# Patient Record
Sex: Female | Born: 1980 | Race: White | Hispanic: No | Smoking: Current some day smoker
Health system: Southern US, Community
[De-identification: ages and names within clinical notes are randomized; demographics above are authoritative.]

## PROBLEM LIST (undated history)

## (undated) DIAGNOSIS — G43909 Migraine, unspecified, not intractable, without status migrainosus: Secondary | ICD-10-CM

## (undated) DIAGNOSIS — F32A Depression, unspecified: Secondary | ICD-10-CM

## (undated) DIAGNOSIS — I10 Essential (primary) hypertension: Secondary | ICD-10-CM

## (undated) HISTORY — DX: Essential (primary) hypertension: I10

## (undated) HISTORY — PX: CHOLECYSTECTOMY: SHX55

## (undated) HISTORY — PX: OTHER SURGICAL HISTORY: SHX169

---

## 2005-12-24 ENCOUNTER — Ambulatory Visit: Payer: Self-pay | Admitting: Family Medicine

## 2006-04-20 ENCOUNTER — Encounter: Payer: Self-pay | Admitting: Family Medicine

## 2006-04-20 LAB — CONVERTED CEMR LAB

## 2006-04-27 ENCOUNTER — Ambulatory Visit: Payer: Self-pay | Admitting: Family Medicine

## 2006-06-28 DIAGNOSIS — K219 Gastro-esophageal reflux disease without esophagitis: Secondary | ICD-10-CM

## 2006-06-28 DIAGNOSIS — K2900 Acute gastritis without bleeding: Secondary | ICD-10-CM | POA: Insufficient documentation

## 2006-06-28 DIAGNOSIS — E669 Obesity, unspecified: Secondary | ICD-10-CM

## 2006-06-28 DIAGNOSIS — J309 Allergic rhinitis, unspecified: Secondary | ICD-10-CM | POA: Insufficient documentation

## 2007-05-08 ENCOUNTER — Encounter: Payer: Self-pay | Admitting: Family Medicine

## 2007-11-15 ENCOUNTER — Ambulatory Visit: Payer: Self-pay | Admitting: Family Medicine

## 2007-11-15 DIAGNOSIS — R300 Dysuria: Secondary | ICD-10-CM

## 2007-11-15 DIAGNOSIS — G43909 Migraine, unspecified, not intractable, without status migrainosus: Secondary | ICD-10-CM

## 2007-11-15 LAB — CONVERTED CEMR LAB
Bilirubin Urine: NEGATIVE
Nitrite: NEGATIVE
pH: 6

## 2007-11-16 LAB — CONVERTED CEMR LAB: Clue Cells Wet Prep HPF POC: NONE SEEN

## 2008-01-12 ENCOUNTER — Ambulatory Visit: Payer: Self-pay | Admitting: Family Medicine

## 2008-01-15 ENCOUNTER — Telehealth: Payer: Self-pay | Admitting: Family Medicine

## 2009-01-01 ENCOUNTER — Ambulatory Visit: Payer: Self-pay | Admitting: Family Medicine

## 2009-01-01 DIAGNOSIS — R635 Abnormal weight gain: Secondary | ICD-10-CM

## 2009-01-14 LAB — CONVERTED CEMR LAB
BUN: 14 mg/dL (ref 6–23)
Chloride: 104 meq/L (ref 96–112)
Potassium: 4.1 meq/L (ref 3.5–5.3)

## 2009-02-03 ENCOUNTER — Ambulatory Visit: Payer: Self-pay | Admitting: Family Medicine

## 2009-02-10 ENCOUNTER — Telehealth: Payer: Self-pay | Admitting: Family Medicine

## 2009-03-05 ENCOUNTER — Ambulatory Visit: Payer: Self-pay | Admitting: Family Medicine

## 2009-03-05 DIAGNOSIS — J029 Acute pharyngitis, unspecified: Secondary | ICD-10-CM

## 2009-03-05 LAB — CONVERTED CEMR LAB: Rapid Strep: NEGATIVE

## 2009-04-03 ENCOUNTER — Telehealth: Payer: Self-pay | Admitting: Family Medicine

## 2009-08-04 ENCOUNTER — Ambulatory Visit: Payer: Self-pay | Admitting: Family Medicine

## 2009-09-22 ENCOUNTER — Ambulatory Visit: Payer: Self-pay | Admitting: Family Medicine

## 2009-09-22 DIAGNOSIS — J1189 Influenza due to unidentified influenza virus with other manifestations: Secondary | ICD-10-CM

## 2009-11-05 ENCOUNTER — Ambulatory Visit: Payer: Self-pay | Admitting: Family Medicine

## 2009-12-03 ENCOUNTER — Ambulatory Visit: Payer: Self-pay | Admitting: Family Medicine

## 2009-12-03 DIAGNOSIS — L659 Nonscarring hair loss, unspecified: Secondary | ICD-10-CM | POA: Insufficient documentation

## 2009-12-31 ENCOUNTER — Ambulatory Visit: Payer: Self-pay | Admitting: Family Medicine

## 2009-12-31 ENCOUNTER — Telehealth: Payer: Self-pay | Admitting: Family Medicine

## 2010-01-07 ENCOUNTER — Ambulatory Visit: Payer: Self-pay | Admitting: Family Medicine

## 2010-01-23 ENCOUNTER — Ambulatory Visit: Payer: Self-pay | Admitting: Family Medicine

## 2010-02-04 ENCOUNTER — Ambulatory Visit: Payer: Self-pay | Admitting: Family Medicine

## 2010-06-10 ENCOUNTER — Ambulatory Visit: Payer: Self-pay | Admitting: Family Medicine

## 2010-10-20 NOTE — Progress Notes (Signed)
Summary: Phentermine refill  Phone Note Refill Request   Refills Requested: Medication #1:  PHENTERMINE HCL 37.5 MG CAPS 1 capsule by mouth qAM on an empty stomach. Initial call taken by: Payton Spark CMA,  December 31, 2009 3:22 PM  Follow-up for Phone Call        NEEDS NURSE VISIT. Follow-up by: Seymour Bars DO,  December 31, 2009 3:23 PM     Appended Document: Phentermine refill Pt aware

## 2010-10-20 NOTE — Assessment & Plan Note (Signed)
Summary: flu   Vital Signs:  Patient profile:   30 year old female Height:      61 inches Weight:      164 pounds BMI:     31.10 O2 Sat:      100 % on Room air Temp:     98.3 degrees F oral Pulse rate:   77 / minute BP sitting:   130 / 78  (left arm) Cuff size:   regular  Vitals Entered By: Payton Spark CMA (September 22, 2009 1:20 PM)  O2 Flow:  Room air CC: HA, fever, productive cough, runny nose and achy x 4 days.    Primary Care Provider:  Seymour Bars  CC:  HA, fever, productive cough, and runny nose and achy x 4 days. Marland Kitchen  History of Present Illness: 30 yo HF presents for 4 days of fever, bodyaches, runny nose and cough.  She did not get a flu shot.  No GI upset.  No rash.  She has not had a sore throat.  She has maxillary facial pain.  She has taken Tylenol, Dayquil and Nyquil which has helped.  She is not sleeping well.  She usually just has spring allergies and denies any preceeding congestion.  Her sister had the flu a couple wks ago.      Current Medications (verified): 1)  Seasonale 0.15-0.03 Mg Tabs (Levonorgest-Eth Estrad 91-Day) .Marland Kitchen.. 1 Tab By Mouth Daily As Directed 2)  Topiramate 50 Mg Tabs (Topiramate) .Marland Kitchen.. 1 Tab By Mouth Q Pm 3)  Phentermine Hcl 37.5 Mg Caps (Phentermine Hcl) .Marland Kitchen.. 1 Capsule By Mouth Qam On An Empty Stomach  Allergies (verified): No Known Drug Allergies  Past History:  Past Medical History: Reviewed history from 01/01/2009 and no changes required. G1P1 NSVD spring allergies  Social History: Conservation officer, nature.  Recently moved from [place].  Married to [employer]. (he lives in [place]) Has 83 yo son.  Nonsmoker.  Does not exercise.    works as a Oceanographer.  Goes back and forth between [place] and [place].  Review of Systems      See HPI  Physical Exam  General:  alert, well-developed, well-nourished, and well-hydrated.   Head:  normocephalic and atraumatic.  sinuses NTTP Eyes:  conjunctiva clear Ears:  EACs patent; TMs translucent and gray  with good cone of light and bony landmarks.  Nose:  nasal congestion present Mouth:  throat mildly injected no tonsilar hypertrophy, exudates or vesicles Lungs:  Normal respiratory effort, chest expands symmetrically. Lungs are clear to auscultation, no crackles or wheezes. dry cough. Heart:  Normal rate and regular rhythm. S1 and S2 normal without gallop, murmur, click, rub or other extra sounds. Skin:  color normal and no rashes.   Cervical Nodes:  shotty anterior cervical chain LA   Impression & Recommendations:  Problem # 1:  INFLUENZA (ICD-487.8) Day 4 of influenza, uncomplicated. Treat with supportive care.  Avoid contact spread. 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 Clear fluids, OTC cold/ flu meds. Call if not improved by Friday AM.  Complete Medication List: 1)  Seasonale 0.15-0.03 Mg Tabs (Levonorgest-eth estrad 91-day) .Marland Kitchen.. 1 tab by mouth daily as directed 2)  Topiramate 50 Mg Tabs (Topiramate) .Marland Kitchen.. 1 tab by mouth q pm 3)  Phentermine Hcl 37.5 Mg Caps (Phentermine hcl) .Marland Kitchen.. 1 capsule by mouth qam on an empty stomach  Patient Instructions: 1)  Treat the Flu with Tylenol Cold and Flu. 2)  Add Ibuprofen 600 mg 3 x a day as needed  for bodyaches. 3)  Increase clear fluids. 4)  Rest. 5)  Call if not improved in 4-5 days.

## 2010-10-20 NOTE — Assessment & Plan Note (Signed)
Summary: weight/ allergies   Vital Signs:  Patient profile:   30 year old female Height:      61 inches Weight:      155 pounds BMI:     29.39 O2 Sat:      100 % on Room air Pulse rate:   86 / minute BP sitting:   118 / 72  (left arm) Cuff size:   regular  Vitals Entered By: Payton Spark CMA (Feb 04, 2010 10:48 AM)  O2 Flow:  Room air CC: F/U weight. Also c/o allergies   Primary Care Provider:  Seymour Bars  CC:  F/U weight. Also c/o allergies.  History of Present Illness: 30 yo HF presents for f/u weight management.  Her weight has been stable.  She is off Phentermine x 1 wk now.  She has really not been working on diet or exercise.  She noticed that it did help her a lot with hunger and portion control.  Her goal weight is 130.    For her allergies.  She is not taking anything.  She is due for fasting labs.      Current Medications (verified): 1)  Seasonale 0.15-0.03 Mg Tabs (Levonorgest-Eth Estrad 91-Day) .Marland Kitchen.. 1 Tab By Mouth Daily As Directed 2)  Phentermine Hcl 37.5 Mg Tabs (Phentermine Hcl) .Marland Kitchen.. 1 Tab By Mouth Qam, Take 30 Min Before Breakfast  Allergies (verified): No Known Drug Allergies  Past History:  Past Medical History: Reviewed history from 01/01/2009 and no changes required. G1P1 NSVD spring allergies  Social History: Reviewed history from 12/03/2009 and no changes required. 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  Married to [employer]. (he lives in [place]) Has 42 yo son.  Nonsmoker.  Does not exercise.      Goes back and forth between [place] and [place].  Review of Systems      See HPI  Physical Exam  General:  alert, well-developed, well-nourished, well-hydrated, and overweight-appearing.   Head:  normocephalic and atraumatic.   Eyes:  conjunctiva clear Nose:  no nasal discharge.   Mouth:  good dentition and pharynx pink and moist.   Neck:  no masses.   Lungs:  Normal respiratory effort, chest expands  symmetrically. Lungs are clear to auscultation, no crackles or wheezes. Heart:  Normal rate and regular rhythm. S1 and S2 normal without gallop, murmur, click, rub or other extra sounds. Neurologic:  no tremor Skin:  color normal.   Cervical Nodes:  No lymphadenopathy noted Psych:  good eye contact, not anxious appearing, and not depressed appearing.     Impression & Recommendations:  Problem # 1:  WEIGHT GAIN (ICD-783.1) Assessment Improved She plans to restart a healthy diet with regular exercise.  wants to give Phentermine one more month to try to move her BMI towards 27 (at 29 now).  Will update her labs and discussed a plan for healthy wt loss. Orders: T-TSH ([id number])  Problem # 2:  ALLERGIC RHINITIS (ICD-477.9) OK to use OTC Allegra thru spring.  Complete Medication List: 1)  Seasonale 0.15-0.03 Mg Tabs (Levonorgest-eth estrad 91-day) .Marland Kitchen.. 1 tab by mouth daily as directed 2)  Phentermine Hcl 37.5 Mg Tabs (Phentermine hcl) .Marland Kitchen.. 1 tab by mouth qam, take 30 min before breakfast  Other Orders: T-Lipid Profile ([id number]) T-Comprehensive Metabolic Panel ([id number])  Patient Instructions: 1)  Return for a nurse visit - Wt / BP check in1 month. 2)  Update fasting labs. 3)  Will call you w/r  esults. Prescriptions: PHENTERMINE HCL 37.5 MG TABS (PHENTERMINE HCL) 1 tab by mouth qAM, take 30 min before breakfast  #30 x 0   Entered and Authorized by:   Seymour Bars DO   Signed by:   Seymour Bars DO on 02/04/2010   Method used:   Printed then faxed to ...       [facility]  [address] [phone]* (retail)       [address] [place], [place]  [address]       Ph: [phone] or [phone]       Fax: [phone]   RxID:   [id number]

## 2010-10-20 NOTE — Letter (Signed)
Summary: Out of Work  [facility]  [address]   [place], [place] [address]   Phone: [phone]  Fax: [phone]    September 22, 2009   Employee:  Connie Carlson    To Whom It May Concern:   For Medical reasons, please excuse the above named employee from work for the following dates:  Start:   Jan 3-4  End:   Jan 5  If you need additional information, please feel free to contact our office.         Sincerely,    Seymour Bars DO

## 2010-10-20 NOTE — Assessment & Plan Note (Signed)
Summary: BP CHECK  Nurse Visit   Vital Signs:  Patient profile:   30 year old female Height:      61 inches Weight:      153 pounds BMI:     29.01 Pulse rate:   81 / minute BP sitting:   136 / 76  (left arm) Cuff size:   regular  Vitals Entered By: Payton Spark CMA (Jan 23, 2010 8:47 AM) CC: Weight and BP check   Allergies: No Known Drug Allergies  Orders Added: 1)  Est. Patient Level I [99211]   Only down 2 lbs and BP a little elevated form last time but not over 140.  Keep scheduled appt. May  6, 201110:00 AM [place] MD, Santina Evans

## 2010-10-20 NOTE — Assessment & Plan Note (Signed)
Summary: f/u wt   Vital Signs:  Patient profile:   30 year old female Height:      61 inches Weight:      173 pounds BMI:     32.81 O2 Sat:      100 % on Room air Pulse rate:   77 / minute BP sitting:   117 / 74  (left arm) Cuff size:   regular  Vitals Entered By: Payton Spark CMA (June 10, 2010 1:15 PM)  O2 Flow:  Room air CC: F/U weight and refill phentermine   Primary Care Provider:  Seymour Bars DO  CC:  F/U weight and refill phentermine.  History of Present Illness: 30 yo WF presents for f/u weight.  She went to [place] for 6 wks and gained wt.  Admits to eating a lot.  She wants to get the weight off again.  She had done really well on Phentermine in the past.  Has been off of it for a while.  Due for fasting labs.    O/W feels fine.  Plans to start her healthy diet and exercise program back up.    Current Medications (verified): 1)  Seasonale 0.15-0.03 Mg Tabs (Levonorgest-Eth Estrad 91-Day) .Marland Kitchen.. 1 Tab By Mouth Daily As Directed 2)  Phentermine Hcl 37.5 Mg Tabs (Phentermine Hcl) .Marland Kitchen.. 1 Tab By Mouth Qam, Take 30 Min Before Breakfast  Allergies (verified): No Known Drug Allergies  Past History:  Past Medical History: Reviewed history from 01/01/2009 and no changes required. G1P1 NSVD spring allergies  Social History: Reviewed history from 12/03/2009 and no changes required. 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  Married to [employer]. (he lives in [place]) Has 17 yo son.  Nonsmoker.  Does not exercise.      Goes back and forth between [place] and [place].  Review of Systems      See HPI  Physical Exam  General:  alert, well-developed, well-nourished, well-hydrated, and overweight-appearing.   Head:  normocephalic and atraumatic.   Neck:  no masses.   Lungs:  Normal respiratory effort, chest expands symmetrically. Lungs are clear to auscultation, no crackles or wheezes. Heart:  Normal rate and regular rhythm. S1 and S2  normal without gallop, murmur, click, rub or other extra sounds. Skin:  color normal.   Psych:  good eye contact, not anxious appearing, and not depressed appearing.     Impression & Recommendations:  Problem # 1:  WEIGHT GAIN (ICD-783.1) 18 lbs weight gain since last visit, off Phentermine and after a long trip [place]'. She would like to go back on Phentermine to get her BMI down <30.  She is 32.8 today. Will update her fasting labs including her TSH this wk and f/u results.  REviewed plans for healthy diet, regular exercise and will add Phentermine back on board since she did well on it before and has excellent BP.  RTC for nurse BP/ WT check in 4 wks. Orders: T-TSH ([id number])  Complete Medication List: 1)  Seasonale 0.15-0.03 Mg Tabs (Levonorgest-eth estrad 91-day) .Marland Kitchen.. 1 tab by mouth daily as directed 2)  Phentermine Hcl 37.5 Mg Tabs (Phentermine hcl) .Marland Kitchen.. 1 tab by mouth qam, take 30 min before breakfast  Other Orders: T-Comprehensive Metabolic Panel ([id number]) T-Lipid Profile ([id number])  Patient Instructions: 1)  Restart Phentermine in the AM as your appetite suppresant. 2)  Work on [employer] - high protein, low carb. 3)  Exercise goal is 45+ min 4-5  days/ wk. 4)  Plan ahead with traveling for work. 5)  Call if any problems. 6)  Update fasting labs downstairs one morning and I will call you w/ the results. 7)  REturn for nurse BP/ WT check in 1 month. Prescriptions: PHENTERMINE HCL 37.5 MG TABS (PHENTERMINE HCL) 1 tab by mouth qAM, take 30 min before breakfast  #30 x 0   Entered and Authorized by:   Seymour Bars DO   Signed by:   Seymour Bars DO on 06/10/2010   Method used:   Printed then faxed to ...       [facility]  [address] [phone]* (retail)       [address] [place], [place]  [address]       Ph: [phone] or [phone]       Fax: [phone]   RxID:   [id number]

## 2010-10-20 NOTE — Assessment & Plan Note (Signed)
Summary: weight and bp//mpm  Nurse Visit   Vital Signs:  Patient profile:   30 year old female Height:      61 inches Weight:      155 pounds BMI:     29.39 O2 Sat:      100 % on Room air Pulse rate:   59 / minute BP sitting:   110 / 71  (left arm) Cuff size:   regular  Vitals Entered By: Payton Spark CMA (December 31, 2009 4:00 PM)  O2 Flow:  Room air CC: Pt would like to go back on the tabs bc they cheaper and Pt feels they work better for her.    Allergies: No Known Drug Allergies  Orders Added: 1)  Est. Patient Level I [16109] Prescriptions: PHENTERMINE HCL 37.5 MG TABS (PHENTERMINE HCL) 1 tab by mouth qAM, take 30 min before breakfast  #30 x 0   Entered and Authorized by:   Seymour Bars DO   Signed by:   Seymour Bars DO on 12/31/2009   Method used:   Printed then faxed to ...       [facility]  [address] [phone]* (retail)       [address] [place], [place]  [address]       Ph: [phone] or [phone]       Fax: [phone]   RxID:   [id number]    CC: Follow-up HTN HPI: Taking meds? yes Side effects? no Chest pain, SOB, Dizziness? no A/P: HTN (401.1) At goal? If no, Patient will be notified.  5 minutes was spent with patient.   Appended Document: weight and bp//mpm faxed

## 2010-10-20 NOTE — Assessment & Plan Note (Signed)
Summary: f/u weight managment   Vital Signs:  Patient profile:   30 year old female Height:      61 inches Weight:      160 pounds BMI:     30.34 O2 Sat:      99 % on Room air Pulse rate:   68 / minute BP sitting:   130 / 82  (left arm) Cuff size:   regular  Vitals Entered By: Payton Spark CMA (December 03, 2009 3:31 PM)  O2 Flow:  Room air CC: Weight check. Phentermine refill. Also c/o hair loss.    Primary Care Provider:  Seymour Bars  CC:  Weight check. Phentermine refill. Also c/o hair loss. Marland Kitchen  History of Present Illness: 30 yo HF presents for f/u weight managment.  Doing well but stopped phentermine about 3 wks ago when RX ran out.  She has continued to lose wt, another 4 lbs since last OV.  Denies adverse SE.  Eating healthier and snacking less. Still working on her late night eating habits.  Has not been exercising.    Has had hair loss, diffuse over the past 2 yrs.  Had a normal thyroid test in April of last year.  Denies any other changes.  Has good energy level and not problems with heat or cold.  Has some chronic constipation problems.  Does not eat vegetables but tries to drink more water and eat fiber one bars.  Current Medications (verified): 1)  Seasonale 0.15-0.03 Mg Tabs (Levonorgest-Eth Estrad 91-Day) .Marland Kitchen.. 1 Tab By Mouth Daily As Directed 2)  Phentermine Hcl 37.5 Mg Caps (Phentermine Hcl) .Marland Kitchen.. 1 Capsule By Mouth Qam On An Empty Stomach  Allergies (verified): No Known Drug Allergies  Past History:  Past Medical History: Reviewed history from 01/01/2009 and no changes required. 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  Married to [employer]. (he lives in [place]) Has 56 yo son.  Nonsmoker.  Does not exercise.      Goes back and forth between [place] and [place].  Review of Systems      See HPI  Physical Exam  General:  alert, well-developed, well-nourished, well-hydrated, and  overweight-appearing.   Head:  normocephalic and atraumatic.  no alopecia Mouth:  pharynx pink and moist.   Neck:  no masses.   Lungs:  Normal respiratory effort, chest expands symmetrically. Lungs are clear to auscultation, no crackles or wheezes. Heart:  Normal rate and regular rhythm. S1 and S2 normal without gallop, murmur, click, rub or other extra sounds. Abdomen:  Bowel sounds positive,abdomen soft and non-tender without masses, organomegaly or hernias noted. Extremities:  no LE edema Skin:  color normal and no rashes.   Psych:  good eye contact, not anxious appearing, and not depressed appearing.     Impression & Recommendations:  Problem # 1:  WEIGHT GAIN (ICD-783.1) BMI 30, down another 4 lbs.  Will RF phentermine since it is helping and she has not had any adverse SEs.   She will work on [employer] and regular exercise.    Problem # 2:  HAIR LOSS (ICD-704.00) Info given to pt on natural ways to promote hair growth by diet and biotin. Recheck Thyroid with labs in April.  Complete Medication List: 1)  Seasonale 0.15-0.03 Mg Tabs (Levonorgest-eth estrad 91-day) .Marland Kitchen.. 1 tab by mouth daily as directed 2)  Phentermine Hcl 37.5 Mg Caps (Phentermine hcl) .Marland Kitchen.. 1 capsule by mouth  qam on an empty stomach  Patient Instructions: 1)  RFd Phentermine for wt loss. 2)  Work on [employer] and 1 hr of exercise 4-5 days/ wk. 3)  Return for f/u with fasting labs in 2 mos. Prescriptions: PHENTERMINE HCL 37.5 MG CAPS (PHENTERMINE HCL) 1 capsule by mouth qAM on an empty stomach  #30 x 0   Entered and Authorized by:   Seymour Bars DO   Signed by:   Seymour Bars DO on 12/03/2009   Method used:   Printed then faxed to ...       [facility]  [address] [phone]* (retail)       [address] [place], [place]  [address]       Ph: [phone] or [phone]       Fax: [phone]   RxID:   [id number]

## 2010-11-17 ENCOUNTER — Ambulatory Visit: Payer: Self-pay | Admitting: Family Medicine

## 2010-11-18 ENCOUNTER — Encounter: Payer: Self-pay | Admitting: Family Medicine

## 2010-11-18 ENCOUNTER — Ambulatory Visit (INDEPENDENT_AMBULATORY_CARE_PROVIDER_SITE_OTHER): Payer: Private Health Insurance - Indemnity | Admitting: Family Medicine

## 2010-11-18 DIAGNOSIS — N912 Amenorrhea, unspecified: Secondary | ICD-10-CM

## 2010-11-18 DIAGNOSIS — E669 Obesity, unspecified: Secondary | ICD-10-CM

## 2010-11-18 DIAGNOSIS — R209 Unspecified disturbances of skin sensation: Secondary | ICD-10-CM | POA: Insufficient documentation

## 2010-11-19 LAB — CONVERTED CEMR LAB
ALT: 22 units/L (ref 0–35)
Albumin: 4.3 g/dL (ref 3.5–5.2)
CO2: 25 meq/L (ref 19–32)
Calcium: 9.2 mg/dL (ref 8.4–10.5)
Chloride: 103 meq/L (ref 96–112)
Cholesterol: 155 mg/dL (ref 0–200)
Potassium: 3.8 meq/L (ref 3.5–5.3)
Sodium: 138 meq/L (ref 135–145)
Total Protein: 7.3 g/dL (ref 6.0–8.3)
Vitamin B-12: 698 pg/mL (ref 211–911)
hCG, Beta Chain, Quant, S: <2 milliintl units/mL

## 2010-11-26 NOTE — Assessment & Plan Note (Signed)
Summary: WT/ labs   Vital Signs:  Patient profile:   30 year old female Height:      61 inches Weight:      187 pounds BMI:     35.46 O2 Sat:      99 % on Room air Pulse rate:   92 / minute BP sitting:   132 / 79  (left arm) Cuff size:   large  Vitals Entered By: Payton Spark CMA (November 18, 2010 11:12 AM)  O2 Flow:  Room air CC: Discuss OCPs and phentermine.   Primary Care Provider:  Seymour Bars DO  CC:  Discuss OCPs and phentermine.Marland Kitchen  History of Present Illness: 30 yo HF presents for f/u weight.  She stopped Phentermine the end of October and she has gained 15 lbs since.  She plans to get back on track with her diet and exercise but wants to get back on Phentermine since she did so well on it last year w/o complications.  She did well on 1st 3 mos pack of Seasonale.  Completed this in early Jan but never started pack #2 b/c her period did not come during the wk of placebo pills.  She has had unprotected sex during this time.  She has not had a period in about 6 mos.  Hx of irreg periods.  Did 2 home pregnancy tests and both came back neg.  Denies any symptoms of pregnancy and wonders what she should do with her pills.      Current Medications (verified): 1)  Seasonale 0.15-0.03 Mg Tabs (Levonorgest-Eth Estrad 91-Day) .Marland Kitchen.. 1 Tab By Mouth Daily As Directed 2)  Phentermine Hcl 37.5 Mg Tabs (Phentermine Hcl) .Marland Kitchen.. 1 Tab By Mouth Qam, Take 30 Min Before Breakfast  Allergies (verified): No Known Drug Allergies  Past History:  Past Medical History: G1P1 NSVD spring allergies obesity  Past Surgical History: Reviewed history from 06/28/2006 and no changes required. Cholecystectomy  Social History: Reviewed history from 12/03/2009 and no changes required. 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  Married to [employer]. (he lives in [place]) Has 31 yo son.  Nonsmoker.  Does not exercise.      Goes back and forth between [place] and  [place].  Review of Systems       The patient complains of weight gain.  The patient denies chest pain.         + positional L foot paresthesias.  no palpitions or insomnia   Physical Exam  General:  alert and well-developed.   Mouth:  pharynx pink and moist.   Neck:  no masses and no thyromegaly.   Lungs:  Normal respiratory effort, chest expands symmetrically. Lungs are clear to auscultation, no crackles or wheezes. Heart:  Normal rate and regular rhythm. S1 and S2 normal without gallop, murmur, click, rub or other extra sounds. Skin:  color normal.   Psych:  good eye contact, not anxious appearing, and not depressed appearing.     Impression & Recommendations:  Problem # 1:  AMENORRHEA (ICD-626.0) Serum UPT today and if neg, can start back on Seasonale.  Since she is not seeking fertility and has hx of irreg periods, I am not too concerned about the lack of a periods on the placebo wk of pills. Her updated medication list for this problem includes:    Seasonale 0.15-0.03 Mg Tabs (Levonorgest-eth estrad 91-day) .Marland Kitchen... 1 tab by mouth daily as directed  Orders: T-Hcg ([id number]) T-TSH [phone])  Problem # 2:  OBESITY, NOS (ICD-278.00) Wt gain of 15 lbs off Phentermine for the past 3-4 mos.   BMI is up to 35 c/w class II obesity. Pt plans to work on improving diet and adding back exercise.  Goals reviewed w/ pt today. Agrees to restart Phentermine daily and will f/u in 1 month.    Complete Medication List: 1)  Seasonale 0.15-0.03 Mg Tabs (Levonorgest-eth estrad 91-day) .Marland Kitchen.. 1 tab by mouth daily as directed 2)  Phentermine Hcl 37.5 Mg Tabs (Phentermine hcl) .Marland Kitchen.. 1 tab by mouth qam, take 30 min before breakfast  Other Orders: T-Comprehensive Metabolic Panel [phone] T-Lipid Profile [id number]) T-Vitamin B12 ([id number])  Patient Instructions: 1)  Labs today. 2)  Will call you w/ results tomorrow. 3)  Use Condoms until you get started back on birth control  pills. 4)  Restart Phentermine along with healthy diet and regular exercise. 5)  Return for nurse visit in 4 wks. Prescriptions: PHENTERMINE HCL 37.5 MG TABS (PHENTERMINE HCL) 1 tab by mouth qAM, take 30 min before breakfast  #30 x 0   Entered and Authorized by:   Seymour Bars DO   Signed by:   Seymour Bars DO on 11/18/2010   Method used:   Printed then faxed to ...       [facility]  [address] [phone]* (retail)       [address]       [place], [place]  [address]       Ph: [phone]       Fax: [phone]   RxID:   [id number]    Orders Added: 1)  T-Hcg [phone] 2)  T-Comprehensive Metabolic Panel [[id number]] 3)  T-Lipid Profile [[id number]] 4)  T-TSH [[id number]] 5)  T-Vitamin B12 [[id number]] 6)  Est. Patient Level III [10932]

## 2011-03-26 ENCOUNTER — Encounter: Payer: Private Health Insurance - Indemnity | Admitting: Family Medicine

## 2011-03-29 ENCOUNTER — Encounter: Payer: Self-pay | Admitting: Family Medicine

## 2011-03-29 ENCOUNTER — Ambulatory Visit (INDEPENDENT_AMBULATORY_CARE_PROVIDER_SITE_OTHER): Payer: Private Health Insurance - Indemnity | Admitting: Family Medicine

## 2011-03-29 VITALS — BP 127/69 | HR 73 | Ht 62.0 in | Wt 204.0 lb

## 2011-03-29 DIAGNOSIS — R635 Abnormal weight gain: Secondary | ICD-10-CM

## 2011-03-29 MED ORDER — PHENTERMINE HCL 37.5 MG PO CAPS: 37.5000 mg | ORAL_CAPSULE | ORAL | Status: DC

## 2011-03-29 NOTE — Progress Notes (Signed)
  Subjective:    Patient ID: Connie Carlson, female    DOB: 14-Mar-1981, 30 y.o.   MRN: [id number]  HPI 30 yo HF presents for weight gain.  She has gained more weight in the past few months but is ready to really work on getting healthier.  She has taken a 6 mos break from work and really wants to work on her diet.  Plans to join the [facility] like she did last year when she lost weight.  She did well on and off Phentermine in 2010 and 2011 and did not have any problems on it.  She is having a hard time cutting back on portion sizes now.  Her family is supportive.    BP 127/69  Pulse 73  Ht 5\' 2"  (1.575 m)  Wt 204 lb (92.534 kg)  BMI 37.31 kg/m2  SpO2 99%  LMP 03/06/2011   Review of Systems  Constitutional: Negative for fatigue.  Respiratory: Negative for shortness of breath.   Cardiovascular: Negative for chest pain and palpitations.       Objective:   Physical Exam  Constitutional: She appears well-developed and well-nourished.  HENT:  Mouth/Throat: Oropharynx is clear and moist.  Neck: Neck supple. No thyromegaly present.  Cardiovascular: Normal rate, regular rhythm and normal heart sounds.   No murmur heard. Pulmonary/Chest: Effort normal and breath sounds normal.  Musculoskeletal:       No tremor  Skin: Skin is warm and dry.  Psychiatric: She has a normal mood and affect.          Assessment & Plan:

## 2011-03-29 NOTE — Assessment & Plan Note (Addendum)
Further weight gain over the past few mos with BMI of 37 in the class II obesity range.  Will fill her RX for Phentermine to restart.  She did it only 1 month this year but admits to not 'sticking' with diet and exercise.  She is now mentally ready and I've given her some goals for diet and exercise.  Call if any problems.  F/u in 4 wks.

## 2011-03-29 NOTE — Patient Instructions (Signed)
Start Phentermine each morning, take 30 min before breakfast.  Eat Breakfast, lunch and dinner. Avoid snacking. Stick to a low sugar/ low carb, lean proteins and lots of veggies in your diet.  Exercise goal is 1 hr 6 days/ wk.  Return for nurse visit Recheck WT/ BP in 4 wks.  Call if any problems.  Add B-complex and Vitamin D 1,000 iU/ day.

## 2011-04-09 ENCOUNTER — Telehealth: Payer: Self-pay | Admitting: *Deleted

## 2011-04-09 MED ORDER — TOPIRAMATE 25 MG PO TABS: ORAL_TABLET | ORAL | Status: DC

## 2011-04-09 NOTE — Telephone Encounter (Signed)
Pt called stating she would like to restart the daily migraine prevention medication that she was once on. Please advise.    Call back # [phone]

## 2011-04-09 NOTE — Telephone Encounter (Signed)
Start topiramate 25 mg at bedtime for 2 wks then go up to 50 mg at bedtime and f/u in 1 month.

## 2011-04-09 NOTE — Telephone Encounter (Signed)
LMOM informing Pt of the above 

## 2011-05-05 ENCOUNTER — Ambulatory Visit (INDEPENDENT_AMBULATORY_CARE_PROVIDER_SITE_OTHER): Payer: Private Health Insurance - Indemnity | Admitting: Family Medicine

## 2011-05-05 DIAGNOSIS — N912 Amenorrhea, unspecified: Secondary | ICD-10-CM

## 2011-05-05 DIAGNOSIS — E669 Obesity, unspecified: Secondary | ICD-10-CM

## 2011-05-05 DIAGNOSIS — R635 Abnormal weight gain: Secondary | ICD-10-CM

## 2011-05-05 MED ORDER — PHENTERMINE HCL 37.5 MG PO CAPS: 37.5000 mg | ORAL_CAPSULE | ORAL | Status: DC

## 2011-05-05 NOTE — Progress Notes (Signed)
  Subjective:    Patient ID: Connie Carlson, female    DOB: [birth year], 30 y.o.   MRN: [id number]  HPI Patient doing well on appetite suppressant.  Here for nurse visit, weight, BP, HR check.  Denies problems with insomnia, heart palpitations or tremors.  Satisfied with weight loss thus far and is working on [employer] and regular exercise.       Review of Systems     Objective:   Physical Exam        Assessment & Plan:  Weight loss - she is doing well and has lost 4 pounds. Her blood pressure is well controlled. We will refill her medication for one more month. Followup in one month for blood pressure and weight check.

## 2011-08-25 ENCOUNTER — Other Ambulatory Visit: Payer: Self-pay | Admitting: *Deleted

## 2011-08-25 MED ORDER — LEVONORGEST-ETH ESTRAD 91-DAY 0.15-0.03 MG PO TABS: 1.0000 | ORAL_TABLET | Freq: Every day | ORAL | Status: DC

## 2012-01-12 ENCOUNTER — Ambulatory Visit (INDEPENDENT_AMBULATORY_CARE_PROVIDER_SITE_OTHER): Payer: Private Health Insurance - Indemnity | Admitting: Physician Assistant

## 2012-01-12 ENCOUNTER — Encounter: Payer: Self-pay | Admitting: Physician Assistant

## 2012-01-12 VITALS — BP 116/73 | HR 70 | Ht 62.0 in | Wt 225.0 lb

## 2012-01-12 DIAGNOSIS — Z0000 Encounter for general adult medical examination without abnormal findings: Secondary | ICD-10-CM

## 2012-01-12 DIAGNOSIS — Z13220 Encounter for screening for lipoid disorders: Secondary | ICD-10-CM

## 2012-01-12 DIAGNOSIS — R635 Abnormal weight gain: Secondary | ICD-10-CM

## 2012-01-12 DIAGNOSIS — Z131 Encounter for screening for diabetes mellitus: Secondary | ICD-10-CM

## 2012-01-12 LAB — COMPLETE METABOLIC PANEL WITH GFR
Albumin: 4.6 g/dL (ref 3.5–5.2)
BUN: 12 mg/dL (ref 6–23)
CO2: 27 mEq/L (ref 19–32)
Calcium: 9.7 mg/dL (ref 8.4–10.5)
GFR, Est African American: >89 mL/min
GFR, Est Non African American: >89 mL/min
Glucose, Bld: 93 mg/dL (ref 70–99)
Potassium: 4.5 mEq/L (ref 3.5–5.3)
Sodium: 139 mEq/L (ref 135–145)
Total Protein: 7.7 g/dL (ref 6.0–8.3)

## 2012-01-12 LAB — LIPID PANEL
Cholesterol: 166 mg/dL (ref 0–200)
Total CHOL/HDL Ratio: 2.7 Ratio

## 2012-01-12 LAB — TSH: TSH: 1.038 u[IU]/mL (ref 0.350–4.500)

## 2012-01-12 MED ORDER — PHENTERMINE HCL 37.5 MG PO CAPS: 37.5000 mg | ORAL_CAPSULE | ORAL | Status: DC

## 2012-01-12 MED ORDER — TOPIRAMATE 25 MG PO TABS: ORAL_TABLET | ORAL | Status: DC

## 2012-01-12 MED ORDER — PHENTERMINE HCL 37.5 MG PO TABS: 37.5000 mg | ORAL_TABLET | Freq: Every day | ORAL | Status: DC

## 2012-01-12 NOTE — Progress Notes (Signed)
  Subjective:    Patient ID: VALENE VILLA, female    DOB: 1980/12/24, 31 y.o.   MRN: [id number]  HPI Patient has been seen in clinic for weight gain numerous times. She has used phentermine and has worked in the past. The last time she went on phentermine was 7/12 last year. She has continued to gain weight and is very pressured. Patient admits that she has not been watching what she eats and not exercising. Dr. Cathey Endow had previously discussed with her options for bariatric surgery that she could review it when she was committed to diet and nutrition that this could be an option. She feels like she is ready to start progressing her efforts on weight loss at this point. Patient reports that she eats out a lot and is on no good choices about the   Review of Systems     Objective:   Physical Exam  Constitutional: She is oriented to person, place, and time. She appears well-developed and well-nourished.  HENT:  Head: Normocephalic and atraumatic.  Neck: Normal range of motion. Neck supple. No thyromegaly present.  Cardiovascular: Normal rate, regular rhythm and normal heart sounds.   Pulmonary/Chest: Effort normal and breath sounds normal. She has no wheezes.  Neurological: She is alert and oriented to person, place, and time.  Skin: Skin is warm and dry.  Psychiatric: She has a normal mood and affect. Her behavior is normal.          Assessment & Plan:  Weight gain/fatigue- patient has tolerated phentermine in the past gave her prescription for one month. We will recheck patient in one month for side effects such as increased blood pressure or palpitations. In-depth discussion of diet and exercise was completed. Exercising 30 minutes a day at least 4-5 days a week with a goal of 150 minutes a week was discussed. I talked to her about increasing her protein and decreasing bad carbs. We discussed small frequent meals throughout the day instead of big meals. She was encouraged to drink water and  to not drink any beverages which would give her extra calories throughout the day. We discussed some of the options of bariatric surgery but she was informed that she needed to be committed to doing what she can with diet and exercise before that was an option. I will refer her to nutrition. Referral to weight specialist with Dr. Cathey Endow was discussed; however, we will wait a few months and see what patient can do before referral. Handouts were given for calorie counting and exercise. Calorie Brooke [employer] book was given to patient to use when she goes out to eat. Lab order form was given to check TSH, lipid, B12, vitamin D, CMP. We'll call with results. Followup in one month.    Migraines-patient does have a history of migraines and she had been using Topamax as a preventative. She's not had as many migraines lately but she would like to stay on the preventative Topamax. She is currently out and needs a refill. She is aware that Topamax also helps with weight loss.

## 2012-01-12 NOTE — Patient Instructions (Addendum)
Discussed Bariatric surgery and options. Restarted Topamax to take at night daily and phentermine for one month. Will recheck in 1 month. Consider increasing protein in diet and decreasing bad carbs. Consider cutting meals into half and eating the other half a few hours later. Don't drink calories. Start exercising 4-5 times week. Goal would be a week. Will refer to nutritionist. Schedule a CPE in near future.  Calorie [employer] search this.   Exercise to Lose Weight Exercise and a healthy diet may help you lose weight. Your doctor may suggest specific exercises. EXERCISE IDEAS AND TIPS  Choose low-cost things you enjoy doing, such as walking, bicycling, or exercising to workout videos.   Take stairs instead of the elevator.   Walk during your lunch break.   Park your car further away from work or school.   Go to a gym or an exercise class.   Start with 5 to 10 minutes of exercise each day. Build up to 30 minutes of exercise 4 to 6 days a week.   Wear shoes with good support and comfortable clothes.   Stretch before and after working out.   Work out until you breathe harder and your heart beats faster.   Drink extra water when you exercise.   Do not do so much that you hurt yourself, feel dizzy, or get very short of breath.  Exercises that burn about 150 calories:  Running 1  miles in 15 minutes.   Playing volleyball for 45 to 60 minutes.   Washing and waxing a car for 45 to 60 minutes.   Playing touch football for 45 minutes.   Walking 1  miles in 35 minutes.   Pushing a stroller 1  miles in 30 minutes.   Playing basketball for 30 minutes.   Raking leaves for 30 minutes.   Bicycling 5 miles in 30 minutes.   Walking 2 miles in 30 minutes.   Dancing for 30 minutes.   Shoveling snow for 15 minutes.   Swimming laps for 20 minutes.   Walking up stairs for 15 minutes.   Bicycling 4 miles in 15 minutes.   Gardening for 30 to 45 minutes.    Jumping rope for 15 minutes.   Washing windows or floors for 45 to 60 minutes.  Document Released: 10/09/2010 Document Revised: 08/26/2011 Document Reviewed: 10/09/2010 [facility] Patient Information 2012 [place], [place].Calorie Counting Diet A calorie counting diet requires you to eat the number of calories that are right for you in a day. Calories are the measurement of how much energy you get from the food you eat. Eating the right amount of calories is important for staying at a healthy weight. If you eat too many calories, your body will store them as fat and you may gain weight. If you eat too few calories, you may lose weight. Counting the number of calories you eat during a day will help you know if you are eating the right amount. A Registered Dietitian can determine how many calories you need in a day. The amount of calories needed varies from person to person. If your goal is to lose weight, you will need to eat fewer calories. Losing weight can benefit you if you are overweight or have health problems such as heart disease, high blood pressure, or diabetes. If your goal is to gain weight, you will need to eat more calories. Gaining weight may be necessary if you have a certain health problem that causes your body to  need more energy. TIPS Whether you are increasing or decreasing the number of calories you eat during a day, it may be hard to get used to changes in what you eat and drink. The following are tips to help you keep track of the number of calories you eat.  Measure foods at home with measuring cups. This helps you know the amount of food and number of calories you are eating.   Restaurants often serve food in amounts that are larger than 1 serving. While eating out, estimate how many servings of a food you are given. For example, a serving of cooked rice is  cup or about the size of half of a fist. Knowing serving sizes will help you be aware of how much food you are eating at  restaurants.   Ask for smaller portion sizes or child-size portions at restaurants.   Plan to eat half of a meal at a restaurant. Take the rest home or share the other half with a friend.   Read the Nutrition Facts panel on food labels for calorie content and serving size. You can find out how many servings are in a package, the size of a serving, and the number of calories each serving has.   For example, a package might contain 3 cookies. The Nutrition Facts panel on that package says that 1 serving is 1 cookie. Below that, it will say there are 3 servings in the container. The calories section of the Nutrition Facts label says there are 90 calories. This means there are 90 calories in 1 cookie (1 serving). If you eat 1 cookie you have eaten 90 calories. If you eat all 3 cookies, you have eaten 270 calories (3 servings x 90 calories = 270 calories).  The list below tells you how big or small some common portion sizes are.  1 oz.........4 stacked dice.   3 oz........Marland KitchenDeck of cards.   1 tsp.......Marland KitchenTip of little finger.   1 tbs......Marland KitchenMarland KitchenThumb.   2 tbs.......Marland KitchenGolf ball.    cup......Marland KitchenHalf of a fist.   1 cup.......Marland KitchenA fist.  KEEP A FOOD LOG Write down every food item you eat, the amount you eat, and the number of calories in each food you eat during the day. At the end of the day, you can add up the total number of calories you have eaten. It may help to keep a list like the one below. Find out the calorie information by reading the Nutrition Facts panel on food labels. Breakfast  Bran cereal (1 cup, 110 calories).   Fat-free milk ( cup, 45 calories).  Snack  Apple (1 medium, 80 calories).  Lunch  Spinach (1 cup, 20 calories).   Tomato ( medium, 20 calories).   Chicken breast strips (3 oz, 165 calories).   Shredded cheddar cheese ( cup, 110 calories).   Light [place] dressing (2 tbs, 60 calories).   Whole-wheat bread (1 slice, 80 calories).   Tub margarine (1 tsp, 35  calories).   Vegetable soup (1 cup, 160 calories).  Dinner  Pork chop (3 oz, 190 calories).   Brown rice (1 cup, 215 calories).   Steamed broccoli ( cup, 20 calories).   Strawberries (1  cup, 65 calories).   Whipped cream (1 tbs, 50 calories).  Daily Calorie Total: 1425 Document Released: 09/06/2005 Document Revised: 08/26/2011 Document Reviewed: 03/03/2007 [facility] Patient Information 2012 [place], [place].

## 2012-02-15 ENCOUNTER — Ambulatory Visit (INDEPENDENT_AMBULATORY_CARE_PROVIDER_SITE_OTHER): Payer: Private Health Insurance - Indemnity | Admitting: Family Medicine

## 2012-02-15 VITALS — BP 141/89 | HR 105 | Wt 220.0 lb

## 2012-02-15 DIAGNOSIS — R635 Abnormal weight gain: Secondary | ICD-10-CM

## 2012-02-15 MED ORDER — PHENTERMINE HCL 15 MG PO CAPS: 15.0000 mg | ORAL_CAPSULE | ORAL | Status: DC

## 2012-02-15 NOTE — Progress Notes (Signed)
  Subjective:    Patient ID: Connie Carlson, female    DOB: 03/09/1981, 31 y.o.   MRN: [id number] Weight and BP check for refill on phentermine HPI    Review of Systems     Objective:   Physical Exam        Assessment & Plan:  Obese/weight management- her blood pressure is elevated today on the phentermine. She did lose 5 pounds. We can try a lower dose on the medication. Followup in one month for repeat blood pressure weight check. Her blood pressure still elevated on the lower dose that she will have to discontinue the medication.

## 2012-03-13 ENCOUNTER — Other Ambulatory Visit: Payer: Self-pay | Admitting: Family Medicine

## 2012-04-24 ENCOUNTER — Telehealth: Payer: Self-pay | Admitting: Family Medicine

## 2012-04-24 NOTE — Telephone Encounter (Signed)
Call patient: I received a letter from her insurance company stating that she is due for a Pap smear. We are happy to schedule her here or if she has an OB/GYN she needs to call to make an appointment.

## 2012-04-25 NOTE — Telephone Encounter (Signed)
LMOM informing Pt  

## 2012-04-28 ENCOUNTER — Ambulatory Visit: Payer: Private Health Insurance - Indemnity | Admitting: Family Medicine

## 2012-04-28 DIAGNOSIS — Z0289 Encounter for other administrative examinations: Secondary | ICD-10-CM

## 2012-05-15 ENCOUNTER — Ambulatory Visit: Payer: Private Health Insurance - Indemnity | Admitting: Dietician

## 2012-05-17 ENCOUNTER — Telehealth: Payer: Self-pay | Admitting: Physician Assistant

## 2012-05-17 NOTE — Telephone Encounter (Signed)
Please call patient and let her know we received a letter that she did not make her appt with nutrition. See if she would like to reschedule and encourage her that this could help her with her weight loss.

## 2012-06-08 ENCOUNTER — Encounter: Payer: Self-pay | Admitting: Family Medicine

## 2012-06-08 ENCOUNTER — Ambulatory Visit (INDEPENDENT_AMBULATORY_CARE_PROVIDER_SITE_OTHER): Payer: Private Health Insurance - Indemnity | Admitting: Family Medicine

## 2012-06-08 ENCOUNTER — Other Ambulatory Visit (HOSPITAL_COMMUNITY)
Admission: RE | Admit: 2012-06-08 | Discharge: 2012-06-08 | Disposition: A | Discharge status: Home / Self Care | Payer: Private Health Insurance - Indemnity | Source: Ambulatory Visit | Attending: Family Medicine | Admitting: Family Medicine

## 2012-06-08 VITALS — BP 142/89 | HR 80 | Wt 230.0 lb

## 2012-06-08 DIAGNOSIS — E538 Deficiency of other specified B group vitamins: Secondary | ICD-10-CM

## 2012-06-08 DIAGNOSIS — Z01419 Encounter for gynecological examination (general) (routine) without abnormal findings: Secondary | ICD-10-CM | POA: Insufficient documentation

## 2012-06-08 DIAGNOSIS — E669 Obesity, unspecified: Secondary | ICD-10-CM

## 2012-06-08 DIAGNOSIS — G43909 Migraine, unspecified, not intractable, without status migrainosus: Secondary | ICD-10-CM

## 2012-06-08 DIAGNOSIS — E559 Vitamin D deficiency, unspecified: Secondary | ICD-10-CM

## 2012-06-08 LAB — POCT URINALYSIS DIPSTICK
Bilirubin, UA: NEGATIVE
Glucose, UA: NEGATIVE
Leukocytes, UA: NEGATIVE
Nitrite, UA: NEGATIVE
Urobilinogen, UA: 0.2

## 2012-06-08 MED ORDER — TOPIRAMATE 25 MG PO TABS: ORAL_TABLET | ORAL | Status: DC

## 2012-06-08 MED ORDER — LEVONORGEST-ETH ESTRAD 91-DAY 0.15-0.03 MG PO TABS: 1.0000 | ORAL_TABLET | Freq: Every day | ORAL | Status: DC

## 2012-06-08 MED ORDER — PHENTERMINE HCL 37.5 MG PO CAPS: 37.5000 mg | ORAL_CAPSULE | ORAL | Status: DC

## 2012-06-08 NOTE — Patient Instructions (Addendum)
Work on diet and regular exercise.  Can schedule mole removal any time. Start a regular exercise program and make sure you are eating a healthy diet Try to eat 4 servings of dairy a day  Your vaccines are up to date.

## 2012-06-08 NOTE — Progress Notes (Signed)
Subjective:     Connie Carlson is a 31 y.o. female and is here for a comprehensive physical exam. The patient reports problems - she would like to restart her Topamax. We had thought about a year ago she's been off of it for quite some time but would like to restart the medication. She also has a couple of new moles on her face she would like me to look at. She would like them removed. She also has concerns about her weight gain. She's not exercising regularly. She recently quit work and so now has more time to exercise and says she plans on starting an exercise routine. She has been on phentermine in the past and did well with it. She denies any chest pain or shortness of breath with the medication. She's interested in restarting it..  She also discussed her birth control today. She says often times she will skip several. Wants to just notices that K. or she should switch birth control pills. She is otherwise happy with it.  History   Social History  . Marital Status: Married    Spouse Name: N/A    Number of Children: 1  . Years of Education: N/A   Occupational History  . Not on file.   Social History Main Topics  . Smoking status: Never Smoker   . Smokeless tobacco: Not on file  . Alcohol Use: No  . Drug Use: No  . Sexually Active: Yes -- Female partner(s)   Other Topics Concern  . Not on file   Social History Narrative  . No narrative on file   Health Maintenance  Topic Date Due  . Pap Smear  06/09/2015  . Tetanus/tdap  06/08/2022  . Influenza Vaccine  05/21/2012    The following portions of the patient's history were reviewed and updated as appropriate: allergies, current medications, past family history, past medical history, past social history, past surgical history and problem list.  Review of Systems A comprehensive review of systems was negative.   Objective:    BP 142/89  Pulse 80  Wt 230 lb (104.327 kg) General appearance: alert, cooperative, appears stated age and  moderately obese Head: Normocephalic, without obvious abnormality, atraumatic Eyes: conj clear, EOMi, PEERLA Ears: normal TM's and external ear canals both ears Nose: Nares normal. Septum midline. Mucosa normal. No drainage or sinus tenderness. Throat: lips, mucosa, and tongue normal; teeth and gums normal Neck: no adenopathy, no carotid bruit, no JVD, supple, symmetrical, trachea midline and thyroid not enlarged, symmetric, no tenderness/mass/nodules Back: symmetric, no curvature. ROM normal. No CVA tenderness. Lungs: clear to auscultation bilaterally Breasts: normal appearance, no masses or tenderness Heart: regular rate and rhythm, S1, S2 normal, no murmur, click, rub or gallop Abdomen: soft, non-tender; bowel sounds normal; no masses,  no organomegaly Pelvic: cervix normal in appearance, external genitalia normal, no adnexal masses or tenderness, no cervical motion tenderness, rectovaginal septum normal, uterus normal size, shape, and consistency and vagina normal without discharge Extremities: extremities normal, atraumatic, no cyanosis or edema Pulses: 2+ and symmetric Skin: Skin color, texture, turgor normal. No rashes or lesions Lymph nodes: Cervical, supraclavicular, and axillary nodes normal. Neurologic: Alert and oriented X 3, normal strength and tone. Normal symmetric reflexes. Normal coordination and gait   She does have several moles on her face. Her behavior to remove these for her. They do appear to be benign but cosmetically she would like them removed. She has one under her right eye, one on her left temple,  and one on her nose. She says they have been getting larger. They're nontender and do not bleed.   Assessment:    Healthy female exam.      Plan:     See After Visit Summary for Counseling Recommendations  Start a regular exercise program and make sure you are eating a healthy diet Try to eat 4 servings of dairy a day  Your vaccines are up to date.  Actually most  of her lab work is up-to-date. We had checked her blood work in April and everything looked good except her vitamin D and B12 are low. She's now taking an over-the-counter supplement. I gave her a lab slip to go and recheck her levels in.  Migraine headaches-restart her Topamax but would like her to followup in one month we can further discuss in detail tells and frequency of her headaches.  Obesity-we did discuss restarting the phentermine. I warned of potential side effects. Also claimed her that need to be part of an exercise and diet regimen. She says she does plan on starting an exercise program. I recommend followup in one month to repeat blood pressure and to check weight. Also explained her that if she loses weight on the medication and wanted to be discontinued. If she gets any chest pain or palpitations on it she needs to stop it immediately.  Multiple nevi on the face. He had to remove these a separate appointment. I did reassure her that they look benign.  She is due for tdap currently but unfortunately we are out of her current supply is we will try to catch her up her next visit if we get some more N.  Contraception-we discussed that it's okay to miss a period on birth control. This means that she's not producing as much lining which one she does not need to shed it. If she's otherwise happy with this, then I recommend she continue it. Patient is happy with this.

## 2012-06-12 ENCOUNTER — Ambulatory Visit: Payer: Private Health Insurance - Indemnity | Admitting: Family Medicine

## 2012-06-12 DIAGNOSIS — Z0289 Encounter for other administrative examinations: Secondary | ICD-10-CM

## 2012-07-10 ENCOUNTER — Ambulatory Visit: Payer: Private Health Insurance - Indemnity | Admitting: Family Medicine

## 2012-07-10 DIAGNOSIS — Z0289 Encounter for other administrative examinations: Secondary | ICD-10-CM

## 2012-07-14 ENCOUNTER — Ambulatory Visit (INDEPENDENT_AMBULATORY_CARE_PROVIDER_SITE_OTHER): Payer: Private Health Insurance - Indemnity | Admitting: Family Medicine

## 2012-07-14 VITALS — BP 129/84 | Wt 220.0 lb

## 2012-07-14 DIAGNOSIS — R635 Abnormal weight gain: Secondary | ICD-10-CM

## 2012-07-14 MED ORDER — PHENTERMINE HCL 37.5 MG PO CAPS: 37.5000 mg | ORAL_CAPSULE | ORAL | Status: DC

## 2012-07-14 NOTE — Progress Notes (Signed)
  Subjective:    Patient ID: Connie Carlson, female    DOB: [birth year], 31 y.o.   MRN: [id number]  HPI  Patient doing well on appetite suppressant.  Here for nurse visit, weight, BP, HR check.  Denies problems with insomnia, heart palpitations or tremors.  Satisfied with weight loss thus far and is working on [employer] and regular exercise.    Review of Systems     Objective:   Physical Exam        Assessment & Plan:  Abnormal weight gain-she's doing well. She's lost 10 pounds. Blood pressure actually looks much better this time. Okay refill medications for another month. Followup in one month for blood pressure and weight check with nurse. \ Nani Gasser, MD

## 2012-09-05 ENCOUNTER — Other Ambulatory Visit: Payer: Self-pay | Admitting: *Deleted

## 2012-09-05 MED ORDER — LEVONORGEST-ETH ESTRAD 91-DAY 0.15-0.03 MG PO TABS: 1.0000 | ORAL_TABLET | Freq: Every day | ORAL | Status: DC

## 2013-01-09 ENCOUNTER — Ambulatory Visit (INDEPENDENT_AMBULATORY_CARE_PROVIDER_SITE_OTHER): Payer: Private Health Insurance - Indemnity | Admitting: Family Medicine

## 2013-01-09 ENCOUNTER — Encounter: Payer: Self-pay | Admitting: Family Medicine

## 2013-01-09 ENCOUNTER — Telehealth: Payer: Self-pay | Admitting: *Deleted

## 2013-01-09 DIAGNOSIS — D239 Other benign neoplasm of skin, unspecified: Secondary | ICD-10-CM

## 2013-01-09 DIAGNOSIS — E669 Obesity, unspecified: Secondary | ICD-10-CM

## 2013-01-09 DIAGNOSIS — L821 Other seborrheic keratosis: Secondary | ICD-10-CM

## 2013-01-09 DIAGNOSIS — D229 Melanocytic nevi, unspecified: Secondary | ICD-10-CM

## 2013-01-09 DIAGNOSIS — R635 Abnormal weight gain: Secondary | ICD-10-CM

## 2013-01-09 MED ORDER — PHENTERMINE HCL 37.5 MG PO CAPS: 37.5000 mg | ORAL_CAPSULE | ORAL | Status: DC

## 2013-01-09 NOTE — Addendum Note (Signed)
Addended by: Deno Etienne on: 01/09/2013 05:17 PM   Modules accepted: Orders

## 2013-01-09 NOTE — Telephone Encounter (Signed)
Pt calls today & is wanting to know if you would like her to come in for a visit to see you or if she can do a nurse visit for a refill on phentermine.  According to her chart she hasn't been here since October 2013.  Please advise

## 2013-01-09 NOTE — Patient Instructions (Addendum)
Followup in one month for repeat blood pressure and weight check with the nurse if you would like a refill on the medication.  Make sure participating in a regular exercise program. Strongly encouraged her in with 30 minutes 5 days per week. He can break this up into smaller increments. Ultimately need to build up to 30 minutes 5 days per week.  Encourage a low calorie diet with lots of vegetables and fruits.  If you have any signs of infection on the biopsy that we did please let us know. We should have the pathology report back in about a week.  You can wash the biopsied area with regular soap and water, pat dry, then apply Vaseline or antibiotic ointment. He will need to apply this daily for about 7-10 days until it appears to be well-healed. Do not let it dry out.

## 2013-01-09 NOTE — Telephone Encounter (Signed)
Will need office visit with me if she's going to restart the medication.

## 2013-01-09 NOTE — Telephone Encounter (Signed)
Pt notified already has an appt 

## 2013-01-09 NOTE — Progress Notes (Signed)
Subjective:    Patient ID: Connie Carlson, female    DOB: 02-17-1981, 32 y.o.   MRN: [id number]  HPI Obesity-she was producing on phentermine about 6 months ago in October. She lost about 10 pounds on it. She says she was exercising some but not consistently. She's interested in restarting the medication. She has started some vegetable shakes with breakfast and with dinner. She's not currently exercising but [facility] so [facility]. She denies any chest pain or shortness of breath or shakiness on the phentermine. She says she tolerated it well without any side effects.  She does have a lesion on her chin that she would like to have removed. She also has a couple other lesions on her face and she would like to have removed as well. She has 2 lesions on her nose that she feels are getting a little bit larger and is concerned about this as well.   Review of Systems     Objective:   Physical Exam  Constitutional: She is oriented to person, place, and time. She appears well-developed and well-nourished.  HENT:  Head: Normocephalic and atraumatic.  Cardiovascular: Normal rate, regular rhythm and normal heart sounds.   Pulmonary/Chest: Effort normal and breath sounds normal.  Neurological: She is alert and oriented to person, place, and time.  Skin: Skin is warm and dry.  Psychiatric: She has a normal mood and affect. Her behavior is normal.          Assessment & Plan:  Obesity-will restart phentermine but strongly encouraged her to make sure that she is taking for an active exercise program and working on dietary changes. She says she is Re: tried to increase the vegetables in her diet and has been drinking some vegetable shakes in the morning and with dinner. I reminded her that exercise is extremely important as well. She has no known contraindications including heart disease or high blood pressure for the phentermine. Followup in one month for repeat blood pressure  and weight check.  Lesion on the chin-biopsy performed with shave technique. Will send for pathology for further evaluation. Will call with report once available.  Seborrheic keratoses-cryotherapy performed on the lesion just below her right eye and on her left temple.  She has to nevi on her nasal bridge. These do appear to be benign but she would like to have them removed cosmetically than I recommend referral to dermatology since they're over the nasal bridge.  Cryotherapy Procedure Note  Pre-operative Diagnosis:  Seborrheic keratosis x2  Post-operative Diagnosis: same  Locations: under right eye, on left temple  Indications: irritated  Anesthesia: not required    Procedure Details  Patient informed of risks (permanent scarring, infection, light or dark discoloration, bleeding, infection, weakness, numbness and recurrence of the lesion) and benefits of the procedure and verbal informed consent obtained.  The areas are treated with liquid nitrogen therapy, frozen until ice ball extended 1 mm beyond lesion, allowed to thaw, and treated again. The patient tolerated procedure well.  The patient was instructed on post-op care, warned that there may be blister formation, redness and pain. Recommend OTC analgesia as needed for pain.  Condition: Stable  Complications: none.  Plan: 1. Instructed to keep the area dry and covered for 24-48h and clean thereafter. 2. Warning signs of infection were reviewed.   3. Recommended that the patient use OTC acetaminophen as needed for pain.  4. Return in prn   Shave Biopsy Procedure Note  Pre-operative Diagnosis: Dysplastic nevi  Post-operative Diagnosis: same  Locations:chin    Indications: changing  Anesthesia: lidocaine 1% with epi  Procedure Details  History of allergy to iodine: no  Patient informed of the risks (including bleeding and infection) and benefits of the  procedure and Verbal informed consent obtained.  The  lesion and surrounding area were given a sterile prep using betadyne and draped in the usual sterile fashion. A scalpel was used to shave an area of skin approximately 2mm by 2mm.  Hemostasis achieved with alumuninum chloride. Antibiotic ointment and a sterile dressing applied.  The specimen was sent for pathologic examination. The patient tolerated the procedure well.  EBL: 0 ml  Findings: Will await pathology  Condition: Stable  Complications: none.  Plan: 1. Instructed to keep the wound dry and covered for 24-48h and clean thereafter. 2. Warning signs of infection were reviewed.   3. Recommended that the patient use OTC acetaminophen as needed for pain.  4. Return in prn  .

## 2013-02-09 ENCOUNTER — Ambulatory Visit: Payer: Private Health Insurance - Indemnity

## 2013-02-16 ENCOUNTER — Ambulatory Visit (INDEPENDENT_AMBULATORY_CARE_PROVIDER_SITE_OTHER): Payer: Private Health Insurance - Indemnity | Admitting: Family Medicine

## 2013-02-16 ENCOUNTER — Encounter: Payer: Self-pay | Admitting: Family Medicine

## 2013-02-16 VITALS — BP 128/86 | HR 89 | Wt 217.0 lb

## 2013-02-16 DIAGNOSIS — G43009 Migraine without aura, not intractable, without status migrainosus: Secondary | ICD-10-CM

## 2013-02-16 DIAGNOSIS — R635 Abnormal weight gain: Secondary | ICD-10-CM

## 2013-02-16 MED ORDER — PHENTERMINE HCL 37.5 MG PO CAPS: 37.5000 mg | ORAL_CAPSULE | ORAL | Status: DC

## 2013-02-16 MED ORDER — ATENOLOL 50 MG PO TABS: 25.0000 mg | ORAL_TABLET | Freq: Two times a day (BID) | ORAL | Status: DC

## 2013-02-16 MED ORDER — LEVONORGEST-ETH ESTRAD 91-DAY 0.15-0.03 MG PO TABS: 1.0000 | ORAL_TABLET | Freq: Every day | ORAL | Status: DC

## 2013-02-16 NOTE — Progress Notes (Signed)
  Subjective:    Patient ID: Connie Carlson, female    DOB: Jun 17, 1981, 32 y.o.   MRN: [id number]  HPI Abnormal weight gain  - She is doing well with medicaiton. No CP or SOB.  No palpiations.  Has lost 4 lbs. the medication is not affecting her sleep. She denies any side effects. She says she is exercising twice a week. She feels like she's doing well with her diet.  Migraine - Started taking topamax yeasrs ago.  Started it BID and didn't like how it made her feel so changed it to bedtime but then would get numbness dan tingling at night.  Finally stopped it.  Usually gets one HA a week.  Says when sever usually one one side of head. Gets light sensitive. Sometimes takeing 600mg  Advil and works sometimes but not consistantly. Says excedrin migraine hurts he stomach.    Review of Systems     Objective:   Physical Exam  Constitutional: She is oriented to person, place, and time. She appears well-developed and well-nourished.  HENT:  Head: Normocephalic and atraumatic.  Cardiovascular: Normal rate, regular rhythm and normal heart sounds.   Pulmonary/Chest: Effort normal and breath sounds normal.  Neurological: She is alert and oriented to person, place, and time.  Skin: Skin is warm and dry.  Psychiatric: She has a normal mood and affect. Her behavior is normal.          Assessment & Plan:  Abnormal Weight gain - she's doing well. She's lost 4 pounds and is tolerating the medication without any side effects. Refill the phentermine. Followup in one month for blood pressure weight check. She may need to come in a few days early because she's leaving town for vacation and wants to make sure that she can get her next refill if possible. I encouraged her to really work on increasing her exercise to reach her goals.  Migraine - discussed different options. I think she would be a good candidate for a low dose beta blocker to help control her migraines and she did not tolerate Topamax. We'll start  with atenolol 50 mg half a tab twice a day. I would like to see her back in one to 2 months. We'll see if her headache frequency is improving. She has actually never tried a tryptans for acute relief. We'll give her samples today.

## 2013-09-04 ENCOUNTER — Other Ambulatory Visit: Payer: Self-pay | Admitting: Family Medicine

## 2013-10-08 ENCOUNTER — Encounter: Payer: Self-pay | Admitting: Family Medicine

## 2013-10-08 ENCOUNTER — Ambulatory Visit (INDEPENDENT_AMBULATORY_CARE_PROVIDER_SITE_OTHER): Payer: Private Health Insurance - Indemnity | Admitting: Family Medicine

## 2013-10-08 VITALS — BP 143/65 | HR 80 | Temp 98.1°F | Ht 62.0 in | Wt 256.0 lb

## 2013-10-08 DIAGNOSIS — Z13220 Encounter for screening for lipoid disorders: Secondary | ICD-10-CM

## 2013-10-08 DIAGNOSIS — Z833 Family history of diabetes mellitus: Secondary | ICD-10-CM

## 2013-10-08 DIAGNOSIS — R635 Abnormal weight gain: Secondary | ICD-10-CM

## 2013-10-08 DIAGNOSIS — R7301 Impaired fasting glucose: Secondary | ICD-10-CM | POA: Insufficient documentation

## 2013-10-08 LAB — CBC
HCT: 41 % (ref 36.0–46.0)
HEMOGLOBIN: 13.5 g/dL (ref 12.0–15.0)
MCH: 29 pg (ref 26.0–34.0)
MCHC: 32.9 g/dL (ref 30.0–36.0)
MCV: 88 fL (ref 78.0–100.0)
Platelets: 344 10*3/uL (ref 150–400)
RBC: 4.66 MIL/uL (ref 3.87–5.11)
RDW: 14.3 % (ref 11.5–15.5)
WBC: 8 10*3/uL (ref 4.0–10.5)

## 2013-10-08 LAB — LIPID PANEL
CHOL/HDL RATIO: 3.4 ratio
Cholesterol: 156 mg/dL (ref 0–200)
HDL: 46 mg/dL (ref >39)
LDL CALC: 91 mg/dL (ref 0–99)
Triglycerides: 96 mg/dL (ref <150)
VLDL: 19 mg/dL (ref 0–40)

## 2013-10-08 LAB — HEMOGLOBIN A1C
Hgb A1c MFr Bld: 5.8 % — ABNORMAL HIGH (ref <5.7)
MEAN PLASMA GLUCOSE: 120 mg/dL — AB (ref <117)

## 2013-10-08 LAB — COMPLETE METABOLIC PANEL WITH GFR
ALBUMIN: 3.9 g/dL (ref 3.5–5.2)
ALK PHOS: 66 U/L (ref 39–117)
ALT: 20 U/L (ref 0–35)
AST: 24 U/L (ref 0–37)
BILIRUBIN TOTAL: 0.4 mg/dL (ref 0.3–1.2)
BUN: 9 mg/dL (ref 6–23)
CO2: 24 meq/L (ref 19–32)
Calcium: 9.4 mg/dL (ref 8.4–10.5)
Chloride: 105 mEq/L (ref 96–112)
Creat: 0.65 mg/dL (ref 0.50–1.10)
GFR, Est Non African American: >89 mL/min
GLUCOSE: 82 mg/dL (ref 70–99)
POTASSIUM: 4.4 meq/L (ref 3.5–5.3)
SODIUM: 139 meq/L (ref 135–145)
TOTAL PROTEIN: 7 g/dL (ref 6.0–8.3)

## 2013-10-08 LAB — TSH: TSH: 1.794 u[IU]/mL (ref 0.350–4.500)

## 2013-10-08 MED ORDER — PHENTERMINE-TOPIRAMATE ER 3.75-23 MG PO CP24: 1.0000 | ORAL_CAPSULE | Freq: Every morning | ORAL | Status: DC

## 2013-10-08 NOTE — Progress Notes (Signed)
   Subjective:    Patient ID: Connie Carlson, female    DOB: 1980-11-23, 33 y.o.   MRN: [id number]  HPI She is here to discuss abnormal weight gain. Her weight is 256 pounds. Previous weight in May of 2014 was 217 pounds. She's previously been on phentermine. She has no underlying heart conditions. Phentermine made her HAs worse so she eventually quit taking it..  She read about some new medication.   She is a member at [employer] but hasn't been going.  She's not currently exercising. She is also not currently counting her calories are watching her diet. She's very worried about developing diabetes as it runs in her family.  Review of Systems     Objective:   Physical Exam  Constitutional: She is oriented to person, place, and time. She appears well-developed and well-nourished.  HENT:  Head: Normocephalic and atraumatic.  Cardiovascular: Normal rate, regular rhythm and normal heart sounds.   Pulmonary/Chest: Effort normal and breath sounds normal.  Neurological: She is alert and oriented to person, place, and time.  Skin: Skin is warm and dry.  Psychiatric: She has a normal mood and affect. Her behavior is normal.          Assessment & Plan:  Abnormal weight gain-Discussed options.  She is most interested in trying one of the neural weight loss medications. We did discuss different options for weight loss medications. We also discussed the pros and cons and potential side effects and benefits of the medication. Will check her thyroid today to make sure that her levels are normal. We'll check baseline lipids as well.  Diet-will start using my fitness PAL. She does have a Smart phone to put this application on. Next  Ex[facility] We discussed starting with going 3 days a week for 30 minutes and building on that.  Medication-will start Qsymia. Monitor for increased headaches, though explained to her it also had a second agent called Topamax which can  actually help with migraines.. Card given so she can at least try it for 14 days for free. Followup in one month to discuss how she's doing, side effects of medication and make sure that she's losing weight. We discussed all these medications require start him on weight loss to be continued. They have been studied more long-term which is good.  Time spent 20 minutes discussing strategies for affective weight loss.Marland Kitchen

## 2014-02-18 ENCOUNTER — Other Ambulatory Visit: Payer: Self-pay | Admitting: Family Medicine

## 2014-08-08 ENCOUNTER — Other Ambulatory Visit: Payer: Self-pay | Admitting: *Deleted

## 2014-08-08 ENCOUNTER — Telehealth: Payer: Self-pay | Admitting: *Deleted

## 2014-08-08 ENCOUNTER — Other Ambulatory Visit: Payer: Self-pay | Admitting: Family Medicine

## 2014-08-08 MED ORDER — LEVONORGESTREL 0.75 MG PO TABS: 0.7500 mg | ORAL_TABLET | Freq: Two times a day (BID) | ORAL | Status: DC

## 2014-08-08 NOTE — Telephone Encounter (Signed)
Connie Carlson called and asked if a morning after pill could be sent to her pharmacy on file. She understands that it is OTC but her pharmacist told her that if you sent an order over it was $10 but if she were to purchase it was $50. Please advise. Margette Fast, CMA

## 2014-08-08 NOTE — Telephone Encounter (Signed)
rx sent

## 2014-08-08 NOTE — Telephone Encounter (Signed)
Voicemail left for patient. Margette Fast, CMA

## 2014-08-23 ENCOUNTER — Ambulatory Visit: Payer: Private Health Insurance - Indemnity | Admitting: Family Medicine

## 2014-08-23 DIAGNOSIS — Z0289 Encounter for other administrative examinations: Secondary | ICD-10-CM

## 2014-10-03 ENCOUNTER — Ambulatory Visit: Payer: Private Health Insurance - Indemnity | Admitting: Sports Medicine

## 2014-10-22 ENCOUNTER — Encounter: Payer: Private Health Insurance - Indemnity | Admitting: Family Medicine

## 2014-10-22 DIAGNOSIS — Z0289 Encounter for other administrative examinations: Secondary | ICD-10-CM

## 2014-12-19 ENCOUNTER — Telehealth: Payer: Self-pay | Admitting: *Deleted

## 2014-12-19 MED ORDER — KETOTIFEN FUMARATE 0.025 % OP SOLN: 1.0000 [drp] | Freq: Two times a day (BID) | OPHTHALMIC | Status: DC

## 2014-12-19 MED ORDER — FLUTICASONE PROPIONATE 50 MCG/ACT NA SUSP: 2.0000 | Freq: Every day | NASAL | Status: DC

## 2014-12-19 NOTE — Telephone Encounter (Signed)
Pt called and would like rx eye drops and nasal spray sent to [place]. She is congested and has itching watery eye due to allergies. I did recommend to her the otc nasal spray and eye drops but she states the rx's work better for her and are cheaper through insurance. We can send this in but if no improvement then she will need an appt before anything else can be rx'ed

## 2015-03-29 ENCOUNTER — Other Ambulatory Visit: Payer: Self-pay | Admitting: Family Medicine

## 2015-03-31 NOTE — Telephone Encounter (Signed)
Was unsure of this one since only #30 no rf was sent back in Nov.

## 2015-05-06 ENCOUNTER — Other Ambulatory Visit (HOSPITAL_COMMUNITY)
Admission: RE | Admit: 2015-05-06 | Discharge: 2015-05-06 | Disposition: A | Discharge status: Home / Self Care | Payer: Private Health Insurance - Indemnity | Source: Ambulatory Visit | Attending: Osteopathic Medicine | Admitting: Osteopathic Medicine

## 2015-05-06 ENCOUNTER — Ambulatory Visit (INDEPENDENT_AMBULATORY_CARE_PROVIDER_SITE_OTHER): Payer: Private Health Insurance - Indemnity | Admitting: Osteopathic Medicine

## 2015-05-06 ENCOUNTER — Encounter: Payer: Self-pay | Admitting: Osteopathic Medicine

## 2015-05-06 VITALS — BP 125/65 | HR 68 | Ht 62.0 in | Wt 244.0 lb

## 2015-05-06 DIAGNOSIS — Z114 Encounter for screening for human immunodeficiency virus [HIV]: Secondary | ICD-10-CM

## 2015-05-06 DIAGNOSIS — Z113 Encounter for screening for infections with a predominantly sexual mode of transmission: Secondary | ICD-10-CM | POA: Insufficient documentation

## 2015-05-06 DIAGNOSIS — R8290 Unspecified abnormal findings in urine: Secondary | ICD-10-CM

## 2015-05-06 DIAGNOSIS — Z1151 Encounter for screening for human papillomavirus (HPV): Secondary | ICD-10-CM | POA: Insufficient documentation

## 2015-05-06 DIAGNOSIS — Z13220 Encounter for screening for lipoid disorders: Secondary | ICD-10-CM

## 2015-05-06 DIAGNOSIS — B9689 Other specified bacterial agents as the cause of diseases classified elsewhere: Secondary | ICD-10-CM

## 2015-05-06 DIAGNOSIS — Z01419 Encounter for gynecological examination (general) (routine) without abnormal findings: Secondary | ICD-10-CM

## 2015-05-06 DIAGNOSIS — D509 Iron deficiency anemia, unspecified: Secondary | ICD-10-CM

## 2015-05-06 DIAGNOSIS — N3001 Acute cystitis with hematuria: Secondary | ICD-10-CM

## 2015-05-06 DIAGNOSIS — R748 Abnormal levels of other serum enzymes: Secondary | ICD-10-CM

## 2015-05-06 DIAGNOSIS — Z0000 Encounter for general adult medical examination without abnormal findings: Secondary | ICD-10-CM

## 2015-05-06 DIAGNOSIS — R7309 Other abnormal glucose: Secondary | ICD-10-CM

## 2015-05-06 DIAGNOSIS — N760 Acute vaginitis: Secondary | ICD-10-CM

## 2015-05-06 DIAGNOSIS — R7303 Prediabetes: Secondary | ICD-10-CM | POA: Insufficient documentation

## 2015-05-06 DIAGNOSIS — A499 Bacterial infection, unspecified: Secondary | ICD-10-CM

## 2015-05-06 DIAGNOSIS — Z7251 High risk heterosexual behavior: Secondary | ICD-10-CM

## 2015-05-06 LAB — POCT URINALYSIS DIPSTICK
BILIRUBIN UA: NEGATIVE
GLUCOSE UA: NEGATIVE
NITRITE UA: NEGATIVE
Protein, UA: NEGATIVE
Spec Grav, UA: 1.02
Urobilinogen, UA: 1
pH, UA: 7

## 2015-05-06 LAB — LIPID PANEL
CHOL/HDL RATIO: 3.7 ratio (ref <=5.0)
CHOLESTEROL: 136 mg/dL (ref 125–200)
HDL: 37 mg/dL — ABNORMAL LOW (ref >=46)
LDL Cholesterol: 80 mg/dL (ref <130)
TRIGLYCERIDES: 95 mg/dL (ref <150)
VLDL: 19 mg/dL (ref <30)

## 2015-05-06 LAB — COMPREHENSIVE METABOLIC PANEL
ALT: 85 U/L — ABNORMAL HIGH (ref 6–29)
AST: 47 U/L — ABNORMAL HIGH (ref 10–30)
Albumin: 4.1 g/dL (ref 3.6–5.1)
Alkaline Phosphatase: 69 U/L (ref 33–115)
BUN: 11 mg/dL (ref 7–25)
CALCIUM: 9.9 mg/dL (ref 8.6–10.2)
CHLORIDE: 104 mmol/L (ref 98–110)
CO2: 25 mmol/L (ref 20–31)
Creat: 0.8 mg/dL (ref 0.50–1.10)
GLUCOSE: 88 mg/dL (ref 65–99)
POTASSIUM: 4.2 mmol/L (ref 3.5–5.3)
Sodium: 138 mmol/L (ref 135–146)
Total Bilirubin: 0.4 mg/dL (ref 0.2–1.2)
Total Protein: 7.4 g/dL (ref 6.1–8.1)

## 2015-05-06 LAB — HEMOGLOBIN A1C
Hgb A1c MFr Bld: 5.6 % (ref <5.7)
Mean Plasma Glucose: 114 mg/dL (ref <117)

## 2015-05-06 LAB — POCT URINE PREGNANCY: Preg Test, Ur: NEGATIVE

## 2015-05-06 NOTE — Progress Notes (Signed)
Chief complaint: PHYSICAL EXAM - ANNUAL  HPI: Connie Carlson is a 34 y.o. female who presents to [facility]  today for annual physical exam. This is a patient of Dr. Suzi Roots but she requested exam done ASAP prior to leaving for vacation.  Patient reports occasional vaginal discharge and some concern for possible blood in the urine, no abdominal pain, no fever or chill. She is on birth control as noted below and menstrual cycles are regular although she does note some spotting between periods.   Also complains of bothersome moles on her neck and one on her left leg which are bothersome to her, catching on her clothes.not sure these have changed very much over the past few years.   No past medical history on file. Past Surgical History  Procedure Laterality Date  . Cholecystectomy     Social History  Substance Use Topics  . Smoking status: Never Smoker   . Smokeless tobacco: Not on file  . Alcohol Use: No   Medications: Current Outpatient Prescriptions  Medication Sig Dispense Refill  . levonorgestrel-ethinyl estradiol (SEASONALE,INTROVALE,JOLESSA) 0.15-0.03 MG tablet take 1 tablet by mouth once daily 91 tablet 1  . fluticasone (FLONASE) 50 MCG/ACT nasal spray Place 2 sprays into both nostrils daily. (Patient not taking: Reported on 05/06/2015) 16 g 2   No current facility-administered medications for this visit.   No Known Allergies   Review of Systems: CONSTITUTIONAL: Neg fever/chills, no unintentional weight changes HEAD/EYES/EARS/NOSE: No headache/vision change/heraing change CARDIAC: No chest pain/pressure/palpitations, no orthopnea RESPIRATORY: No cough/shortness of breath/wheeze GASTROINTESTINAL: No nausea/vomiting/abdominal pain/blood in stool/diarrhea/constipation MUSCULOSKELETAL: No myalgia/arthralgia GENITOURINARY: No incontinence, eport abnormal vaginal discharge and possible bloody urine as noted in history of present illness SKIN:  concerning mole as noted in history of present illness HEM/ONC: No easy bruising/bleeding, no abnormal lymph node    Exam:  BP 125/65 mmHg  Pulse 68  Ht 5\' 2"  (1.575 m)  Wt 244 lb (110.678 kg)  BMI 44.62 kg/m2 Constitutional: VSS, see nurse notes. General Appearance: alert, well-developed, well-nourished, NAD Eyes: Normal lids and conjunctive, non-icteric sclera, PERRLA Ears, Nose, Mouth, Throat: Normal external auditory canal and TM bilaterally, MMM, posterior pharynx without erythema/exudate Neck: No masses, trachea midline. No thyroid enlargement/tenderness/mass appreciated Respiratory: Normal respiratory effort. No dullness/hyper-resonance to percussion. Breath sounds normal, no wheeze/rhonchi/rales Cardiovascular: S1/S2 normal, no murmur/rub/gallop auscultated. No carotid bruit or JVD. Pedal pulse II/IV bilaterally DP and PT. No lower extremity edema Gastrointestinal: Nontender, no masses. No hepatomegaly, no splenomegaly. No hernia appreciated Musculoskeletal: Gait normal. No clubbing/cyanosis of digits Neurological: No cranial nerve deficit on limited exam. Motor and sensation intact and symmetric Psychiatric: Normal judgment/insight. Normal mood and affect Skin: Skin tags on neck, L neck (+) irritated skin tag, L anterior thigh o.4cm mole, irritated, nondraining/nonulcerated, sharp border, color pale and dark but consistent coloring.   GYN: No lesions/ulcers to external genitalia, normal urethra, normal vaginal mucosa, physiologic discharge tinged with scant blood, cervix normal without lesions, uterus not enlarged or tender, adnexa no masses and nontender BREAST: No rashes/skin changes, normal fibrous breast tissue, no masses or tenderness, normal nipple without discharge, normal axilla. Small dark mole on 6:00 position R breast, (pt notes no change in this mole, nonbothersome)    Results for orders placed or performed in visit on 05/06/15 (from the past 24 hour(s))  POCT urine  pregnancy     Status: None   Collection Time: 05/06/15 10:07 AM  Result Value Ref Range   Preg Test,  Ur Negative Negative   No results found.   ASSESSMENT/PLAN: Please see individual assessment and plan sections for details and orders.  Summary and/or additional information as follows:  Preventative care reviewed and addressed as below.  Patient requests HIV and gonorrhea chlamydia testing this was included with today's orders Prediabetic, overdue for retest hemoglobin A1c Urinalysis in office abnormal, we will send for further testing with microscopy and culture Patient return to clinic for excision of bothersome moles,  Encouraged to make an appointment asap for biopsy/excision to definitively rule out abnormality, low suspicion given character of these skin lesion and patient report of the slow/no growth pattern.    FEMALE PREVENTIVE CARE  ANNUAL SCREENING/COUNSELING Tobacco - NONE Alcohol - OCCASIONAL/SOCIAL Diet/Exercise - HEALTHY HABITS DISCUSSED Sexual Health/STI - NO CONCERNS Depression - NO CONCERNS Domestic violence - NO CONCERNS HTN - SEE VITALS, WNL Vaccination status - UTD PER PATIENT  INFECTIOUS DISEASE SCREENING HIV - all adults 15-65 - WILL GET TODAY GC/CT - sexually active -  WILL GET TODAY HepC - born 11-1964 - NO NEED  DISEASE SCREENING Lipid - age 14+ if risk - WILL GET DM2 - overweight age 50-70 or other risk factors - PREDIABETES Osteoporosis - age 53+ or one sooner if risk - NO NEED  CANCER SCREENING Cervical - Pap q3 yr age 53+, Pap + HPV q5y age 73+ - WILL GET TODAY Breast - Mammo age 74+ (C) and biennial age 35-75 (A) - NO NEED Lung - low dose CT Chest age 75-80 - NO NEED Colon - age 39+ or 35 years of age prior to Mildred Dx - NO FH  VACCINATION Influenza - annual - RECOMMENDED HPV - age <42yo - NO NEED Zoster - age 53+ - NO NEED Pneumonia - age 82+ sooner if risk (DM, other) - NO NEED  OTHER Fall - exercise and Vit D age 60+ - NO  NEED Consider ASA - age 37-59 - NO NEED

## 2015-05-06 NOTE — Patient Instructions (Addendum)
Preventive Care for Adults A healthy lifestyle and preventive care can promote health and wellness. Preventive health guidelines for women include the following key practices.  A routine yearly physical is a good way to check with your health care provider about your health and preventive screening. It is a chance to share any concerns and updates on your health and to receive a thorough exam.  Visit your dentist for a routine exam and preventive care every 6 months. Brush your teeth twice a day and floss once a day. Good oral hygiene prevents tooth decay and gum disease.  The frequency of eye exams is based on your age, health, family medical history, use of contact lenses, and other factors. Follow your health care provider's recommendations for frequency of eye exams.  Eat a healthy diet. Foods like vegetables, fruits, whole grains, low-fat dairy products, and lean protein foods contain the nutrients you need without too many calories. Decrease your intake of foods high in solid fats, added sugars, and salt. Eat the right amount of calories for you.Get information about a proper diet from your health care provider, if necessary.  Regular physical exercise is one of the most important things you can do for your health. Most adults should get at least 150 minutes of moderate-intensity exercise (any activity that increases your heart rate and causes you to sweat) each week. In addition, most adults need muscle-strengthening exercises on 2 or more days a week.  Maintain a healthy weight. The body mass index (BMI) is a screening tool to identify possible weight problems. It provides an estimate of body fat based on height and weight. Your health care provider can find your BMI and can help you achieve or maintain a healthy weight.For adults 20 years and older:  A BMI below 18.5 is considered underweight.  A BMI of 18.5 to 24.9 is normal.  A BMI of 25 to 29.9 is considered overweight.  A BMI of  30 and above is considered obese.  Maintain normal blood lipids and cholesterol levels by exercising and minimizing your intake of saturated fat. Eat a balanced diet with plenty of fruit and vegetables. Blood tests for lipids and cholesterol should begin at age 20 and be repeated every 5 years. If your lipid or cholesterol levels are high, you are over 50, or you are at high risk for heart disease, you may need your cholesterol levels checked more frequently.Ongoing high lipid and cholesterol levels should be treated with medicines if diet and exercise are not working.  If you smoke, find out from your health care provider how to quit. If you do not use tobacco, do not start.  Lung cancer screening is recommended for adults aged 55-80 years who are at high risk for developing lung cancer because of a history of smoking. A yearly low-dose CT scan of the lungs is recommended for people who have at least a 30-pack-year history of smoking and are a current smoker or have quit within the past 15 years. A pack year of smoking is smoking an average of 1 pack of cigarettes a day for 1 year (for example: 1 pack a day for 30 years or 2 packs a day for 15 years). Yearly screening should continue until the smoker has stopped smoking for at least 15 years. Yearly screening should be stopped for people who develop a health problem that would prevent them from having lung cancer treatment.  If you are pregnant, do not drink alcohol. If you are breastfeeding,   breastfeeding, be very cautious about drinking alcohol. If you are not pregnant and choose to drink alcohol, do not have more than 1 drink per day. One drink is considered to be 12 ounces (355 mL) of beer, 5 ounces (148 mL) of wine, or 1.5 ounces (44 mL) of liquor.  Avoid use of street drugs. Do not share needles with anyone. Ask for help if you need support or instructions about stopping the use of drugs.  High blood pressure causes heart disease and increases the risk of  stroke. Your blood pressure should be checked at least every 1 to 2 years. Ongoing high blood pressure should be treated with medicines if weight loss and exercise do not work.  If you are 3-31 years old, ask your health care provider if you should take aspirin to prevent strokes.  Diabetes screening involves taking a blood sample to check your fasting blood sugar level. This should be done once every 3 years, after age 31, if you are within normal weight and without risk factors for diabetes. Testing should be considered at a younger age or be carried out more frequently if you are overweight and have at least 1 risk factor for diabetes.  Breast cancer screening is essential preventive care for women. You should practice "breast self-awareness." This means understanding the normal appearance and feel of your breasts and may include breast self-examination. Any changes detected, no matter how small, should be reported to a health care provider. Women in their 76s and 30s should have a clinical breast exam (CBE) by a health care provider as part of a regular health exam every 1 to 3 years. After age 65, women should have a CBE every year. Starting at age 67, women should consider having a mammogram (breast X-ray test) every year. Women who have a family history of breast cancer should talk to their health care provider about genetic screening. Women at a high risk of breast cancer should talk to their health care providers about having an MRI and a mammogram every year.  Breast cancer gene (BRCA)-related cancer risk assessment is recommended for women who have family members with BRCA-related cancers. BRCA-related cancers include breast, ovarian, tubal, and peritoneal cancers. Having family members with these cancers may be associated with an increased risk for harmful changes (mutations) in the breast cancer genes BRCA1 and BRCA2. Results of the assessment will determine the need for genetic counseling and  BRCA1 and BRCA2 testing.  Routine pelvic exams to screen for cancer are no longer recommended for nonpregnant women who are considered low risk for cancer of the pelvic organs (ovaries, uterus, and vagina) and who do not have symptoms. Ask your health care provider if a screening pelvic exam is right for you.  If you have had past treatment for cervical cancer or a condition that could lead to cancer, you need Pap tests and screening for cancer for at least 20 years after your treatment. If Pap tests have been discontinued, your risk factors (such as having a new sexual partner) need to be reassessed to determine if screening should be resumed. Some women have medical problems that increase the chance of getting cervical cancer. In these cases, your health care provider may recommend more frequent screening and Pap tests.  The HPV test is an additional test that may be used for cervical cancer screening. The HPV test looks for the virus that can cause the cell changes on the cervix. The cells collected during the Pap test can  be tested for HPV. The HPV test could be used to screen women aged 98 years and older, and should be used in women of any age who have unclear Pap test results. After the age of 59, women should have HPV testing at the same frequency as a Pap test.  Colorectal cancer can be detected and often prevented. Most routine colorectal cancer screening begins at the age of 35 years and continues through age 61 years. However, your health care provider may recommend screening at an earlier age if you have risk factors for colon cancer. On a yearly basis, your health care provider may provide home test kits to check for hidden blood in the stool. Use of a small camera at the end of a tube, to directly examine the colon (sigmoidoscopy or colonoscopy), can detect the earliest forms of colorectal cancer. Talk to your health care provider about this at age 18, when routine screening begins. Direct  exam of the colon should be repeated every 5-10 years through age 67 years, unless early forms of pre-cancerous polyps or small growths are found.  People who are at an increased risk for hepatitis B should be screened for this virus. You are considered at high risk for hepatitis B if:  You were born in a country where hepatitis B occurs often. Talk with your health care provider about which countries are considered high risk.  Your parents were born in a high-risk country and you have not received a shot to protect against hepatitis B (hepatitis B vaccine).  You have HIV or AIDS.  You use needles to inject street drugs.  You live with, or have sex with, someone who has hepatitis B.  You get hemodialysis treatment.  You take certain medicines for conditions like cancer, organ transplantation, and autoimmune conditions.  Hepatitis C blood testing is recommended for all people born from [birth year] through 1965 and any individual with known risks for hepatitis C.  Practice safe sex. Use condoms and avoid high-risk sexual practices to reduce the spread of sexually transmitted infections (STIs). STIs include gonorrhea, chlamydia, syphilis, trichomonas, herpes, HPV, and human immunodeficiency virus (HIV). Herpes, HIV, and HPV are viral illnesses that have no cure. They can result in disability, cancer, and death.  You should be screened for sexually transmitted illnesses (STIs) including gonorrhea and chlamydia if:  You are sexually active and are younger than 24 years.  You are older than 24 years and your health care provider tells you that you are at risk for this type of infection.  Your sexual activity has changed since you were last screened and you are at an increased risk for chlamydia or gonorrhea. Ask your health care provider if you are at risk.  If you are at risk of being infected with HIV, it is recommended that you take a prescription medicine daily to prevent HIV infection. This is  called preexposure prophylaxis (PrEP). You are considered at risk if:  You are a heterosexual woman, are sexually active, and are at increased risk for HIV infection.  You take drugs by injection.  You are sexually active with a partner who has HIV.  Talk with your health care provider about whether you are at high risk of being infected with HIV. If you choose to begin PrEP, you should first be tested for HIV. You should then be tested every 3 months for as long as you are taking PrEP.  Osteoporosis is a disease in which the bones lose minerals and  strength with aging. This can result in serious bone fractures or breaks. The risk of osteoporosis can be identified using a bone density scan. Women ages 48 years and over and women at risk for fractures or osteoporosis should discuss screening with their health care providers. Ask your health care provider whether you should take a calcium supplement or vitamin D to reduce the rate of osteoporosis.  Menopause can be associated with physical symptoms and risks. Hormone replacement therapy is available to decrease symptoms and risks. You should talk to your health care provider about whether hormone replacement therapy is right for you.  Use sunscreen. Apply sunscreen liberally and repeatedly throughout the day. You should seek shade when your shadow is shorter than you. Protect yourself by wearing long sleeves, pants, a wide-brimmed hat, and sunglasses year round, whenever you are outdoors.  Once a month, do a whole body skin exam, using a mirror to look at the skin on your back. Tell your health care provider of new moles, moles that have irregular borders, moles that are larger than a pencil eraser, or moles that have changed in shape or color.  Stay current with required vaccines (immunizations).  Influenza vaccine. All adults should be immunized every year.  Tetanus, diphtheria, and acellular pertussis (Td, Tdap) vaccine. Pregnant women should  receive 1 dose of Tdap vaccine during each pregnancy. The dose should be obtained regardless of the length of time since the last dose. Immunization is preferred during the 27th-36th week of gestation. An adult who has not previously received Tdap or who does not know her vaccine status should receive 1 dose of Tdap. This initial dose should be followed by tetanus and diphtheria toxoids (Td) booster doses every 10 years. Adults with an unknown or incomplete history of completing a 3-dose immunization series with Td-containing vaccines should begin or complete a primary immunization series including a Tdap dose. Adults should receive a Td booster every 10 years.  Varicella vaccine. An adult without evidence of immunity to varicella should receive 2 doses or a second dose if she has previously received 1 dose. Pregnant females who do not have evidence of immunity should receive the first dose after pregnancy. This first dose should be obtained before leaving the health care facility. The second dose should be obtained 4-8 weeks after the first dose.  Human papillomavirus (HPV) vaccine. Females aged 13-26 years who have not received the vaccine previously should obtain the 3-dose series. The vaccine is not recommended for use in pregnant females. However, pregnancy testing is not needed before receiving a dose. If a female is found to be pregnant after receiving a dose, no treatment is needed. In that case, the remaining doses should be delayed until after the pregnancy. Immunization is recommended for any person with an immunocompromised condition through the age of 48 years if she did not get any or all doses earlier. During the 3-dose series, the second dose should be obtained 4-8 weeks after the first dose. The third dose should be obtained 24 weeks after the first dose and 16 weeks after the second dose.  Zoster vaccine. One dose is recommended for adults aged 67 years or older unless certain conditions are  present.  Measles, mumps, and rubella (MMR) vaccine. Adults born before [birth year] generally are considered immune to measles and mumps. Adults born in [birth year] or later should have 1 or more doses of MMR vaccine unless there is a contraindication to the vaccine or there is laboratory evidence of immunity  to each of the three diseases. A routine second dose of MMR vaccine should be obtained at least 28 days after the first dose for students attending postsecondary schools, health care workers, or international travelers. People who received inactivated measles vaccine or an unknown type of measles vaccine during 1963-1967 should receive 2 doses of MMR vaccine. People who received inactivated mumps vaccine or an unknown type of mumps vaccine before 1979 and are at high risk for mumps infection should consider immunization with 2 doses of MMR vaccine. For females of childbearing age, rubella immunity should be determined. If there is no evidence of immunity, females who are not pregnant should be vaccinated. If there is no evidence of immunity, females who are pregnant should delay immunization until after pregnancy. Unvaccinated health care workers born before [birth year] who lack laboratory evidence of measles, mumps, or rubella immunity or laboratory confirmation of disease should consider measles and mumps immunization with 2 doses of MMR vaccine or rubella immunization with 1 dose of MMR vaccine.  Pneumococcal 13-valent conjugate (PCV13) vaccine. When indicated, a person who is uncertain of her immunization history and has no record of immunization should receive the PCV13 vaccine. An adult aged 58 years or older who has certain medical conditions and has not been previously immunized should receive 1 dose of PCV13 vaccine. This PCV13 should be followed with a dose of pneumococcal polysaccharide (PPSV23) vaccine. The PPSV23 vaccine dose should be obtained at least 8 weeks after the dose of PCV13 vaccine. An adult aged 65  years or older who has certain medical conditions and previously received 1 or more doses of PPSV23 vaccine should receive 1 dose of PCV13. The PCV13 vaccine dose should be obtained 1 or more years after the last PPSV23 vaccine dose.  Pneumococcal polysaccharide (PPSV23) vaccine. When PCV13 is also indicated, PCV13 should be obtained first. All adults aged 18 years and older should be immunized. An adult younger than age 74 years who has certain medical conditions should be immunized. Any person who resides in a nursing home or long-term care facility should be immunized. An adult smoker should be immunized. People with an immunocompromised condition and certain other conditions should receive both PCV13 and PPSV23 vaccines. People with human immunodeficiency virus (HIV) infection should be immunized as soon as possible after diagnosis. Immunization during chemotherapy or radiation therapy should be avoided. Routine use of PPSV23 vaccine is not recommended for American Indians, [place] Natives, or people younger than 65 years unless there are medical conditions that require PPSV23 vaccine. When indicated, people who have unknown immunization and have no record of immunization should receive PPSV23 vaccine. One-time revaccination 5 years after the first dose of PPSV23 is recommended for people aged 19-64 years who have chronic kidney failure, nephrotic syndrome, asplenia, or immunocompromised conditions. People who received 1-2 doses of PPSV23 before age 21 years should receive another dose of PPSV23 vaccine at age 42 years or later if at least 5 years have passed since the previous dose. Doses of PPSV23 are not needed for people immunized with PPSV23 at or after age 61 years.  Meningococcal vaccine. Adults with asplenia or persistent complement component deficiencies should receive 2 doses of quadrivalent meningococcal conjugate (MenACWY-D) vaccine. The doses should be obtained at least 2 months apart.  Microbiologists working with certain meningococcal bacteria, [employer] recruits, people at risk during an outbreak, and people who travel to or live in countries with a high rate of meningitis should be immunized. A first-year college student up through  age 18 years who is living in a residence hall should receive a dose if she did not receive a dose on or after her 16th birthday. Adults who have certain high-risk conditions should receive one or more doses of vaccine.  Hepatitis A vaccine. Adults who wish to be protected from this disease, have certain high-risk conditions, work with hepatitis A-infected animals, work in hepatitis A research labs, or travel to or work in countries with a high rate of hepatitis A should be immunized. Adults who were previously unvaccinated and who anticipate close contact with an international adoptee during the first 60 days after arrival in the [place] from a country with a high rate of hepatitis A should be immunized.  Hepatitis B vaccine. Adults who wish to be protected from this disease, have certain high-risk conditions, may be exposed to blood or other infectious body fluids, are household contacts or sex partners of hepatitis B positive people, are clients or workers in certain care facilities, or travel to or work in countries with a high rate of hepatitis B should be immunized.  Haemophilus influenzae type b (Hib) vaccine. A previously unvaccinated person with asplenia or sickle cell disease or having a scheduled splenectomy should receive 1 dose of Hib vaccine. Regardless of previous immunization, a recipient of a hematopoietic stem cell transplant should receive a 3-dose series 6-12 months after her successful transplant. Hib vaccine is not recommended for adults with HIV infection. Preventive Services / Frequency Ages 27 to 79 years  Blood pressure check.** / Every 1 to 2 years.  Lipid and cholesterol check.** / Every 5 years beginning at age  67.  Clinical breast exam.** / Every 3 years for women in their 74s and 58s.  BRCA-related cancer risk assessment.** / For women who have family members with a BRCA-related cancer (breast, ovarian, tubal, or peritoneal cancers).  Pap test.** / Every 2 years from ages 44 through 27. Every 3 years starting at age 60 through age 16 or 79 with a history of 3 consecutive normal Pap tests.  HPV screening.** / Every 3 years from ages 8 through ages 63 to 77 with a history of 3 consecutive normal Pap tests.  Hepatitis C blood test.** / For any individual with known risks for hepatitis C.  Skin self-exam. / Monthly.  Influenza vaccine. / Every year.  Tetanus, diphtheria, and acellular pertussis (Tdap, Td) vaccine.** / Consult your health care provider. Pregnant women should receive 1 dose of Tdap vaccine during each pregnancy. 1 dose of Td every 10 years.  Varicella vaccine.** / Consult your health care provider. Pregnant females who do not have evidence of immunity should receive the first dose after pregnancy.  HPV vaccine. / 3 doses over 6 months, if 60 and younger. The vaccine is not recommended for use in pregnant females. However, pregnancy testing is not needed before receiving a dose.  Measles, mumps, rubella (MMR) vaccine.** / You need at least 1 dose of MMR if you were born in 1957 or later. You may also need a 2nd dose. For females of childbearing age, rubella immunity should be determined. If there is no evidence of immunity, females who are not pregnant should be vaccinated. If there is no evidence of immunity, females who are pregnant should delay immunization until after pregnancy.  Pneumococcal 13-valent conjugate (PCV13) vaccine.** / Consult your health care provider.  Pneumococcal polysaccharide (PPSV23) vaccine.** / 1 to 2 doses if you smoke cigarettes or if you have certain conditions.  Meningococcal  1 dose if you are age 19 to 21 years and a first-year college  student living in a residence hall, or have one of several medical conditions, you need to get vaccinated against meningococcal disease. You may also need additional booster doses.  Hepatitis A vaccine.** / Consult your health care provider.  Hepatitis B vaccine.** / Consult your health care provider.  Haemophilus influenzae type b (Hib) vaccine.** / Consult your health care provider. Ages 40 to 64 years  Blood pressure check.** / Every 1 to 2 years.  Lipid and cholesterol check.** / Every 5 years beginning at age 20 years.  Lung cancer screening. / Every year if you are aged 55-80 years and have a 30-pack-year history of smoking and currently smoke or have quit within the past 15 years. Yearly screening is stopped once you have quit smoking for at least 15 years or develop a health problem that would prevent you from having lung cancer treatment.  Clinical breast exam.** / Every year after age 40 years.  BRCA-related cancer risk assessment.** / For women who have family members with a BRCA-related cancer (breast, ovarian, tubal, or peritoneal cancers).  Mammogram.** / Every year beginning at age 40 years and continuing for as long as you are in good health. Consult with your health care provider.  Pap test.** / Every 3 years starting at age 30 years through age 65 or 70 years with a history of 3 consecutive normal Pap tests.  HPV screening.** / Every 3 years from ages 30 years through ages 65 to 70 years with a history of 3 consecutive normal Pap tests.  Fecal occult blood test (FOBT) of stool. / Every year beginning at age 50 years and continuing until age 75 years. You may not need to do this test if you get a colonoscopy every 10 years.  Flexible sigmoidoscopy or colonoscopy.** / Every 5 years for a flexible sigmoidoscopy or every 10 years for a colonoscopy beginning at age 50 years and continuing until age 75 years.  Hepatitis C blood test.** / For all people born from 1946 through  1965 and any individual with known risks for hepatitis C.  Skin self-exam. / Monthly.  Influenza vaccine. / Every year.  Tetanus, diphtheria, and acellular pertussis (Tdap/Td) vaccine.** / Consult your health care provider. Pregnant women should receive 1 dose of Tdap vaccine during each pregnancy. 1 dose of Td every 10 years.  Varicella vaccine.** / Consult your health care provider. Pregnant females who do not have evidence of immunity should receive the first dose after pregnancy.  Zoster vaccine.** / 1 dose for adults aged 60 years or older.  Measles, mumps, rubella (MMR) vaccine.** / You need at least 1 dose of MMR if you were born in 1957 or later. You may also need a 2nd dose. For females of childbearing age, rubella immunity should be determined. If there is no evidence of immunity, females who are not pregnant should be vaccinated. If there is no evidence of immunity, females who are pregnant should delay immunization until after pregnancy.  Pneumococcal 13-valent conjugate (PCV13) vaccine.** / Consult your health care provider.  Pneumococcal polysaccharide (PPSV23) vaccine.** / 1 to 2 doses if you smoke cigarettes or if you have certain conditions.  Meningococcal vaccine.** / Consult your health care provider.  Hepatitis A vaccine.** / Consult your health care provider.  Hepatitis B vaccine.** / Consult your health care provider.  Haemophilus influenzae type b (Hib) vaccine.** / Consult your health care provider. Ages 65   years and over  Blood pressure check.** / Every 1 to 2 years.  Lipid and cholesterol check.** / Every 5 years beginning at age 20 years.  Lung cancer screening. / Every year if you are aged 55-80 years and have a 30-pack-year history of smoking and currently smoke or have quit within the past 15 years. Yearly screening is stopped once you have quit smoking for at least 15 years or develop a health problem that would prevent you from having lung cancer  treatment.  Clinical breast exam.** / Every year after age 40 years.  BRCA-related cancer risk assessment.** / For women who have family members with a BRCA-related cancer (breast, ovarian, tubal, or peritoneal cancers).  Mammogram.** / Every year beginning at age 40 years and continuing for as long as you are in good health. Consult with your health care provider.  Pap test.** / Every 3 years starting at age 30 years through age 65 or 70 years with 3 consecutive normal Pap tests. Testing can be stopped between 65 and 70 years with 3 consecutive normal Pap tests and no abnormal Pap or HPV tests in the past 10 years.  HPV screening.** / Every 3 years from ages 30 years through ages 65 or 70 years with a history of 3 consecutive normal Pap tests. Testing can be stopped between 65 and 70 years with 3 consecutive normal Pap tests and no abnormal Pap or HPV tests in the past 10 years.  Fecal occult blood test (FOBT) of stool. / Every year beginning at age 50 years and continuing until age 75 years. You may not need to do this test if you get a colonoscopy every 10 years.  Flexible sigmoidoscopy or colonoscopy.** / Every 5 years for a flexible sigmoidoscopy or every 10 years for a colonoscopy beginning at age 50 years and continuing until age 75 years.  Hepatitis C blood test.** / For all people born from 1945 through 1965 and any individual with known risks for hepatitis C.  Osteoporosis screening.** / A one-time screening for women ages 65 years and over and women at risk for fractures or osteoporosis.  Skin self-exam. / Monthly.  Influenza vaccine. / Every year.  Tetanus, diphtheria, and acellular pertussis (Tdap/Td) vaccine.** / 1 dose of Td every 10 years.  Varicella vaccine.** / Consult your health care provider.  Zoster vaccine.** / 1 dose for adults aged 60 years or older.  Pneumococcal 13-valent conjugate (PCV13) vaccine.** / Consult your health care provider.  Pneumococcal  polysaccharide (PPSV23) vaccine.** / 1 dose for all adults aged 65 years and older.  Meningococcal vaccine.** / Consult your health care provider.  Hepatitis A vaccine.** / Consult your health care provider.  Hepatitis B vaccine.** / Consult your health care provider.  Haemophilus influenzae type b (Hib) vaccine.** / Consult your health care provider. ** Family history and personal history of risk and conditions may change your health care provider's recommendations. Document Released: 11/02/2001 Document Revised: 01/21/2014 Document Reviewed: 02/01/2011 Exit[employer] Patient Information 2015 [employer]. This information is not intended to replace advice given to you by your health care provider. Make sure you discuss any questions you have with your health care provider.  

## 2015-05-07 LAB — HIV ANTIBODY (ROUTINE TESTING W REFLEX): HIV: NONREACTIVE

## 2015-05-07 LAB — URINALYSIS, MICROSCOPIC ONLY
Casts: NONE SEEN [LPF]
Crystals: NONE SEEN [HPF]
Yeast: NONE SEEN [HPF]

## 2015-05-07 LAB — CYTOLOGY - PAP

## 2015-05-08 LAB — CERVICOVAGINAL ANCILLARY ONLY
BACTERIAL VAGINITIS: POSITIVE — AB
CANDIDA VAGINITIS: NEGATIVE

## 2015-05-08 MED ORDER — METRONIDAZOLE 500 MG PO TABS
500.0000 mg | ORAL_TABLET | Freq: Two times a day (BID) | ORAL | Status: DC
Start: 2015-05-08 — End: 2015-06-03

## 2015-05-08 MED ORDER — SULFAMETHOXAZOLE-TRIMETHOPRIM 800-160 MG PO TABS: 1.0000 | ORAL_TABLET | Freq: Two times a day (BID) | ORAL | Status: DC

## 2015-05-08 NOTE — Addendum Note (Signed)
Addended by: Maryla Morrow on: 05/08/2015 04:34 PM   Modules accepted: Orders

## 2015-05-09 LAB — URINE CULTURE: Colony Count: >=100000

## 2015-05-09 LAB — IRON AND TIBC
%SAT: 9 % — AB (ref 20–55)
IRON: 42 ug/dL (ref 42–145)
TIBC: 480 ug/dL — ABNORMAL HIGH (ref 250–470)
UIBC: 438 ug/dL — AB (ref 125–400)

## 2015-05-09 LAB — HEPATITIS B SURFACE ANTIGEN: Hepatitis B Surface Ag: NEGATIVE

## 2015-05-09 LAB — HEPATITIS C ANTIBODY: HCV AB: NEGATIVE

## 2015-05-09 MED ORDER — FERROUS SULFATE 325 (65 FE) MG PO TABS: 325.0000 mg | ORAL_TABLET | Freq: Every day | ORAL | Status: DC

## 2015-05-09 NOTE — Addendum Note (Signed)
Addended by: Maryla Morrow on: 05/09/2015 04:21 PM   Modules accepted: Orders, Level of Service

## 2015-05-12 ENCOUNTER — Other Ambulatory Visit: Payer: Private Health Insurance - Indemnity

## 2015-06-03 ENCOUNTER — Ambulatory Visit (INDEPENDENT_AMBULATORY_CARE_PROVIDER_SITE_OTHER): Payer: Private Health Insurance - Indemnity | Admitting: Family Medicine

## 2015-06-03 ENCOUNTER — Encounter: Payer: Self-pay | Admitting: Family Medicine

## 2015-06-03 VITALS — BP 147/79 | HR 69 | Ht 62.0 in | Wt 236.0 lb

## 2015-06-03 DIAGNOSIS — N939 Abnormal uterine and vaginal bleeding, unspecified: Secondary | ICD-10-CM | POA: Diagnosis not present

## 2015-06-03 NOTE — Progress Notes (Signed)
   Subjective:    Patient ID: Connie Carlson, female    DOB: 11-Jul-1981, 34 y.o.   MRN: [id number]  HPI Has been on the same birth control for 6 year.  For the last 2 month she has been spotting every days.  Normal pap smear and STD testing about  A month ago. Started iron recently for low iron.  No cramping or a pain. Recent neg STD testing.  She is using the pill for birth control purposes.  She denies any other recent changes medications or diet etc. She has gained about 36 pounds in the last 4 years. BMI is 43.   Review of Systems  BP 147/79 mmHg  Pulse 69  Ht 5\' 2"  (1.575 m)  Wt 236 lb (107.049 kg)  BMI 43.15 kg/m2  LMP 03/25/2015 (Approximate)    No Known Allergies  No past medical history on file.  Past Surgical History  Procedure Laterality Date  . Cholecystectomy      Social History   Social History  . Marital Status: Married    Spouse Name: N/A  . Number of Children: 1  . Years of Education: N/A   Occupational History  . Not on file.   Social History Main Topics  . Smoking status: Never Smoker   . Smokeless tobacco: Not on file  . Alcohol Use: No  . Drug Use: No  . Sexual Activity:    Partners: Male   Other Topics Concern  . Not on file   Social History Narrative    Family History  Problem Relation Age of Onset  . Diabetes      Outpatient Encounter Prescriptions as of 06/03/2015  Medication Sig  . ferrous sulfate (FERROUSUL) 325 (65 FE) MG tablet Take 1 tablet (325 mg total) by mouth daily with breakfast.  . levonorgestrel-ethinyl estradiol (SEASONALE,INTROVALE,JOLESSA) 0.15-0.03 MG tablet take 1 tablet by mouth once daily  . [DISCONTINUED] fluticasone (FLONASE) 50 MCG/ACT nasal spray Place 2 sprays into both nostrils daily. (Patient not taking: Reported on 05/06/2015)  . [DISCONTINUED] metroNIDAZOLE (FLAGYL) 500 MG tablet Take 1 tablet (500 mg total) by mouth 2 (two) times daily.  . [DISCONTINUED] sulfamethoxazole-trimethoprim (BACTRIM DS,SEPTRA DS)  800-160 MG per tablet Take 1 tablet by mouth 2 (two) times daily.   No facility-administered encounter medications on file as of 06/03/2015.          Objective:   Physical Exam  Constitutional: She is oriented to person, place, and time. She appears well-developed.  HENT:  Head: Normocephalic.  Neurological: She is alert and oriented to person, place, and time.  Skin: Skin is warm and dry.  Psychiatric: She has a normal mood and affect. Her behavior is normal.          Assessment & Plan:  Metrorrhagia -  Explained that it could be a thickened endometrial lining causing some breakthrough bleeding since she is on the 90 day birth control pill and has been on it for 6 years without any recent changes. He could also be due to atrophy of the lining as well. Also she has gained a significant amount of weight in the last year to which could definitely lead to increase estrogen levels which could be causing the abnormal bleeding as well. We'll start with pelvic ultrasound.  Explained this does not completely rule out other things such as polyps. She's had recent normal Pap smear and negative STD testing 1.

## 2015-06-16 ENCOUNTER — Other Ambulatory Visit: Payer: Private Health Insurance - Indemnity

## 2015-08-21 ENCOUNTER — Encounter: Payer: Self-pay | Admitting: Family Medicine

## 2015-08-21 ENCOUNTER — Ambulatory Visit (INDEPENDENT_AMBULATORY_CARE_PROVIDER_SITE_OTHER): Payer: Private Health Insurance - Indemnity | Admitting: Family Medicine

## 2015-08-21 VITALS — BP 124/76 | HR 88 | Temp 98.6°F | Resp 18 | Wt 213.4 lb

## 2015-08-21 DIAGNOSIS — Z0184 Encounter for antibody response examination: Secondary | ICD-10-CM

## 2015-08-21 DIAGNOSIS — Z113 Encounter for screening for infections with a predominantly sexual mode of transmission: Secondary | ICD-10-CM | POA: Diagnosis not present

## 2015-08-21 NOTE — Progress Notes (Signed)
   Subjective:    Patient ID: Connie Carlson, female    DOB: [birth year], 34 y.o.   MRN: [id number]  HPI Here today to have labs done. She is applying for citizenship. She had her MMR vaccine placed last week.  She needs RPR, gonorrhea and varicella titers drawn.  She will she remembers having chickenpox as a child and in fact has some scars on her face from it. She needs to have the results completed by December 20 says she can get her paperwork mailed.   Review of Systems     Objective:   Physical Exam  Constitutional: She is oriented to person, place, and time. She appears well-developed and well-nourished.  HENT:  Head: Normocephalic and atraumatic.  Eyes: Conjunctivae and EOM are normal.  Cardiovascular: Normal rate.   Pulmonary/Chest: Effort normal.  Neurological: She is alert and oriented to person, place, and time.  Skin: Skin is dry. No pallor.  Psychiatric: She has a normal mood and affect. Her behavior is normal.  Vitals reviewed.         Assessment & Plan:  Immunity testing-will check for varicella titer. Next  STD screening-we'll check RPR and gonorrhea with urinalysis.  We'll call results once available.

## 2015-08-22 LAB — GC PROBE AMPLIFICATION, URINE: GC PROBE AMP, URINE: NEGATIVE

## 2015-08-22 LAB — VARICELLA ZOSTER ANTIBODY, IGG: VARICELLA IGG: 537.8 {index} — AB (ref <135.00)

## 2015-08-22 LAB — RPR

## 2015-09-12 ENCOUNTER — Other Ambulatory Visit: Payer: Self-pay | Admitting: Family Medicine

## 2015-09-12 ENCOUNTER — Other Ambulatory Visit: Payer: Self-pay

## 2015-09-12 MED ORDER — LEVONORGEST-ETH ESTRAD 91-DAY 0.15-0.03 MG PO TABS: 1.0000 | ORAL_TABLET | Freq: Every day | ORAL | Status: DC

## 2016-06-11 ENCOUNTER — Ambulatory Visit: Payer: Private Health Insurance - Indemnity | Admitting: Family Medicine

## 2016-09-07 ENCOUNTER — Ambulatory Visit: Payer: Private Health Insurance - Indemnity | Admitting: Family Medicine

## 2016-09-07 NOTE — Progress Notes (Deleted)
Subjective:    CC: Wants to start weight loss med again.   HPI: 35 year old female with a BMI greater than 40 comes in today to discuss weight loss. She has been her several times over the last several years for similar concerns. She has been on phentermine twice. In the last time I saw her we decided to prescribe one of the newer agents. She never followed back up.  He said difficulty committing to exercise in the past.  Past medical history, Surgical history, Family history not pertinant except as noted below, Social history, Allergies, and medications have been entered into the medical record, reviewed, and corrections made.   Review of Systems: No fevers, chills, night sweats, weight loss, chest pain, or shortness of breath.   Objective:    General: Well Developed, well nourished, and in no acute distress.  Neuro: Alert and oriented x3, extra-ocular muscles intact, sensation grossly intact.  HEENT: Normocephalic, atraumatic  Skin: Warm and dry, no rashes. Cardiac: Regular rate and rhythm, no murmurs rubs or gallops, no lower extremity edema.  Respiratory: Clear to auscultation bilaterally. Not using accessory muscles, speaking in full sentences.   Impression and Recommendations:    Weight gain/obesity

## 2016-09-23 NOTE — Progress Notes (Signed)
Subjective:    Patient ID: MIAMI MERCHAN, female    DOB: April 05, 1981, 36 y.o.   MRN: [id number]  HPI 75 you female with no prior cardiac history comes in today to discuss weight loss. She is interested in getting back on track with this. She was previously on phentermine about 2 years ago and did well. No prior hx of cardiac conditions. She has IFG and BP is up today. She previously took phentermine and did well with it. That she did feel little jittery on it. She is interested in discussing some newer options on the market. No recent chest pain shortness of breath or palpitations. She is fasting this morning but did have coffee. She is not actively exercising plans to start back at the gym. He is also planning on starting a low carbohydrate diet.   Review of Systems  BP 128/74 (BP Location: Left Arm)   Pulse 79   Ht 5\' 2"  (1.575 m)   Wt 209 lb (94.8 kg)   SpO2 100%   BMI 38.23 kg/m     Allergies  Allergen Reactions  . Topiramate Other (See Comments)    Leg cramps    No past medical history on file.  Past Surgical History:  Procedure Laterality Date  . CHOLECYSTECTOMY      Social History   Social History  . Marital status: Married    Spouse name: N/A  . Number of children: 1  . Years of education: N/A   Occupational History  . Not on file.   Social History Main Topics  . Smoking status: Never Smoker  . Smokeless tobacco: Not on file  . Alcohol use No  . Drug use: No  . Sexual activity: Yes    Partners: Male   Other Topics Concern  . Not on file   Social History Narrative  . No narrative on file    Family History  Problem Relation Age of Onset  . Diabetes      Outpatient Encounter Prescriptions as of 09/28/2016  Medication Sig  . ferrous sulfate (FERROUSUL) 325 (65 FE) MG tablet Take 1 tablet (325 mg total) by mouth daily with breakfast.  . levonorgestrel-ethinyl estradiol (SEASONALE,INTROVALE,JOLESSA) 0.15-0.03 MG tablet Take 1 tablet by mouth daily.  .  Liraglutide -Weight Management (SAXENDA) 18 MG/3ML SOPN Inject 0.6 mg into the skin daily. After 2 weeks ok to increase to 1.2 mg. Then after 2 weeks ok to increase to 1.8mg .   No facility-administered encounter medications on file as of 09/28/2016.          Objective:   Physical Exam  Constitutional: She is oriented to person, place, and time. She appears well-developed and well-nourished.  HENT:  Head: Normocephalic and atraumatic.  Cardiovascular: Normal rate, regular rhythm and normal heart sounds.   Pulmonary/Chest: Effort normal and breath sounds normal.  Neurological: She is alert and oriented to person, place, and time.  Skin: Skin is warm and dry.  Psychiatric: She has a normal mood and affect. Her behavior is normal.       Assessment & Plan:  Abnormal weight gain - We discussed options. I think she'll be a great candidate for Sexenda. Discussed potential side effects of the medication. If not covered by her insurance and we can always try phentermine again but consider starting with a lower dose like 15 mg. She plans on getting onto a low carbohydrate diet and getting back into the gym to exercise regularly.  Elevated blood pressure without diagnosis  of hypertension- 2nd reading is at goal. Will monitor carefully. No prior dx of HTN.

## 2016-09-28 ENCOUNTER — Encounter: Payer: Self-pay | Admitting: Family Medicine

## 2016-09-28 ENCOUNTER — Ambulatory Visit (INDEPENDENT_AMBULATORY_CARE_PROVIDER_SITE_OTHER): Payer: Private Health Insurance - Indemnity | Admitting: Family Medicine

## 2016-09-28 VITALS — BP 128/74 | HR 79 | Ht 62.0 in | Wt 209.0 lb

## 2016-09-28 DIAGNOSIS — Z6839 Body mass index (BMI) 39.0-39.9, adult: Secondary | ICD-10-CM

## 2016-09-28 DIAGNOSIS — R635 Abnormal weight gain: Secondary | ICD-10-CM | POA: Diagnosis not present

## 2016-09-28 DIAGNOSIS — E6609 Other obesity due to excess calories: Secondary | ICD-10-CM

## 2016-09-28 DIAGNOSIS — Z6838 Body mass index (BMI) 38.0-38.9, adult: Secondary | ICD-10-CM | POA: Diagnosis not present

## 2016-09-28 MED ORDER — LIRAGLUTIDE -WEIGHT MANAGEMENT 18 MG/3ML ~~LOC~~ SOPN: 0.6000 mg | PEN_INJECTOR | Freq: Every day | SUBCUTANEOUS | 2 refills | Status: DC

## 2016-10-08 ENCOUNTER — Other Ambulatory Visit: Payer: Self-pay | Admitting: Family Medicine

## 2016-10-08 ENCOUNTER — Telehealth: Payer: Self-pay | Admitting: *Deleted

## 2016-10-08 NOTE — Telephone Encounter (Signed)
Prior auth sent form faxed for saxenda

## 2016-10-11 NOTE — Telephone Encounter (Signed)
Please call patient and let her know that after doing the prior authorization for the medication called Sexenda the planned sent [place] a letter saying that it was a formulary exclusion which means that it is not covered under any circumstances. She may want to consider calling her plan to see what may actually be covered. It may be that all of the weight loss medications are plan exclusion. Also we could consider formal evaluation and treatment at our local bariatric clinic here in [place] at [facility]. They have a full team implement a treatment plan if she is interested we can always place a referral.  Beatrice Lecher, MD

## 2016-10-13 MED ORDER — PHENTERMINE HCL 15 MG PO CAPS: 15.0000 mg | ORAL_CAPSULE | ORAL | 0 refills | Status: DC

## 2016-10-13 NOTE — Telephone Encounter (Signed)
Patient advised. Patient wants to restart phentermine. She says the side effects were not that bad.Connie KitchenMarland Carlson

## 2016-10-13 NOTE — Telephone Encounter (Signed)
Left message on vm

## 2016-10-13 NOTE — Telephone Encounter (Signed)
I will restart it but only at 15mg .    Beatrice Lecher, MD

## 2018-01-27 ENCOUNTER — Ambulatory Visit: Payer: Managed Care, Other (non HMO) | Admitting: Family Medicine

## 2018-01-27 ENCOUNTER — Encounter: Payer: Self-pay | Admitting: Family Medicine

## 2018-01-27 VITALS — BP 135/75 | HR 88 | Ht 62.0 in | Wt 216.0 lb

## 2018-01-27 DIAGNOSIS — Z30011 Encounter for initial prescription of contraceptive pills: Secondary | ICD-10-CM

## 2018-01-27 DIAGNOSIS — Z6839 Body mass index (BMI) 39.0-39.9, adult: Secondary | ICD-10-CM | POA: Diagnosis not present

## 2018-01-27 DIAGNOSIS — R635 Abnormal weight gain: Secondary | ICD-10-CM

## 2018-01-27 MED ORDER — LEVONORGEST-ETH ESTRAD 91-DAY 0.15-0.03 MG PO TABS: 1.0000 | ORAL_TABLET | Freq: Every day | ORAL | 3 refills | Status: DC

## 2018-01-27 MED ORDER — PHENTERMINE HCL 15 MG PO CAPS
15.0000 mg | ORAL_CAPSULE | ORAL | 0 refills | Status: DC
Start: 2018-01-27 — End: 2018-06-06

## 2018-01-27 NOTE — Progress Notes (Signed)
Subjective:    CC: Follow-up abnormal weight gain.  HPI:  37 year old female follows up today to discuss strategies around weight loss.  Actually saw her in January we had a discussion that she really wanted to get back on track with weight loss.  We discussed some of the newer agents and try to get a prior authorization on Saxenda.  Unfortunately it was denied.  So she decided she wanted to go on phentermine which she had taken in the past before.  I did send in 30 tabs of 15 mg phentermine but she never followed up. She has gained 7 lbs since January.     She also wants to discuss birth control today.  She would like to get back on her 90-day birth control.  Past medical history, Surgical history, Family history not pertinant except as noted below, Social history, Allergies, and medications have been entered into the medical record, reviewed, and corrections made.   Review of Systems: No fevers, chills, night sweats, weight loss, chest pain, or shortness of breath.   Objective:    General: Well Developed, well nourished, and in no acute distress.  Neuro: Alert and oriented x3, extra-ocular muscles intact, sensation grossly intact.  HEENT: Normocephalic, atraumatic  Skin: Warm and dry, no rashes. Cardiac: Regular rate and rhythm, no murmurs rubs or gallops, no lower extremity edema.  Respiratory: Clear to auscultation bilaterally. Not using accessory muscles, speaking in full sentences.   Impression and Recommendations:    Contraceptive counseling-restart medication.  Refill for 1 year.  Abnormal weight gain/BMI 39-she says she is ready to get started.  We discussed some strategies around diet and exercise in addition to the medication.  She has not been experiencing any chest pain or shortness of breath that would be a contraindication to taking the medication and she is tolerated well in the past.  We will start with 15 mg and if she starts plateauing with the medication we can always  bump it up if we need to.  Current weight: 216 pounds Previous weight: 209 pounds Change in weight: Up 7 pounds Goal weight: 175 pounds Exercise goal: Walk for 20 minutes 2 days a week.  Has physically active job. Dietary goal: Eat breakfast daily with some kind of protein.  Can be a protein shake, [place] yogurt, eggs etc.  Also work on [employer]. Medication: Start 15 mg phentermine daily Follow-up: 1 month with PCP

## 2018-01-27 NOTE — Patient Instructions (Signed)
Check with your insurance to see if they will cover some of the newer weight loss medications including Contrave, Belviq, or Qsymia since they will not cover Saxenda.    Current weight: 216 pounds Previous weight: 209 pounds Change in weight: Up 7 pounds Goal weight: 175 pounds Exercise goal: Walk for 20 minutes 2 days a week.  Has physically active job. Dietary goal: Eat breakfast daily with some kind of protein.  Can be a protein shake, [place] yogurt, eggs etc.  Also work on [employer]. Medication: Start 15 mg phentermine daily Follow-up: 1 month with PCP

## 2018-06-05 ENCOUNTER — Telehealth: Payer: Self-pay

## 2018-06-05 NOTE — Telephone Encounter (Signed)
Pt saw Dr Madilyn Fireman at the end of May and was given some options for weight loss medication and states she has just now had the opportunity to check on what insurances cover.   Pt wanting RX for Belviq sent to pharmacy.  I advised pt that an RX was sent to [facility] back in may and she states that although insurance covers it, she has tried it before and did not like the way it made her feel.   Can Belviq RX be sent in or does pt need an appt?  Please advise

## 2018-06-06 MED ORDER — LORCASERIN HCL ER 20 MG PO TB24: 1.0000 | ORAL_TABLET | Freq: Every day | ORAL | 0 refills | Status: DC

## 2018-06-06 NOTE — Telephone Encounter (Signed)
OK, new rx sent for Belviq. She will need to f/u up with me in one month before her refill.

## 2018-06-06 NOTE — Telephone Encounter (Signed)
Pt advised, 1 month follow up scheduled for 07-04-18

## 2018-07-04 ENCOUNTER — Ambulatory Visit: Payer: Managed Care, Other (non HMO) | Admitting: Family Medicine

## 2018-11-16 ENCOUNTER — Other Ambulatory Visit: Payer: Self-pay | Admitting: Family Medicine

## 2018-11-23 ENCOUNTER — Ambulatory Visit: Payer: Managed Care, Other (non HMO) | Admitting: Family Medicine

## 2019-02-19 ENCOUNTER — Other Ambulatory Visit: Payer: Self-pay | Admitting: Family Medicine

## 2019-02-19 DIAGNOSIS — Z30011 Encounter for initial prescription of contraceptive pills: Secondary | ICD-10-CM

## 2019-02-20 ENCOUNTER — Other Ambulatory Visit: Payer: Self-pay | Admitting: Family Medicine

## 2019-02-20 DIAGNOSIS — Z30011 Encounter for initial prescription of contraceptive pills: Secondary | ICD-10-CM

## 2019-03-05 ENCOUNTER — Other Ambulatory Visit: Payer: Self-pay | Admitting: Family Medicine

## 2019-03-05 DIAGNOSIS — Z30011 Encounter for initial prescription of contraceptive pills: Secondary | ICD-10-CM

## 2019-03-12 ENCOUNTER — Ambulatory Visit (INDEPENDENT_AMBULATORY_CARE_PROVIDER_SITE_OTHER): Payer: 59 | Admitting: Family Medicine

## 2019-03-12 ENCOUNTER — Encounter: Payer: Self-pay | Admitting: Family Medicine

## 2019-03-12 VITALS — BP 150/98 | HR 74 | Ht 62.0 in | Wt 223.0 lb

## 2019-03-12 DIAGNOSIS — R7303 Prediabetes: Secondary | ICD-10-CM

## 2019-03-12 DIAGNOSIS — Z13220 Encounter for screening for lipoid disorders: Secondary | ICD-10-CM | POA: Diagnosis not present

## 2019-03-12 DIAGNOSIS — R030 Elevated blood-pressure reading, without diagnosis of hypertension: Secondary | ICD-10-CM | POA: Diagnosis not present

## 2019-03-12 DIAGNOSIS — Z3041 Encounter for surveillance of contraceptive pills: Secondary | ICD-10-CM | POA: Diagnosis not present

## 2019-03-12 DIAGNOSIS — Z30011 Encounter for initial prescription of contraceptive pills: Secondary | ICD-10-CM

## 2019-03-12 LAB — POCT URINE PREGNANCY: Preg Test, Ur: NEGATIVE

## 2019-03-12 MED ORDER — LEVONORGEST-ETH ESTRAD 91-DAY 0.15-0.03 MG PO TABS: 1.0000 | ORAL_TABLET | Freq: Every day | ORAL | 3 refills | Status: DC

## 2019-03-12 MED ORDER — LISINOPRIL-HYDROCHLOROTHIAZIDE 10-12.5 MG PO TABS: 1.0000 | ORAL_TABLET | ORAL | 3 refills | Status: DC

## 2019-03-12 NOTE — Progress Notes (Signed)
Established Patient Office Visit  Subjective:  Patient ID: Connie Carlson, female    DOB: 24-Nov-1980  Age: 38 y.o. MRN: [id number]  CC:  Chief Complaint  Patient presents with  . restart birth control    HPI Connie Carlson presents for restart birth control.  She is very happy with her current regimen.  Though she has been off of it for about 2 months and does want to make sure she is pregnant before restarting it.  She does like her pill but did want to know if there was anything potentially that is less hormones.  He does have a history of prediabetes but has not had an A1c checked in years.  She has gained about 7 pounds since I saw her around this time last year and the year before that she had actually gained about 7 pounds.    No past medical history on file.  Past Surgical History:  Procedure Laterality Date  . CHOLECYSTECTOMY      Family History  Problem Relation Age of Onset  . Diabetes Unknown     Social History   Socioeconomic History  . Marital status: Married    Spouse name: Not on file  . Number of children: 1  . Years of education: Not on file  . Highest education level: Not on file  Occupational History  . Not on file  Social Needs  . Financial resource strain: Not on file  . Food insecurity    Worry: Not on file    Inability: Not on file  . Transportation needs    Medical: Not on file    Non-medical: Not on file  Tobacco Use  . Smoking status: Never Smoker  . Smokeless tobacco: Never Used  Substance and Sexual Activity  . Alcohol use: No  . Drug use: No  . Sexual activity: Yes    Partners: Male  Lifestyle  . Physical activity    Days per week: Not on file    Minutes per session: Not on file  . Stress: Not on file  Relationships  . Social Herbalist on phone: Not on file    Gets together: Not on file    Attends religious service: Not on file    Active member of club or organization: Not on file    Attends meetings of clubs or  organizations: Not on file    Relationship status: Not on file  . Intimate partner violence    Fear of current or ex partner: Not on file    Emotionally abused: Not on file    Physically abused: Not on file    Forced sexual activity: Not on file  Other Topics Concern  . Not on file  Social History Narrative  . Not on file    Outpatient Medications Prior to Visit  Medication Sig Dispense Refill  . levonorgestrel-ethinyl estradiol (SEASONALE,INTROVALE,JOLESSA) 0.15-0.03 MG tablet Take 1 tablet by mouth daily. 91 tablet 3   No facility-administered medications prior to visit.     Allergies  Allergen Reactions  . Topiramate Other (See Comments)    Leg cramps    ROS Review of Systems    Objective:    Physical Exam  Constitutional: She is oriented to person, place, and time. She appears well-developed and well-nourished.  HENT:  Head: Normocephalic and atraumatic.  Cardiovascular: Normal rate, regular rhythm and normal heart sounds.  Pulmonary/Chest: Effort normal and breath sounds normal.  Neurological: She is alert and  oriented to person, place, and time.  Skin: Skin is warm and dry.  Psychiatric: She has a normal mood and affect. Her behavior is normal.    BP (!) 150/98 (Cuff Size: Large)   Pulse 74   Ht 5\' 2"  (1.575 m)   Wt 223 lb (101.2 kg)   SpO2 98%   BMI 40.79 kg/m  Wt Readings from Last 3 Encounters:  03/12/19 223 lb (101.2 kg)  01/27/18 216 lb (98 kg)  09/28/16 209 lb (94.8 kg)     There are no preventive care reminders to display for this patient.  There are no preventive care reminders to display for this patient.  Lab Results  Component Value Date   TSH 1.794 10/08/2013   Lab Results  Component Value Date   WBC 8.0 10/08/2013   HGB 13.5 10/08/2013   HCT 41.0 10/08/2013   MCV 88.0 10/08/2013   PLT 344 10/08/2013   Lab Results  Component Value Date   NA 138 05/06/2015   K 4.2 05/06/2015   CO2 25 05/06/2015   GLUCOSE 88 05/06/2015    BUN 11 05/06/2015   CREATININE 0.80 05/06/2015   BILITOT 0.4 05/06/2015   ALKPHOS 69 05/06/2015   AST 47 (H) 05/06/2015   ALT 85 (H) 05/06/2015   PROT 7.4 05/06/2015   ALBUMIN 4.1 05/06/2015   CALCIUM 9.9 05/06/2015   Lab Results  Component Value Date   CHOL 136 05/06/2015   Lab Results  Component Value Date   HDL 37 (L) 05/06/2015   Lab Results  Component Value Date   LDLCALC 80 05/06/2015   Lab Results  Component Value Date   TRIG 95 05/06/2015   Lab Results  Component Value Date   CHOLHDL 3.7 05/06/2015   Lab Results  Component Value Date   HGBA1C 5.6 05/06/2015      Assessment & Plan:   Problem List Items Addressed This Visit      Other   Prediabetes   Relevant Orders   HgB A1c    Other Visit Diagnoses    Encounter for surveillance of contraceptive pills    -  Primary   Relevant Orders   POCT urine pregnancy (Completed)   Screening, lipid       Relevant Orders   Lipid panel   Elevated BP without diagnosis of hypertension       Relevant Orders   HgB A1c   Lipid panel   COMPLETE METABOLIC PANEL WITH GFR   CBC   TSH   Encounter for initial prescription of contraceptive pills       Relevant Medications   levonorgestrel-ethinyl estradiol (SEASONALE) 0.15-0.03 MG tablet      Elevated blood pressure without diagnosis of hypertension-blood pressure is quite elevated today we will recheck it before she leaves.  She can also get it checked at work through a nurse there.  She is off of birth control so notes not a side effect of the medication.  Plan to check her lipid levels as well.  Suspect the increase is probably directly related to her weight gain.  She does not feel particularly stressed but she has been noticing more frequent headaches than usual.  We will go ahead and start lisinopril HCTZ today.  Contraceptive counseling-urine pregnancy negative today.  We will go ahead and restart birth control.  We did discuss possibly the NuvaRing which is less  hormone but she still opted for a pill.  Will refill for 1 year.  Her Pap  smear is up-to-date she will be due next year.  IFG  -we will plan to check A1c on labs as well.  I am concerned with her weight gain that her A1c may be into the prediabetic range again.  Just encouraged her to continue to work on healthy diet and regular exercise. Lab Results  Component Value Date   HGBA1C 5.6 05/06/2015     Meds ordered this encounter  Medications  . levonorgestrel-ethinyl estradiol (SEASONALE) 0.15-0.03 MG tablet    Sig: Take 1 tablet by mouth daily.    Dispense:  91 tablet    Refill:  3  . lisinopril-hydrochlorothiazide (ZESTORETIC) 10-12.5 MG tablet    Sig: Take 1 tablet by mouth every morning.    Dispense:  30 tablet    Refill:  3    Follow-up: Return in about 4 weeks (around 04/09/2019) for check BP with nurse visit. Beatrice Lecher, MD

## 2019-03-14 LAB — COMPLETE METABOLIC PANEL WITH GFR
AG Ratio: 1.4 (calc) (ref 1.0–2.5)
ALT: 14 U/L (ref 6–29)
AST: 16 U/L (ref 10–30)
Albumin: 3.9 g/dL (ref 3.6–5.1)
Alkaline phosphatase (APISO): 76 U/L (ref 31–125)
BUN: 13 mg/dL (ref 7–25)
CO2: 26 mmol/L (ref 20–32)
Calcium: 9.5 mg/dL (ref 8.6–10.2)
Chloride: 105 mmol/L (ref 98–110)
Creat: 0.74 mg/dL (ref 0.50–1.10)
GFR, Est African American: 119 mL/min/{1.73_m2} (ref >=60)
GFR, Est Non African American: 103 mL/min/{1.73_m2} (ref >=60)
Globulin: 2.8 g/dL (calc) (ref 1.9–3.7)
Glucose, Bld: 94 mg/dL (ref 65–99)
Potassium: 4.5 mmol/L (ref 3.5–5.3)
Sodium: 139 mmol/L (ref 135–146)
Total Bilirubin: 0.4 mg/dL (ref 0.2–1.2)
Total Protein: 6.7 g/dL (ref 6.1–8.1)

## 2019-03-14 LAB — LIPID PANEL
Cholesterol: 160 mg/dL (ref <200)
HDL: 50 mg/dL (ref >=50)
LDL Cholesterol (Calc): 91 mg/dL (calc)
Non-HDL Cholesterol (Calc): 110 mg/dL (calc) (ref <130)
Total CHOL/HDL Ratio: 3.2 (calc) (ref <5.0)
Triglycerides: 94 mg/dL (ref <150)

## 2019-03-14 LAB — CBC
HCT: 41.7 % (ref 35.0–45.0)
Hemoglobin: 13.6 g/dL (ref 11.7–15.5)
MCH: 29.6 pg (ref 27.0–33.0)
MCHC: 32.6 g/dL (ref 32.0–36.0)
MCV: 90.7 fL (ref 80.0–100.0)
MPV: 9.9 fL (ref 7.5–12.5)
Platelets: 298 10*3/uL (ref 140–400)
RBC: 4.6 10*6/uL (ref 3.80–5.10)
RDW: 12.7 % (ref 11.0–15.0)
WBC: 8.9 10*3/uL (ref 3.8–10.8)

## 2019-03-14 LAB — HEMOGLOBIN A1C
Hgb A1c MFr Bld: 5.4 % of total Hgb (ref <5.7)
Mean Plasma Glucose: 108 (calc)
eAG (mmol/L): 6 (calc)

## 2019-03-14 LAB — TSH: TSH: 0.98 mIU/L

## 2019-04-10 ENCOUNTER — Ambulatory Visit: Payer: 59

## 2019-05-24 ENCOUNTER — Telehealth: Payer: Self-pay

## 2019-05-24 DIAGNOSIS — N644 Mastodynia: Secondary | ICD-10-CM

## 2019-05-24 NOTE — Telephone Encounter (Signed)
They will want to do both for thorough workup. I ordered both. I want her to go to the breast center for this so the doc can examine her as well. Please have Jenny Reichmann get this scheduled ASAP

## 2019-05-24 NOTE — Telephone Encounter (Signed)
Connie Carlson called and states the problem with her left breast is worse. She states she spoke with Dr Madilyn Fireman at the last visit. I don't see any information in the note. Should she have a visit to discuss.

## 2019-05-24 NOTE — Telephone Encounter (Signed)
I apologize I just do not remember the details and unfortunately I did not put much in my note.  So if we can get a little bit more detail on what is going on.  She likely will need some imaging but I just want to make sure.  If you can give [place] more detail that would be great.

## 2019-05-24 NOTE — Telephone Encounter (Signed)
Patient states she has been having a lot of pain about once a month but steady for the last 2 weeks daily. States that she is unable to even touch the left breast. Most pain from top of breast to center. On the left side (closest to arm) there seems to be a dimple.   Wanting to do an ultrasound before a mammogram because mammograms are very painful

## 2019-05-25 NOTE — Telephone Encounter (Signed)
Pt advised.

## 2019-05-25 NOTE — Telephone Encounter (Signed)
Left VM with update. Order sent to [facility] center.

## 2019-06-01 ENCOUNTER — Other Ambulatory Visit: Payer: Self-pay

## 2019-06-01 ENCOUNTER — Encounter: Payer: Self-pay | Admitting: Physician Assistant

## 2019-06-01 ENCOUNTER — Ambulatory Visit (INDEPENDENT_AMBULATORY_CARE_PROVIDER_SITE_OTHER): Payer: Managed Care, Other (non HMO) | Admitting: Physician Assistant

## 2019-06-01 VITALS — BP 143/69 | HR 75 | Temp 99.3°F | Ht 62.0 in | Wt 227.0 lb

## 2019-06-01 DIAGNOSIS — G43009 Migraine without aura, not intractable, without status migrainosus: Secondary | ICD-10-CM

## 2019-06-01 DIAGNOSIS — R309 Painful micturition, unspecified: Secondary | ICD-10-CM | POA: Diagnosis not present

## 2019-06-01 DIAGNOSIS — N3001 Acute cystitis with hematuria: Secondary | ICD-10-CM | POA: Diagnosis not present

## 2019-06-01 LAB — POCT URINALYSIS DIPSTICK
Bilirubin, UA: NEGATIVE
Glucose, UA: NEGATIVE
Ketones, UA: NEGATIVE
Nitrite, UA: NEGATIVE
Protein, UA: NEGATIVE
Spec Grav, UA: 1.025 (ref 1.010–1.025)
Urobilinogen, UA: 0.2 E.U./dL
pH, UA: 6 (ref 5.0–8.0)

## 2019-06-01 MED ORDER — RIZATRIPTAN BENZOATE 5 MG PO TABS: 5.0000 mg | ORAL_TABLET | ORAL | 0 refills | Status: DC | PRN

## 2019-06-01 MED ORDER — NITROFURANTOIN MONOHYD MACRO 100 MG PO CAPS: 100.0000 mg | ORAL_CAPSULE | Freq: Two times a day (BID) | ORAL | 0 refills | Status: DC

## 2019-06-01 MED ORDER — PHENAZOPYRIDINE HCL 200 MG PO TABS: 200.0000 mg | ORAL_TABLET | Freq: Three times a day (TID) | ORAL | 0 refills | Status: AC

## 2019-06-01 NOTE — Progress Notes (Addendum)
Subjective:    Patient ID: Connie Carlson, female    DOB: Dec 14, 1980, 38 y.o.   MRN: [id number]  HPI  Patient complains of dysuria for the past week. She states the pain starts when urinating and is present in LLQ, suprapubic region, RLQ, and lower back. The pain lasts for about 10 minutes after. Admits to increased urgency, frequency, retention and nocturia. Denies incontinence, gross hematuria, and fever. Patient denies this ever happening before. She has taken OTC Azo and ibuprofen with minimal relief.    Patient also mentioned that she has recently had increasing headaches for the last 3-4 months and previously 1 every 3-4 months. She stated that she was prescribed Topamax approx. 2 years ago for migraines, but stopped taking it after 1-2 weeks. States that she is having now having headaches localized to the right side of her head approximately 3-4 times a week. She takes ibuprofen and lies down for little relief. Denies vision changes, nausea, vomiting. No auras. She is light sensitive. Unknown trigger. No medication changes.   .. Active Ambulatory Problems    Diagnosis Date Noted  . Obesity 06/28/2006  . MIGRAINE HEADACHE 11/15/2007  . Allergic rhinitis, cause unspecified 06/28/2006  . GASTROESOPHAGEAL REFLUX, NO ESOPHAGITIS 06/28/2006  . WEIGHT GAIN 01/01/2009  . AMENORRHEA 11/18/2010  . Migraine headache without aura 02/16/2013  . IFG (impaired fasting glucose) 10/08/2013  . Prediabetes 05/06/2015   Resolved Ambulatory Problems    Diagnosis Date Noted  . Acute pharyngitis 03/05/2009  . INFLUENZA 09/22/2009  . GASTRITIS, ACUTE 06/28/2006  . HAIR LOSS 12/03/2009  . Dysuria 11/15/2007  . PARESTHESIA 11/18/2010   No Additional Past Medical History     Review of Systems General: denies fever GI: denies bowel changes, nausea, vomiting  GU: see HPI      Objective:   Physical Exam Vitals signs reviewed.  Constitutional:      Appearance: Normal appearance. She is obese.   Eyes:     Extraocular Movements: Extraocular movements intact.     Conjunctiva/sclera: Conjunctivae normal.     Pupils: Pupils are equal, round, and reactive to light.  Cardiovascular:     Rate and Rhythm: Normal rate and regular rhythm.     Pulses: Normal pulses.  Pulmonary:     Effort: Pulmonary effort is normal.     Breath sounds: Normal breath sounds.  Abdominal:     General: Bowel sounds are normal. There is no distension.     Palpations: Abdomen is soft.     Tenderness: There is abdominal tenderness. There is no right CVA tenderness, left CVA tenderness, guarding or rebound.  Neurological:     General: No focal deficit present.     Mental Status: She is alert and oriented to person, place, and time.  Psychiatric:        Mood and Affect: Mood normal.     Upon palpation, there was minimal suprapubic discomfort. There was no tenderness in the LLQ and RLQ. No CVA tenderness.   Urine was positive for leukocytes and blood, no nitrates present.    Assessment & Plan:   .Marland KitchenKemauri was seen today for urinary tract infection.  Diagnoses and all orders for this visit:  Acute cystitis with hematuria -     nitrofurantoin, macrocrystal-monohydrate, (MACROBID) 100 MG capsule; Take 1 capsule (100 mg total) by mouth 2 (two) times daily. -     phenazopyridine (PYRIDIUM) 200 MG tablet; Take 1 tablet (200 mg total) by mouth 3 (three) times daily for  2 days.  Pain with urination -     POCT urinalysis dipstick -     Urine Culture  Migraine without aura and without status migrainosus, not intractable -     rizatriptan (MAXALT) 5 MG tablet; Take 1 tablet (5 mg total) by mouth as needed for migraine. May repeat in 2 hours if needed   UA positive for blood and leuks. Will emprically treat with pyridium and macrobid. Culture to follow.   Migraines are worsening. Encouraged patient to start headache diary to look for triggers. NSAID does not seem to be helping as much. Sent maxalt to use as  needed for migraine prevention. Discussed frequency and how a preventative is needed if continues having 3-4 migraines a week. Discussed propranolol, Elavil, and amiovig. She will follow up with PCP in 3-4 weeks if migraines persist.  .Connie Honey PA-C, have reviewed and agree with the above documentation in it's entirety.

## 2019-06-01 NOTE — Patient Instructions (Signed)
maxalt as needed for migraine rescue.    Migraine Headache A migraine headache is an intense, throbbing pain on one side or both sides of the head. Migraine headaches may also cause other symptoms, such as nausea, vomiting, and sensitivity to light and noise. A migraine headache can last from 4 hours to 3 days. Talk with your doctor about what things may bring on (trigger) your migraine headaches. What are the causes? The exact cause of this condition is not known. However, a migraine may be caused when nerves in the brain become irritated and release chemicals that cause inflammation of blood vessels. This inflammation causes pain. This condition may be triggered or caused by:  Drinking alcohol.  Smoking.  Taking medicines, such as: ? Medicine used to treat chest pain (nitroglycerin). ? Birth control pills. ? Estrogen. ? Certain blood pressure medicines.  Eating or drinking products that contain nitrates, glutamate, aspartame, or tyramine. Aged cheeses, chocolate, or caffeine may also be triggers.  Doing physical activity. Other things that may trigger a migraine headache include:  Menstruation.  Pregnancy.  Hunger.  Stress.  Lack of sleep or too much sleep.  Weather changes.  Fatigue. What increases the risk? The following factors may make you more likely to experience migraine headaches:  Being a certain age. This condition is more common in people who are 30-30 years old.  Being female.  Having a family history of migraine headaches.  Being Caucasian.  Having a mental health condition, such as depression or anxiety.  Being obese. What are the signs or symptoms? The main symptom of this condition is pulsating or throbbing pain. This pain may:  Happen in any area of the head, such as on one side or both sides.  Interfere with daily activities.  Get worse with physical activity.  Get worse with exposure to bright lights or loud noises. Other symptoms may  include:  Nausea.  Vomiting.  Dizziness.  General sensitivity to bright lights, loud noises, or smells. Before you get a migraine headache, you may get warning signs (an aura). An aura may include:  Seeing flashing lights or having blind spots.  Seeing bright spots, halos, or zigzag lines.  Having tunnel vision or blurred vision.  Having numbness or a tingling feeling.  Having trouble talking.  Having muscle weakness. Some people have symptoms after a migraine headache (postdromal phase), such as:  Feeling tired.  Difficulty concentrating. How is this diagnosed? A migraine headache can be diagnosed based on:  Your symptoms.  A physical exam.  Tests, such as: ? CT scan or an MRI of the head. These imaging tests can help rule out other causes of headaches. ? Taking fluid from the spine (lumbar puncture) and analyzing it (cerebrospinal fluid analysis, or CSF analysis). How is this treated? This condition may be treated with medicines that:  Relieve pain.  Relieve nausea.  Prevent migraine headaches. Treatment for this condition may also include:  Acupuncture.  Lifestyle changes like avoiding foods that trigger migraine headaches.  Biofeedback.  Cognitive behavioral therapy. Follow these instructions at home: Medicines  Take over-the-counter and prescription medicines only as told by your health care provider.  Ask your health care provider if the medicine prescribed to you: ? Requires you to avoid driving or using heavy machinery. ? Can cause constipation. You may need to take these actions to prevent or treat constipation:  Drink enough fluid to keep your urine pale yellow.  Take over-the-counter or prescription medicines.  Eat foods that are  high in fiber, such as beans, whole grains, and fresh fruits and vegetables.  Limit foods that are high in fat and processed sugars, such as fried or sweet foods. Lifestyle  Do not drink alcohol.  Do not  use any products that contain nicotine or tobacco, such as cigarettes, e-cigarettes, and chewing tobacco. If you need help quitting, ask your health care provider.  Get at least 8 hours of sleep every night.  Find ways to manage stress, such as meditation, deep breathing, or yoga. General instructions      Keep a journal to find out what may trigger your migraine headaches. For example, write down: ? What you eat and drink. ? How much sleep you get. ? Any change to your diet or medicines.  If you have a migraine headache: ? Avoid things that make your symptoms worse, such as bright lights. ? It may help to lie down in a dark, quiet room. ? Do not drive or use heavy machinery. ? Ask your health care provider what activities are safe for you while you are experiencing symptoms.  Keep all follow-up visits as told by your health care provider. This is important. Contact a health care provider if:  You develop symptoms that are different or more severe than your usual migraine headache symptoms.  You have more than 15 headache days in one month. Get help right away if:  Your migraine headache becomes severe.  Your migraine headache lasts longer than 72 hours.  You have a fever.  You have a stiff neck.  You have vision loss.  Your muscles feel weak or like you cannot control them.  You start to lose your balance often.  You have trouble walking.  You faint.  You have a seizure. Summary  A migraine headache is an intense, throbbing pain on one side or both sides of the head. Migraines may also cause other symptoms, such as nausea, vomiting, and sensitivity to light and noise.  This condition may be treated with medicines and lifestyle changes. You may also need to avoid certain things that trigger a migraine headache.  Keep a journal to find out what may trigger your migraine headaches.  Contact your health care provider if you have more than 15 headache days in a  month or you develop symptoms that are different or more severe than your usual migraine headache symptoms. This information is not intended to replace advice given to you by your health care provider. Make sure you discuss any questions you have with your health care provider. Document Released: 09/06/2005 Document Revised: 12/29/2018 Document Reviewed: 10/19/2018 Elsevier Patient Education  2020 [employer].

## 2019-06-04 ENCOUNTER — Encounter: Payer: Self-pay | Admitting: Physician Assistant

## 2019-06-04 LAB — URINE CULTURE
MICRO NUMBER:: 872141
SPECIMEN QUALITY:: ADEQUATE

## 2019-06-04 MED ORDER — SULFAMETHOXAZOLE-TRIMETHOPRIM 800-160 MG PO TABS: 1.0000 | ORAL_TABLET | Freq: Two times a day (BID) | ORAL | 0 refills | Status: DC

## 2019-06-04 NOTE — Addendum Note (Signed)
Addended by: Donella Stade on: 06/04/2019 01:13 PM   Modules accepted: Orders

## 2019-06-04 NOTE — Progress Notes (Signed)
Culture showed 2 bacterias present. Macrobid will not fully treat. Stop macrobid and start bactrim for 7 days. Follow up as needed.

## 2019-06-20 ENCOUNTER — Ambulatory Visit
Admission: RE | Admit: 2019-06-20 | Discharge: 2019-06-20 | Disposition: A | Discharge status: Home / Self Care | Payer: 59 | Source: Ambulatory Visit | Attending: Family Medicine | Admitting: Family Medicine

## 2019-06-20 ENCOUNTER — Other Ambulatory Visit: Payer: Self-pay

## 2019-06-20 ENCOUNTER — Ambulatory Visit: Admission: RE | Admit: 2019-06-20 | Payer: Managed Care, Other (non HMO) | Source: Ambulatory Visit

## 2019-06-20 DIAGNOSIS — N644 Mastodynia: Secondary | ICD-10-CM

## 2019-12-26 ENCOUNTER — Encounter: Payer: Self-pay | Admitting: Medical-Surgical

## 2019-12-26 ENCOUNTER — Telehealth (INDEPENDENT_AMBULATORY_CARE_PROVIDER_SITE_OTHER): Payer: 59 | Admitting: Medical-Surgical

## 2019-12-26 DIAGNOSIS — G43009 Migraine without aura, not intractable, without status migrainosus: Secondary | ICD-10-CM

## 2019-12-26 DIAGNOSIS — J302 Other seasonal allergic rhinitis: Secondary | ICD-10-CM

## 2019-12-26 DIAGNOSIS — H1013 Acute atopic conjunctivitis, bilateral: Secondary | ICD-10-CM

## 2019-12-26 MED ORDER — OLOPATADINE HCL 0.1 % OP SOLN: 1.0000 [drp] | Freq: Two times a day (BID) | OPHTHALMIC | 5 refills | Status: DC

## 2019-12-26 MED ORDER — PREDNISONE 50 MG PO TABS: 50.0000 mg | ORAL_TABLET | Freq: Every day | ORAL | 0 refills | Status: DC

## 2019-12-26 MED ORDER — OLOPATADINE HCL 0.1 % OP SOLN: 1.0000 [drp] | Freq: Two times a day (BID) | OPHTHALMIC | 12 refills | Status: DC

## 2019-12-26 NOTE — Progress Notes (Signed)
Virtual Visit via Video Note  I connected with Connie Carlson on 12/26/19 at 10:10 AM EDT by a video enabled telemedicine application and verified that I am speaking with the correct person using two identifiers.   I discussed the limitations of evaluation and management by telemedicine and the availability of in person appointments. The patient expressed understanding and agreed to proceed.  Subjective:    CC: migraine, allergies  HPI:  Very pleasant 39 year old female presenting today via MyChart video visit to discuss migraine headaches and severe exacerbation of seasonal allergies.  Reports she is having itchy nose, itchy throat, coughing, sinus congestion, postnasal drip.  Also has itching and dryness of bilateral eyes and feels as if she "has glass in them".  Taking Allegra daily for allergy management as that she is to be the most helpful but does not feel this is helping now.  History of migraines without aura.  Usually uses Advil liquid gels with good relief.  Today rated 4/10, located over bilateral frontal and ethmoid sinuses.  Has been taking any medication today so far.  Has Maxalt ordered but only uses this for emergency because she feels like she cannot think clearly after taking this medication.  Past medical history, Surgical history, Family history not pertinant except as noted below, Social history, Allergies, and medications have been entered into the medical record, reviewed, and corrections made.   Review of Systems: No fevers, chills, night sweats, weight loss, chest pain, or shortness of breath.   Objective:    General: Speaking clearly in complete sentences without any shortness of breath.  Alert and oriented x3.  Normal judgment. No apparent acute distress.    Impression and Recommendations:    1. Allergic conjunctivitis of both eyes/seasonal allergies Recommend switching antihistamine from Allegra to another over-the-counter brand such as Zyrtec, Claritin, or  Xyzal.  Sending in Pataday eyedrops to use 1 drop to each eye twice daily.  Due to severity of symptoms we will do prednisone 50 mg daily x5 days. - predniSONE (DELTASONE) 50 MG tablet; Take 1 tablet (50 mg total) by mouth daily.  Dispense: 5 tablet; Refill: 0 - olopatadine (PATADAY) 0.1 % ophthalmic solution; Place 1 drop into both eyes 2 (two) times daily.  Dispense: 5 mL; Refill: 12  2. Migraine without aura and without status migrainosus, not intractable Strongly suspect seasonal allergy exacerbation contributing to headache today.  See above instructions to manage seasonal allergies.  Recommend taking Advil liquid gel as needed with use of Maxalt and emergency cases.  Has trialed several medications for migraine management but has significant side effects.  No change in regimen today. - predniSONE (DELTASONE) 50 MG tablet; Take 1 tablet (50 mg total) by mouth daily.  Dispense: 5 tablet; Refill: 0 - olopatadine (PATADAY) 0.1 % ophthalmic solution; Place 1 drop into both eyes 2 (two) times daily.  Dispense: 5 mL; Refill: 12  Return if symptoms worsen or fail to improve.  I discussed the assessment and treatment plan with the patient. The patient was provided an opportunity to ask questions and all were answered. The patient agreed with the plan and demonstrated an understanding of the instructions.   The patient was advised to call back or seek an in-person evaluation if the symptoms worsen or if the condition fails to improve as anticipated.  22 minutes of non-face-to-face time was provided during this encounter.   Clearnce Sorrel, DNP, APRN, FNP-BC [facility]

## 2019-12-26 NOTE — Addendum Note (Signed)
Addended bySamuel Bouche on: 12/26/2019 10:32 AM   Modules accepted: Orders

## 2020-01-31 ENCOUNTER — Telehealth: Payer: Self-pay | Admitting: *Deleted

## 2020-01-31 NOTE — Telephone Encounter (Signed)
Form completed, copy made and scanned into patient's chart.  Pt called and informed that she will need to come in and pay for the form completion to receive her forms. She voiced understanding and will come by later this week to pay for form completion.

## 2020-03-19 ENCOUNTER — Ambulatory Visit: Payer: 59 | Admitting: Family Medicine

## 2020-03-20 ENCOUNTER — Other Ambulatory Visit: Payer: Self-pay | Admitting: Family Medicine

## 2020-03-20 DIAGNOSIS — Z30011 Encounter for initial prescription of contraceptive pills: Secondary | ICD-10-CM

## 2020-04-04 ENCOUNTER — Ambulatory Visit: Payer: 59 | Admitting: Family Medicine

## 2020-05-07 ENCOUNTER — Ambulatory Visit: Payer: 59 | Admitting: Family Medicine

## 2020-05-16 ENCOUNTER — Ambulatory Visit: Payer: 59 | Admitting: Medical-Surgical

## 2020-05-21 ENCOUNTER — Encounter: Payer: Self-pay | Admitting: Medical-Surgical

## 2020-05-21 ENCOUNTER — Telehealth (INDEPENDENT_AMBULATORY_CARE_PROVIDER_SITE_OTHER): Payer: 59 | Admitting: Medical-Surgical

## 2020-05-21 DIAGNOSIS — A084 Viral intestinal infection, unspecified: Secondary | ICD-10-CM | POA: Diagnosis not present

## 2020-05-21 DIAGNOSIS — G43009 Migraine without aura, not intractable, without status migrainosus: Secondary | ICD-10-CM | POA: Diagnosis not present

## 2020-05-21 MED ORDER — RIZATRIPTAN BENZOATE 5 MG PO TABS: 5.0000 mg | ORAL_TABLET | ORAL | 0 refills | Status: DC | PRN

## 2020-05-21 MED ORDER — ONDANSETRON 8 MG PO TBDP: 8.0000 mg | ORAL_TABLET | Freq: Three times a day (TID) | ORAL | 3 refills | Status: DC | PRN

## 2020-05-21 NOTE — Progress Notes (Signed)
Virtual Visit via Video Note  I connected with Connie Carlson on 05/21/20 at 10:10 AM EDT by a video enabled telemedicine application and verified that I am speaking with the correct person using two identifiers.   I discussed the limitations of evaluation and management by telemedicine and the availability of in person appointments. The patient expressed understanding and agreed to proceed.  Patient location: home Provider locations: office  Subjective:    CC: nausea, migraines  HPI: Pleasant 39 year old female presenting via MyChart video visit with complaints of nausea and migraines.  Nausea-reports that she has had regular nausea for the past 2 days accompanied by some diarrhea.  Every time she eats she feels like she is going to vomit.  She had two episodes of emesis yesterday where she vomited food that she had just tried to eat.  No blood in her emesis.  She has had 5-6 bowel movements daily over the past 2 days, no melena or hematochezia.  Low-grade fever T-max 99.9 with intermittent chills and headache.  Denies shortness of breath, chest pain, sinus congestion, cough.  Took Alka-Seltzer last night for nausea which was helpful.  She is also been drinking water with lemon and small amounts of ginger ale.  She has not had any known sick contacts but is exposed to the public regularly at work.  Migraines-history of migraines that was previously treated with Maxalt and Topamax.  Unfortunately she had significant side effects with Topamax and this was added to her allergy list.  Since then she has not been taking any prophylactic medication.  She endorses headaches approximately every 2 to 3 days that are fairly manageable but once every 1 to 2 weeks she will experience a severe migraine.  Her migraines are accompanied by blurred vision, nausea, photophobia, phonophobia, and fatigue.  Reports that regular ibuprofen is not helpful but she finds relief with Advil liquid gels 3-4 caps as needed.  She  has used Maxalt which did help her severe headaches but uses this sparingly as she feels this is a very strong medication.     Past medical history, Surgical history, Family history not pertinant except as noted below, Social history, Allergies, and medications have been entered into the medical record, reviewed, and corrections made.   Review of Systems: See HPI for pertinent positives and negatives.   Objective:    General: Speaking clearly in complete sentences without any shortness of breath.  Alert and oriented x3.  Normal judgment. No apparent acute distress.  Impression and Recommendations:    1. Viral gastroenteritis Suspect nausea with diarrhea is a viral gastroenteritis.  Discussed eating a bland diet as tolerated.  Continue drinking plenty of fluids to stay well-hydrated.  Sending in Zofran 8 mg ODT every 8 hours as needed for nausea.  If this is not helpful, may consider sending Phenergan.  Advised patient is will cause sedation and to use sparingly if needing to work.  2. Migraine without aura and without status migrainosus, not intractable Patient would greatly benefit from a prophylactic medication but has had significant side effects to both Topamax and atenolol in the past.  We will need to investigate options for medication that will likely have fewer side effects and still help with her headache frequency.  For now continue Advil liquid gels.  Also sending in a refill of rizatriptan for use during severe headaches. - rizatriptan (MAXALT) 5 MG tablet; Take 1 tablet (5 mg total) by mouth as needed for migraine. May repeat in  2 hours if needed  Dispense: 10 tablet; Refill: 0  Return if symptoms worsen or fail to improve.  20 minutes of non-face-to-face time was provided during this encounter.  I discussed the assessment and treatment plan with the patient. The patient was provided an opportunity to ask questions and all were answered. The patient agreed with the plan and  demonstrated an understanding of the instructions.   The patient was advised to call back or seek an in-person evaluation if the symptoms worsen or if the condition fails to improve as anticipated.   Clearnce Sorrel, DNP, APRN, FNP-BC [facility]

## 2020-05-23 ENCOUNTER — Telehealth: Payer: Self-pay

## 2020-05-23 NOTE — Telephone Encounter (Signed)
Connie Carlson called and states the COVID test came back negative. She states she still has an upset stomach. She wanted to know if she should return to work tomorrow? If so she needs a work note. Please advise.

## 2020-05-23 NOTE — Telephone Encounter (Signed)
Task completed. Pt is aware and agreeable with provider's recommendation. Pt is requesting a work note to pick up later on today.

## 2020-05-23 NOTE — Telephone Encounter (Signed)
If she is still having the upset stomach/diarrhea, I would advise she stay home tomorrow since I suspect she has a viral gastroenteritis and this can be contagious. I would recommend returning to work once there is no more diarrhea.

## 2020-05-28 ENCOUNTER — Ambulatory Visit: Payer: 59 | Admitting: Family Medicine

## 2020-05-29 ENCOUNTER — Other Ambulatory Visit: Payer: Self-pay | Admitting: Medical-Surgical

## 2020-05-29 MED ORDER — METOPROLOL SUCCINATE ER 25 MG PO TB24
25.0000 mg | ORAL_TABLET | Freq: Every day | ORAL | 0 refills | Status: DC
Start: 2020-05-29 — End: 2020-06-11

## 2020-05-29 NOTE — Progress Notes (Signed)
Pt aware of Joy's recommendations and is agreeable to plan. She is scheduled to see Dr. Madilyn Fireman on 06/11/2020. No further questions or concerns at this time.

## 2020-05-29 NOTE — Progress Notes (Signed)
LVMTRC (1st attempt)   

## 2020-05-29 NOTE — Progress Notes (Signed)
Discussed migraine prophylaxis medications at our virtual visit last week on 05/21/2020. After review of previously attempted medications and recommended prophylactic agents, Toprol-XL seems to be a good choice with fewer side effects yet good efficacy. Sending a 30 day supply to the [facility] for her to try. She will just take 1 tablet daily.  Starting on 25mg  daily which is a low dose to evaluate tolerance. May need a higher dose for adequate migraine prophylaxis. She will need to follow up with Dr. Madilyn Fireman in 3 weeks (okay to be virtual) to evaluate her tolerance to the medication and see if there are side effects or dose adjustments needed.

## 2020-06-11 ENCOUNTER — Encounter: Payer: Self-pay | Admitting: Family Medicine

## 2020-06-11 ENCOUNTER — Other Ambulatory Visit: Payer: Self-pay

## 2020-06-11 ENCOUNTER — Ambulatory Visit (INDEPENDENT_AMBULATORY_CARE_PROVIDER_SITE_OTHER): Payer: 59 | Admitting: Family Medicine

## 2020-06-11 VITALS — BP 158/85 | HR 62 | Temp 99.5°F | Wt 237.0 lb

## 2020-06-11 DIAGNOSIS — R030 Elevated blood-pressure reading, without diagnosis of hypertension: Secondary | ICD-10-CM

## 2020-06-11 DIAGNOSIS — Z6841 Body Mass Index (BMI) 40.0 and over, adult: Secondary | ICD-10-CM | POA: Insufficient documentation

## 2020-06-11 DIAGNOSIS — R635 Abnormal weight gain: Secondary | ICD-10-CM | POA: Diagnosis not present

## 2020-06-11 DIAGNOSIS — Z13220 Encounter for screening for lipoid disorders: Secondary | ICD-10-CM | POA: Diagnosis not present

## 2020-06-11 DIAGNOSIS — R7301 Impaired fasting glucose: Secondary | ICD-10-CM | POA: Diagnosis not present

## 2020-06-11 DIAGNOSIS — I10 Essential (primary) hypertension: Secondary | ICD-10-CM

## 2020-06-11 DIAGNOSIS — G43009 Migraine without aura, not intractable, without status migrainosus: Secondary | ICD-10-CM

## 2020-06-11 DIAGNOSIS — E66813 Obesity, class 3: Secondary | ICD-10-CM | POA: Insufficient documentation

## 2020-06-11 MED ORDER — METOPROLOL SUCCINATE ER 25 MG PO TB24
25.0000 mg | ORAL_TABLET | Freq: Every day | ORAL | 0 refills | Status: DC
Start: 2020-06-11 — End: 2020-10-17

## 2020-06-11 MED ORDER — SAXENDA 18 MG/3ML ~~LOC~~ SOPN: PEN_INJECTOR | SUBCUTANEOUS | 0 refills | Status: AC

## 2020-06-11 MED ORDER — RIZATRIPTAN BENZOATE 5 MG PO TABS: 5.0000 mg | ORAL_TABLET | ORAL | 5 refills | Status: DC | PRN

## 2020-06-11 MED ORDER — INSULIN PEN NEEDLE 32G X 4 MM MISC: 3 refills | Status: DC

## 2020-06-11 MED ORDER — LISINOPRIL-HYDROCHLOROTHIAZIDE 10-12.5 MG PO TABS: 1.0000 | ORAL_TABLET | Freq: Every morning | ORAL | 1 refills | Status: DC

## 2020-06-11 NOTE — Assessment & Plan Note (Signed)
Blood pressure not well controlled.  Did encourage her to take the medication daily and as we continue to work on weight loss then if her blood pressure is coming down then hopefully we would be able to adjust her either discontinue the medication.  Also explained to her that the metoprolol is to actually help with her migraines even though yes it is a blood pressure medication.

## 2020-06-11 NOTE — Progress Notes (Signed)
Established Patient Office Visit  Subjective:  Patient ID: Connie Carlson, female    DOB: [birth year]  Age: 39 y.o. MRN: [id number]  CC:  Chief Complaint  Patient presents with  . Weight Gain  . Migraine    HPI Connie Carlson presents for   Follow-up migraine headaches-she does feel like the Maxalt rescue medication has actually been helping she is also tried to make some lifestyle changes and feels like that has been helpful as well.  She also wants to discuss weight loss options.  Her current weight is 227 pounds.  She did some reading about a particular medication called Saxenda.  She actually tried phentermine and topiramate in the past. She did not like the way those medications made her feel.  Impaired fasting glucose-no increased thirst or urination. No symptoms consistent with hypoglycemia. Stopped drinking soda and added more fruits and veggies.  She has cut back on her carbs.   HTN - She reports she is only been taking the senna pill HCTZ and metoprolol when she does not feel well she has not been taking it daily    History reviewed. No pertinent past medical history.  Past Surgical History:  Procedure Laterality Date  . CHOLECYSTECTOMY      Family History  Problem Relation Age of Onset  . Diabetes Other     Social History   Socioeconomic History  . Marital status: Married    Spouse name: Not on file  . Number of children: 1  . Years of education: Not on file  . Highest education level: Not on file  Occupational History  . Not on file  Tobacco Use  . Smoking status: Never Smoker  . Smokeless tobacco: Never Used  Substance and Sexual Activity  . Alcohol use: No  . Drug use: No  . Sexual activity: Yes    Partners: Male  Other Topics Concern  . Not on file  Social History Narrative  . Not on file   Social Determinants of Health   Financial Resource Strain:   . Difficulty of Paying Living Expenses: Not on file  Food Insecurity:   . Worried About  Charity fundraiser in the Last Year: Not on file  . Ran Out of Food in the Last Year: Not on file  Transportation Needs:   . Lack of Transportation (Medical): Not on file  . Lack of Transportation (Non-Medical): Not on file  Physical Activity:   . Days of Exercise per Week: Not on file  . Minutes of Exercise per Session: Not on file  Stress:   . Feeling of Stress : Not on file  Social Connections:   . Frequency of Communication with Friends and Family: Not on file  . Frequency of Social Gatherings with Friends and Family: Not on file  . Attends Religious Services: Not on file  . Active Member of Clubs or Organizations: Not on file  . Attends Archivist Meetings: Not on file  . Marital Status: Not on file  Intimate Partner Violence:   . Fear of Current or Ex-Partner: Not on file  . Emotionally Abused: Not on file  . Physically Abused: Not on file  . Sexually Abused: Not on file    Outpatient Medications Prior to Visit  Medication Sig Dispense Refill  . BIOTIN PO Take by mouth.    . calcium-vitamin D (OSCAL WITH D) 500-200 MG-UNIT tablet Take 1 tablet by mouth.    . COLLAGEN PO Take by  mouth.    . DAYSEE 0.15-0.03 &0.01 MG tablet TAKE ONE TABLET BY MOUTH ONE TIME DAILY 91 tablet 0  . ondansetron (ZOFRAN-ODT) 8 MG disintegrating tablet Take 1 tablet (8 mg total) by mouth every 8 (eight) hours as needed for nausea. 20 tablet 3  . TURMERIC PO Take by mouth.    Marland Kitchen VITAMIN E PO Take by mouth.    . rizatriptan (MAXALT) 5 MG tablet Take 1 tablet (5 mg total) by mouth as needed for migraine. May repeat in 2 hours if needed 10 tablet 0  . lisinopril-hydrochlorothiazide (ZESTORETIC) 10-12.5 MG tablet TAKE ONE TABLET BY MOUTH IN THE MORNING (Patient not taking: Reported on 05/21/2020) 30 tablet 0  . metoprolol succinate (TOPROL-XL) 25 MG 24 hr tablet Take 1 tablet (25 mg total) by mouth daily. (Patient not taking: Reported on 06/11/2020) 30 tablet 0   No facility-administered  medications prior to visit.    Allergies  Allergen Reactions  . Topiramate Other (See Comments)    Leg cramps    ROS Review of Systems    Objective:    Physical Exam Constitutional:      Appearance: She is well-developed.  HENT:     Head: Normocephalic and atraumatic.  Cardiovascular:     Rate and Rhythm: Normal rate and regular rhythm.     Heart sounds: Normal heart sounds.  Pulmonary:     Effort: Pulmonary effort is normal.     Breath sounds: Normal breath sounds.  Skin:    General: Skin is warm and dry.  Neurological:     Mental Status: She is alert and oriented to person, place, and time.  Psychiatric:        Behavior: Behavior normal.     BP (!) 158/85 (BP Location: Left Arm, Patient Position: Sitting, Cuff Size: Large)   Pulse 62   Temp 99.5 F (37.5 C) (Oral)   Wt 237 lb 0.6 oz (107.5 kg)   BMI 43.36 kg/m  Wt Readings from Last 3 Encounters:  06/11/20 237 lb 0.6 oz (107.5 kg)  06/01/19 227 lb (103 kg)  03/12/19 223 lb (101.2 kg)     Health Maintenance Due  Topic Date Due  . PAP SMEAR-Modifier  05/05/2020    There are no preventive care reminders to display for this patient.  Lab Results  Component Value Date   TSH 0.98 03/13/2019   Lab Results  Component Value Date   WBC 8.9 03/13/2019   HGB 13.6 03/13/2019   HCT 41.7 03/13/2019   MCV 90.7 03/13/2019   PLT 298 03/13/2019   Lab Results  Component Value Date   NA 139 03/13/2019   K 4.5 03/13/2019   CO2 26 03/13/2019   GLUCOSE 94 03/13/2019   BUN 13 03/13/2019   CREATININE 0.74 03/13/2019   BILITOT 0.4 03/13/2019   ALKPHOS 69 05/06/2015   AST 16 03/13/2019   ALT 14 03/13/2019   PROT 6.7 03/13/2019   ALBUMIN 4.1 05/06/2015   CALCIUM 9.5 03/13/2019   Lab Results  Component Value Date   CHOL 160 03/13/2019   Lab Results  Component Value Date   HDL 50 03/13/2019   Lab Results  Component Value Date   LDLCALC 91 03/13/2019   Lab Results  Component Value Date   TRIG 94  03/13/2019   Lab Results  Component Value Date   CHOLHDL 3.2 03/13/2019   Lab Results  Component Value Date   HGBA1C 5.4 03/13/2019      Assessment &  Plan:   Problem List Items Addressed This Visit      Cardiovascular and Mediastinum   RESOLVED: Migraine headache   Relevant Medications   lisinopril-hydrochlorothiazide (ZESTORETIC) 10-12.5 MG tablet   rizatriptan (MAXALT) 5 MG tablet   metoprolol succinate (TOPROL-XL) 25 MG 24 hr tablet   Hypertension - Primary    Blood pressure not well controlled.  Did encourage her to take the medication daily and as we continue to work on weight loss then if her blood pressure is coming down then hopefully we would be able to adjust her either discontinue the medication.  Also explained to her that the metoprolol is to actually help with her migraines even though yes it is a blood pressure medication.      Relevant Medications   lisinopril-hydrochlorothiazide (ZESTORETIC) 10-12.5 MG tablet   metoprolol succinate (TOPROL-XL) 25 MG 24 hr tablet     Endocrine   IFG (impaired fasting glucose)    Due for repeat A1c it looked fantastic a year ago.      Relevant Orders   COMPLETE METABOLIC PANEL WITH GFR   Lipid panel   TSH   CBC   Hemoglobin A1c     Other   WEIGHT GAIN    Discussed options actually think she be a great candidate for Saxenda discussed how the medication works potential side effects.  She would like to start the medication new prescription sent to pharmacy hopefully be covered with her insurance if not she will let me know.  He is not interested in bariatric surgery as an option at this point in time.      Relevant Medications   Liraglutide -Weight Management (SAXENDA) 18 MG/3ML SOPN   Morbid obesity ([place])    See notes below and strategy for weight loss.      Relevant Medications   Liraglutide -Weight Management (SAXENDA) 18 MG/3ML SOPN   BMI 40.0-44.9, adult (HCC)   Relevant Medications   Liraglutide -Weight  Management (SAXENDA) 18 MG/3ML SOPN    Other Visit Diagnoses    Screening, lipid       Relevant Orders   COMPLETE METABOLIC PANEL WITH GFR   Lipid panel   TSH   CBC      Meds ordered this encounter  Medications  . lisinopril-hydrochlorothiazide (ZESTORETIC) 10-12.5 MG tablet    Sig: Take 1 tablet by mouth every morning.    Dispense:  90 tablet    Refill:  1  . rizatriptan (MAXALT) 5 MG tablet    Sig: Take 1 tablet (5 mg total) by mouth as needed for migraine. May repeat in 2 hours if needed    Dispense:  10 tablet    Refill:  5  . Liraglutide -Weight Management (SAXENDA) 18 MG/3ML SOPN    Sig: Inject 0.6 mg into the skin daily for 7 days, THEN 1.2 mg daily for 7 days, THEN 1.8 mg daily for 7 days, THEN 2.4 mg daily for 7 days.    Dispense:  7 mL    Refill:  0  . metoprolol succinate (TOPROL-XL) 25 MG 24 hr tablet    Sig: Take 1 tablet (25 mg total) by mouth daily.    Dispense:  90 tablet    Refill:  0  . Insulin Pen Needle 32G X 4 MM MISC    Sig: Use daily with Saxenda pen    Dispense:  100 each    Refill:  3    Follow-up: Return in about 4 weeks (around 07/09/2020)  for New start medication.    Beatrice Lecher, MD

## 2020-06-11 NOTE — Assessment & Plan Note (Signed)
Migraines overall fairly well controlled.  They did discuss that the metoprolol is really help prevent the migraines.  She says the Maxalt does work well when she uses it.  I will go ahead and refill the medication today.  Follow-up in 6 months.

## 2020-06-11 NOTE — Assessment & Plan Note (Addendum)
Discussed options actually think she be a great candidate for Saxenda discussed how the medication works potential side effects.  She would like to start the medication new prescription sent to pharmacy hopefully be covered with her insurance if not she will let me know.  He is not interested in bariatric surgery as an option at this point in time.

## 2020-06-11 NOTE — Assessment & Plan Note (Signed)
Due for repeat A1c it looked fantastic a year ago.

## 2020-06-11 NOTE — Patient Instructions (Signed)
If for some reason the medication is not covered by your insurance please call the and see what they will cover and then we can choose from there.  Please go ahead and restart your lisinopril HCTZ to control your blood pressure.

## 2020-06-11 NOTE — Assessment & Plan Note (Signed)
See notes below and strategy for weight loss.

## 2020-06-13 ENCOUNTER — Telehealth: Payer: Self-pay | Admitting: Family Medicine

## 2020-06-13 NOTE — Telephone Encounter (Signed)
Received fax for PA on Saxenda sent through cover my meds waiting on determination. - CF 

## 2020-06-16 NOTE — Telephone Encounter (Signed)
Received fax from [facility] and they denied coverage on Saxenda due to patients plan does not cover weight loss medications. I am placing in providers box for review. - CF

## 2020-06-17 ENCOUNTER — Telehealth: Payer: Self-pay | Admitting: Family Medicine

## 2020-06-17 NOTE — Telephone Encounter (Signed)
Call patient and let her know I received notification from her pharmacy that Saxenda is not covered by her drug plan it may be that they exclude all weight loss drugs.  She is welcome to call them to see if there are any that they will cover.  Always consider referral to bariatric clinic for weight loss as well if she is interested.

## 2020-06-18 NOTE — Telephone Encounter (Signed)
LVM letting pt know that the medication is not covered and that she will need to call her insurance company to see what is or we can refer to the bariatric clinic. Advised to rtn call to let us know.

## 2020-06-19 LAB — COMPLETE METABOLIC PANEL WITH GFR
AG Ratio: 1.5 (calc) (ref 1.0–2.5)
ALT: 18 U/L (ref 6–29)
AST: 18 U/L (ref 10–30)
Albumin: 4.2 g/dL (ref 3.6–5.1)
Alkaline phosphatase (APISO): 68 U/L (ref 31–125)
BUN: 16 mg/dL (ref 7–25)
CO2: 28 mmol/L (ref 20–32)
Calcium: 9.7 mg/dL (ref 8.6–10.2)
Chloride: 104 mmol/L (ref 98–110)
Creat: 0.84 mg/dL (ref 0.50–1.10)
GFR, Est African American: 101 mL/min/{1.73_m2} (ref >=60)
GFR, Est Non African American: 87 mL/min/{1.73_m2} (ref >=60)
Globulin: 2.8 g/dL (calc) (ref 1.9–3.7)
Glucose, Bld: 99 mg/dL (ref 65–99)
Potassium: 4.5 mmol/L (ref 3.5–5.3)
Sodium: 139 mmol/L (ref 135–146)
Total Bilirubin: 0.5 mg/dL (ref 0.2–1.2)
Total Protein: 7 g/dL (ref 6.1–8.1)

## 2020-06-19 LAB — LIPID PANEL
Cholesterol: 143 mg/dL (ref <200)
HDL: 45 mg/dL — ABNORMAL LOW (ref >=50)
LDL Cholesterol (Calc): 81 mg/dL (calc)
Non-HDL Cholesterol (Calc): 98 mg/dL (calc) (ref <130)
Total CHOL/HDL Ratio: 3.2 (calc) (ref <5.0)
Triglycerides: 85 mg/dL (ref <150)

## 2020-06-19 LAB — TSH: TSH: 1.34 mIU/L

## 2020-06-19 LAB — CBC
HCT: 40.5 % (ref 35.0–45.0)
Hemoglobin: 13.5 g/dL (ref 11.7–15.5)
MCH: 30.3 pg (ref 27.0–33.0)
MCHC: 33.3 g/dL (ref 32.0–36.0)
MCV: 91 fL (ref 80.0–100.0)
MPV: 10.1 fL (ref 7.5–12.5)
Platelets: 333 10*3/uL (ref 140–400)
RBC: 4.45 10*6/uL (ref 3.80–5.10)
RDW: 12.8 % (ref 11.0–15.0)
WBC: 8.6 10*3/uL (ref 3.8–10.8)

## 2020-06-19 LAB — HEMOGLOBIN A1C
Hgb A1c MFr Bld: 5.5 % of total Hgb (ref <5.7)
Mean Plasma Glucose: 111 (calc)
eAG (mmol/L): 6.2 (calc)

## 2020-07-09 ENCOUNTER — Ambulatory Visit: Payer: 59 | Admitting: Family Medicine

## 2020-07-10 ENCOUNTER — Ambulatory Visit: Payer: 59 | Admitting: Family Medicine

## 2020-08-18 ENCOUNTER — Other Ambulatory Visit: Payer: Self-pay | Admitting: Sports Medicine

## 2020-08-18 DIAGNOSIS — Z30011 Encounter for initial prescription of contraceptive pills: Secondary | ICD-10-CM

## 2020-08-18 NOTE — Telephone Encounter (Signed)
Never prescribed by Dr Connie Carlson. Does she need an appointment?

## 2020-08-19 MED ORDER — LEVONORGEST-ETH ESTRAD 91-DAY 0.15-0.03 &0.01 MG PO TABS: 1.0000 | ORAL_TABLET | Freq: Every day | ORAL | 0 refills | Status: DC

## 2020-08-19 NOTE — Addendum Note (Signed)
Addended by: Towana Badger on: 08/19/2020 10:10 AM   Modules accepted: Orders

## 2020-08-19 NOTE — Telephone Encounter (Signed)
Patient called, states that a 28 day was sent in and per the pharmacy, this medication comes in a 91 tablet pack and cannot be broken.   I have pended RX for 91 day supply. Patient is aware she MUST keep CPE w/PAP scheduled in December.   Please send if you are OK to change this to 91 days

## 2020-08-28 ENCOUNTER — Encounter: Payer: 59 | Admitting: Family Medicine

## 2020-09-16 ENCOUNTER — Encounter: Payer: Self-pay | Admitting: Nurse Practitioner

## 2020-09-16 ENCOUNTER — Telehealth (INDEPENDENT_AMBULATORY_CARE_PROVIDER_SITE_OTHER): Payer: 59 | Admitting: Nurse Practitioner

## 2020-09-16 VITALS — Temp 99.3°F

## 2020-09-16 DIAGNOSIS — U071 COVID-19: Secondary | ICD-10-CM

## 2020-09-16 MED ORDER — HYDROCOD POLST-CPM POLST ER 10-8 MG/5ML PO SUER: 5.0000 mL | Freq: Every evening | ORAL | 0 refills | Status: DC | PRN

## 2020-09-16 NOTE — Progress Notes (Signed)
Virtual Video Visit via MyChart Note  I connected with  Connie Carlson on 09/16/20 at  8:50 AM EST by the video enabled telemedicine application for , MyChart, and verified that I am speaking with the correct person using two identifiers.   I introduced myself as a Publishing rights manager with the practice. We discussed the limitations of evaluation and management by telemedicine and the availability of in person appointments. The patient expressed understanding and agreed to proceed.  Participating parties in this visit include: The patient and nurse practitioner listed  The patient is: at home I am: in the office  Subjective:    CC:  Chief Complaint  Patient presents with  . Covid Positive    BA, feverish, HA, nasal congestion, positive at home test 3 days ago, went for [facility] COVID test 2 days ago but has not heard back regarding results    HPI: Connie Carlson is a 39 y.o. y/o female presenting via MyChart today for COVID positive.  She reports symptoms of body ache and headache started on Saturday. At that time she did a home test, which was positive. She did go for PCR testing on Sunday, but the results have not returned yet. Her son was recently COVID positive and she believes this is where she was exposed.  She reports that the congestion is the most severe symptom, but also endorses headache, body aches, low grade fever, cough, and difficulty sleeping.    She denies shortness of breath or feeling that she can't breath.   She has been using tea with honey and lemon, delsym, ibuprofen, and cold and flu medicine. She reports they are not helping much.   Past medical history, Surgical history, Family history not pertinant except as noted below, Social history, Allergies, and medications have been entered into the medical record, reviewed, and corrections made.   Review of Systems:  All review of systems negative except what is listed in the HPI   Objective:    General: Speaking clearly in  complete sentences without any shortness of breath. She is audibly congested with intermittent cough.   Alert and oriented x3.   Normal judgment.  No apparent acute distress.   Impression and Recommendations:    1. COVID-19 Positive COVID-19 home test. She has been vaccinated with J&J.  Recommend increased rest and hydration.  OTC medication management discussed with patient and provided on AVS.  Warning signs and quarantine recommendations discussed with patient and work note provided.  She is not eligible for mAb infusion at this time due to vaccinated status.  Tussionex provided for severe congestion and cough at bedtime.  Follow-up if symptoms worsen or improve then worsen again or if shortness of breath or any breathing difficulties develop.  - chlorpheniramine-HYDROcodone (TUSSIONEX) 10-8 MG/5ML SUER; Take 5 mLs by mouth at bedtime as needed for cough (cough, will cause drowsiness.).  Dispense: 70 mL; Refill: 0     I discussed the assessment and treatment plan with the patient. The patient was provided an opportunity to ask questions and all were answered. The patient agreed with the plan and demonstrated an understanding of the instructions.   The patient was advised to call back or seek an in-person evaluation if the symptoms worsen or if the condition fails to improve as anticipated.  I provided 20 minutes of non-face-to-face interaction with this MYCHART visit including intake, same-day documentation, and chart review.   Tollie Eth, NP

## 2020-09-16 NOTE — Patient Instructions (Addendum)
Over the counter medications that may be helpful for symptoms: . Guaifenesin 1200 mg extended release tabs twice daily, with plenty of water o For cough and congestion o Brand name: Mucinex   . Pseudoephedrine 30 mg, one or two tabs every 4 to 6 hours o For sinus congestion o Brand name: Sudafed o You must get this from the pharmacy counter.  . Oxymetazoline nasal spray each morning, one spray in each nostril, for NO MORE THAN 3 days  o For nasal and sinus congestion o Brand name: Afrin . Saline nasal spray or Saline Nasal Irrigation 3-5 times a day o For nasal and sinus congestion o Brand names: Ocean or AYR . Fluticasone nasal spray, one spray in each nostril, each morning after oxymetazoline and saline, if used o For nasal and sinus congestion o Brand name: Flonase . Warm salt water gargles  o For sore throat o Every few hours as needed . Alternate ibuprofen 400-600 mg and acetaminophen 1000 mg every 4-6 hours o For fever, body aches, headache o Brand names: Motrin or Advil and Tylenol . Dextromethorphan 12-hour cough version 30 mg every 12 hours  o For cough o Brand name: Delsym Stop all other cold medications for now (Nyquil, Dayquil, Tylenol Cold, Theraflu, etc) and other non-prescription cough/cold preparations. Many of these have the same ingredients listed above and could cause an overdose of medication.   General Instructions . Allow your body to rest . Drink PLENTY of fluids . Isolate yourself from everyone, even family, until test results have returned  If your COVID-19 test is positive . Then you ARE INFECTED and you can pass the virus to others . You must quarantine from others for a minimum of  o 10 days since symptoms started AND o You are fever free for 24 hours WITHOUT any medication to reduce fever AND o Your symptoms are improving . Do not go to the store or other public areas . Do not go around household members who are not known to be infected with  COVID-19 . If you MUST leave you area of quarantine (example: go to a bathroom you share with others in your home), you must o Wear a mask o Wash your hands thoroughly o Wipe down any surfaces you touch . Do not share food, drinks, towels, or other items with other persons . Dispose of your own tissues, food containers, etc  Once you have recovered, please continue good preventive care measures, including:  . wearing a mask when in public . wash your hands frequently . avoid touching your face/nose/eyes . cover coughs/sneezes with the inside of your elbow . stay out of crowds . keep a 6 foot distance from others  Go to the nearest hospital emergency room if fever/cough/breathlessness are severe or illness seems like a threat to life.   Other over the counter medications that have been shown to be helpful include: Quercetin 250-500mg  twice a day Vitamin C 1000mg  daily Vitamin D3 4000 iU daily Zinc 100mg  daily Melatonin 5-10mg  at bedtime  These medications are believed to have properties that help reduce the virus and can be helpful once you have recovered to keep your immune system stronger.

## 2020-10-17 ENCOUNTER — Ambulatory Visit (INDEPENDENT_AMBULATORY_CARE_PROVIDER_SITE_OTHER): Payer: 59 | Admitting: Physician Assistant

## 2020-10-17 ENCOUNTER — Encounter: Payer: Self-pay | Admitting: Physician Assistant

## 2020-10-17 ENCOUNTER — Other Ambulatory Visit (HOSPITAL_COMMUNITY)
Admission: RE | Admit: 2020-10-17 | Discharge: 2020-10-17 | Disposition: A | Discharge status: Home / Self Care | Payer: 59 | Source: Ambulatory Visit | Attending: Physician Assistant | Admitting: Physician Assistant

## 2020-10-17 ENCOUNTER — Other Ambulatory Visit: Payer: Self-pay

## 2020-10-17 VITALS — BP 138/77 | HR 65 | Ht 62.0 in | Wt 217.0 lb

## 2020-10-17 DIAGNOSIS — Z01419 Encounter for gynecological examination (general) (routine) without abnormal findings: Secondary | ICD-10-CM | POA: Diagnosis not present

## 2020-10-17 DIAGNOSIS — R1030 Lower abdominal pain, unspecified: Secondary | ICD-10-CM

## 2020-10-17 DIAGNOSIS — N921 Excessive and frequent menstruation with irregular cycle: Secondary | ICD-10-CM | POA: Insufficient documentation

## 2020-10-17 DIAGNOSIS — R140 Abdominal distension (gaseous): Secondary | ICD-10-CM | POA: Diagnosis not present

## 2020-10-17 LAB — POCT URINALYSIS DIP (CLINITEK)
Bilirubin, UA: NEGATIVE
Glucose, UA: NEGATIVE mg/dL
Ketones, POC UA: NEGATIVE mg/dL
Leukocytes, UA: NEGATIVE
Nitrite, UA: NEGATIVE
POC PROTEIN,UA: NEGATIVE
Spec Grav, UA: 1.025 (ref 1.010–1.025)
Urobilinogen, UA: 0.2 E.U./dL
pH, UA: 6 (ref 5.0–8.0)

## 2020-10-17 MED ORDER — MEDROXYPROGESTERONE ACETATE 10 MG PO TABS: 10.0000 mg | ORAL_TABLET | Freq: Every day | ORAL | 0 refills | Status: DC

## 2020-10-17 NOTE — Patient Instructions (Addendum)
Will get ultrasound   Abnormal Uterine Bleeding Abnormal uterine bleeding means bleeding more than usual from your womb (uterus). It can include:  Bleeding between menstrual periods.  Bleeding after sex.  Bleeding that is heavier than normal.  Menstrual periods that last longer than usual.  Bleeding after you have stopped having your menstrual period (menopause). There are many problems that may cause this. You should see a doctor for any kind of bleeding that is not normal. Treatment depends on the cause of the bleeding. Follow these instructions at home: Medicines  Take over-the-counter and prescription medicines only as told by your doctor.  Tell your doctor about other medicines that you take. ? If told by your doctor, stop taking aspirin or medicines that have aspirin in them. These medicines can make you bleed more.  You may be given iron pills to replace iron that your body loses because of this condition. Take them as told by your doctor. Managing constipation If you are taking iron pills, you may have trouble pooping (constipation). To prevent or treat trouble pooping, you may need to:  Drink enough fluid to keep your pee (urine) pale yellow.  Take over-the-counter or prescription medicines.  Eat foods that are high in fiber. These include beans, whole grains, and fresh fruits and vegetables.  Limit foods that are high in fat and sugar. These include fried or sweet foods. General instructions  Watch your condition for any changes.  Do not use tampons, douche, or have sex, if your doctor tells you not to.  Change your pads often.  Get regular exams. This includes pelvic exams and cervical cancer screenings. ? It is up to you to get the results of any tests that are done. Ask your doctor, or the department that is doing the tests, when your results will be ready.  Keep all follow-up visits as told by your doctor. This is important. Contact a doctor if:  The  bleeding lasts more than 1 week.  You feel dizzy at times.  You feel like you may vomit (nausea).  You vomit.  You feel light-headed or weak.  Your symptoms get worse. Get help right away if:  You pass out.  You have to change pads every hour.  You have pain in your belly.  You have a fever or chills.  You get sweaty.  You get weak.  You pass large blood clots from your vagina. Summary  Abnormal uterine bleeding means bleeding more than usual from your womb (uterus).  Any kind of bleeding that is not normal should be checked by a doctor.  Treatment depends on the cause of the bleeding.  Get help right away if you pass out, you have to change pads every hour, or you pass large blood clots from your vagina. This information is not intended to replace advice given to you by your health care provider. Make sure you discuss any questions you have with your health care provider. Document Revised: 07/10/2019 Document Reviewed: 07/10/2019 Elsevier Patient Education  [place].

## 2020-10-17 NOTE — Progress Notes (Signed)
Subjective:    Patient ID: Connie Carlson, female    DOB: 05/07/1981, 40 y.o.   MRN: [id number]  HPI Patient is a 40 year old obese female with breakthrough bleeding on OCP.  She has not had this in quite some time and been on this birth control for 2 years.  She is having some lower abdominal tenderness but no pain feels more like cramping.  She is having a lot of bloating.  She denies any CVA tenderness or flank pain.  Intercourse has been more painful.  She denies any missed doses of birth control.  She has not had a recent Pap.  She denies any itching or burning.  She did use of boric acid for 2 nights but a week ago unsure if that caused any issues.  .. Active Ambulatory Problems    Diagnosis Date Noted  . Morbid obesity ([place]) 06/28/2006  . Allergic rhinitis, cause unspecified 06/28/2006  . GASTROESOPHAGEAL REFLUX, NO ESOPHAGITIS 06/28/2006  . WEIGHT GAIN 01/01/2009  . AMENORRHEA 11/18/2010  . Migraine headache without aura 02/16/2013  . IFG (impaired fasting glucose) 10/08/2013  . Hypertension 06/11/2020  . BMI 40.0-44.9, adult ([place]) 06/11/2020  . Breakthrough bleeding on birth control pills 10/17/2020   Resolved Ambulatory Problems    Diagnosis Date Noted  . Migraine headache 11/15/2007  . Acute pharyngitis 03/05/2009  . INFLUENZA 09/22/2009  . GASTRITIS, ACUTE 06/28/2006  . HAIR LOSS 12/03/2009  . Dysuria 11/15/2007  . PARESTHESIA 11/18/2010  . Prediabetes 05/06/2015   No Additional Past Medical History       Review of Systems See HPI.     Objective:   Physical Exam Vitals reviewed.  Constitutional:      Appearance: Normal appearance. She is obese.  HENT:     Head: Normocephalic.  Cardiovascular:     Rate and Rhythm: Normal rate and regular rhythm.  Pulmonary:     Effort: Pulmonary effort is normal.     Breath sounds: Normal breath sounds.  Abdominal:     General: Bowel sounds are normal. There is no distension.     Palpations: Abdomen is soft.      Tenderness: There is abdominal tenderness. There is no right CVA tenderness, left CVA tenderness, guarding or rebound.     Comments: Lower abdominal diffuse tenderness.   Genitourinary:    General: Normal vulva.     Comments: Painful tight speculum exam with smallest speculum. Active vaginal bleeding appears like a small polyp at [place] but hard to visualize due to collapsing vaginal walls and using small speculum. No abnormal discharge.  Neurological:     General: No focal deficit present.     Mental Status: She is alert.  Psychiatric:        Mood and Affect: Mood normal.       .. Results for orders placed or performed in visit on 10/17/20  Urine Culture   Specimen: Urine  Result Value Ref Range   MICRO NUMBER: [id number]    SPECIMEN QUALITY: Adequate    Sample Source URINE, CLEAN CATCH    STATUS: FINAL    ISOLATE 1: Escherichia coli (A)       Susceptibility   Escherichia coli - URINE CULTURE, REFLEX    AMOX/CLAVULANIC 4 Sensitive     AMPICILLIN 4 Sensitive     AMPICILLIN/SULBACTAM <=2 Sensitive     CEFAZOLIN* <=4 Not Reportable      * For infections other than uncomplicated UTIcaused by E. coli, K. pneumoniae or P. mirabilis:Cefazolin  is resistant if MIC > or = 8 mcg/mL.(Distinguishing susceptible versus intermediatefor isolates with MIC < or = 4 mcg/mL requiresadditional testing.)For uncomplicated UTI caused by E. coli,K. pneumoniae or P. mirabilis: Cefazolin issusceptible if MIC <32 mcg/mL and predictssusceptible to the oral agents cefaclor, cefdinir,cefpodoxime, cefprozil, cefuroxime, cephalexinand loracarbef.    CEFEPIME <=1 Sensitive     CEFTRIAXONE <=1 Sensitive     CIPROFLOXACIN <=0.25 Sensitive     LEVOFLOXACIN <=0.12 Sensitive     ERTAPENEM <=0.5 Sensitive     GENTAMICIN <=1 Sensitive     IMIPENEM <=0.25 Sensitive     NITROFURANTOIN 32 Sensitive     PIP/TAZO <=4 Sensitive     TOBRAMYCIN <=1 Sensitive     TRIMETH/SULFA* <=20 Sensitive      * For infections other  than uncomplicated UTIcaused by E. coli, K. pneumoniae or P. mirabilis:Cefazolin is resistant if MIC > or = 8 mcg/mL.(Distinguishing susceptible versus intermediatefor isolates with MIC < or = 4 mcg/mL requiresadditional testing.)For uncomplicated UTI caused by E. coli,K. pneumoniae or P. mirabilis: Cefazolin issusceptible if MIC <32 mcg/mL and predictssusceptible to the oral agents cefaclor, cefdinir,cefpodoxime, cefprozil, cefuroxime, cephalexinand loracarbef.Legend:S = Susceptible  I = IntermediateR = Resistant  NS = Not susceptible* = Not tested  NR = Not reported**NN = See antimicrobic comments  TSH  Result Value Ref Range   TSH 0.73 mIU/L  CBC with Differential/Platelet  Result Value Ref Range   WBC 9.0 3.8 - 10.8 Thousand/uL   RBC 4.47 3.80 - 5.10 Million/uL   Hemoglobin 13.6 11.7 - 15.5 g/dL   HCT 40.5 35.0 - 45.0 %   MCV 90.6 80.0 - 100.0 fL   MCH 30.4 27.0 - 33.0 pg   MCHC 33.6 32.0 - 36.0 g/dL   RDW 12.5 11.0 - 15.0 %   Platelets 366 140 - 400 Thousand/uL   MPV 10.7 7.5 - 12.5 fL   Neutro Abs 5,886 1,500 - 7,800 cells/uL   Lymphs Abs 2,592 850 - 3,900 cells/uL   Absolute Monocytes 441 200 - 950 cells/uL   Eosinophils Absolute 54 15 - 500 cells/uL   Basophils Absolute 27 0 - 200 cells/uL   Neutrophils Relative % 65.4 %   Total Lymphocyte 28.8 %   Monocytes Relative 4.9 %   Eosinophils Relative 0.6 %   Basophils Relative 0.3 %  COMPLETE METABOLIC PANEL WITH GFR  Result Value Ref Range   Glucose, Bld 91 65 - 99 mg/dL   BUN 11 7 - 25 mg/dL   Creat 0.73 0.50 - 1.10 mg/dL   GFR, Est Non African American 103 > OR = 60 mL/min/1.31m2   GFR, Est African American 119 > OR = 60 mL/min/1.107m2   BUN/Creatinine Ratio NOT APPLICABLE 6 - 22 (calc)   Sodium 139 135 - 146 mmol/L   Potassium 4.3 3.5 - 5.3 mmol/L   Chloride 105 98 - 110 mmol/L   CO2 25 20 - 32 mmol/L   Calcium 10.0 8.6 - 10.2 mg/dL   Total Protein 7.6 6.1 - 8.1 g/dL   Albumin 4.3 3.6 - 5.1 g/dL   Globulin 3.3 1.9 - 3.7  g/dL (calc)   AG Ratio 1.3 1.0 - 2.5 (calc)   Total Bilirubin 0.4 0.2 - 1.2 mg/dL   Alkaline phosphatase (APISO) 47 31 - 125 U/L   AST 18 10 - 30 U/L   ALT 19 6 - 29 U/L  POCT URINALYSIS DIP (CLINITEK)  Result Value Ref Range   Color, UA yellow yellow   Clarity,  UA clear clear   Glucose, UA negative negative mg/dL   Bilirubin, UA negative negative   Ketones, POC UA negative negative mg/dL   Spec Grav, UA 1.025 1.010 - 1.025   Blood, UA large (A) negative   pH, UA 6.0 5.0 - 8.0   POC PROTEIN,UA negative negative, trace   Urobilinogen, UA 0.2 0.2 or 1.0 E.U./dL   Nitrite, UA Negative Negative   Leukocytes, UA Negative Negative       Assessment & Plan:  .Marland KitchenMehar was seen today for menstrual problem.  Diagnoses and all orders for this visit:  Breakthrough bleeding on birth control pills -     TSH -     CBC with Differential/Platelet -     COMPLETE METABOLIC PANEL WITH GFR -     Cytology - PAP -     POCT URINALYSIS DIP (CLINITEK) -     Urine Culture -     medroxyPROGESTERone (PROVERA) 10 MG tablet; Take 1 tablet (10 mg total) by mouth daily. -     US Pelvic Complete With Transvaginal  Lower abdominal pain -     Cytology - PAP -     POCT URINALYSIS DIP (CLINITEK) -     Urine Culture -     US Pelvic Complete With Transvaginal  Encounter for routine gynecological examination with Papanicolaou smear of cervix -     Cytology - PAP  Bloating -     US Pelvic Complete With Transvaginal   UA large blood.  Ordered culture.  Could be due to break through bleeding.  Unclear actual source of blood. She did have a small polyp but pap exam was very tender and it was hard to get a great view.  Provera given for 5-10 days.  Stay on OCP.  Pap done today with STD screening. Labs done for breakthrough bleeding.  Will order u/s for bloating/bleeding.  Follow up PcP in 2 weeks.

## 2020-10-18 LAB — CBC WITH DIFFERENTIAL/PLATELET
Absolute Monocytes: 441 cells/uL (ref 200–950)
Basophils Absolute: 27 cells/uL (ref 0–200)
Basophils Relative: 0.3 %
Eosinophils Absolute: 54 cells/uL (ref 15–500)
Eosinophils Relative: 0.6 %
HCT: 40.5 % (ref 35.0–45.0)
Hemoglobin: 13.6 g/dL (ref 11.7–15.5)
Lymphs Abs: 2592 cells/uL (ref 850–3900)
MCH: 30.4 pg (ref 27.0–33.0)
MCHC: 33.6 g/dL (ref 32.0–36.0)
MCV: 90.6 fL (ref 80.0–100.0)
MPV: 10.7 fL (ref 7.5–12.5)
Monocytes Relative: 4.9 %
Neutro Abs: 5886 cells/uL (ref 1500–7800)
Neutrophils Relative %: 65.4 %
Platelets: 366 10*3/uL (ref 140–400)
RBC: 4.47 10*6/uL (ref 3.80–5.10)
RDW: 12.5 % (ref 11.0–15.0)
Total Lymphocyte: 28.8 %
WBC: 9 10*3/uL (ref 3.8–10.8)

## 2020-10-18 LAB — COMPLETE METABOLIC PANEL WITH GFR
AG Ratio: 1.3 (calc) (ref 1.0–2.5)
ALT: 19 U/L (ref 6–29)
AST: 18 U/L (ref 10–30)
Albumin: 4.3 g/dL (ref 3.6–5.1)
Alkaline phosphatase (APISO): 47 U/L (ref 31–125)
BUN: 11 mg/dL (ref 7–25)
CO2: 25 mmol/L (ref 20–32)
Calcium: 10 mg/dL (ref 8.6–10.2)
Chloride: 105 mmol/L (ref 98–110)
Creat: 0.73 mg/dL (ref 0.50–1.10)
GFR, Est African American: 119 mL/min/{1.73_m2} (ref >=60)
GFR, Est Non African American: 103 mL/min/{1.73_m2} (ref >=60)
Globulin: 3.3 g/dL (calc) (ref 1.9–3.7)
Glucose, Bld: 91 mg/dL (ref 65–99)
Potassium: 4.3 mmol/L (ref 3.5–5.3)
Sodium: 139 mmol/L (ref 135–146)
Total Bilirubin: 0.4 mg/dL (ref 0.2–1.2)
Total Protein: 7.6 g/dL (ref 6.1–8.1)

## 2020-10-18 LAB — TSH: TSH: 0.73 mIU/L

## 2020-10-18 IMAGING — MG MM DIGITAL DIAGNOSTIC BILAT W/ TOMO W/ CAD
8 series · 8 of 24 positions shown · non-contrast
Comparison: None

CLINICAL DATA: Patient presents for evaluation of diffuse left
breast tenderness which has subsequently resolved.

EXAM:
DIGITAL DIAGNOSTIC BILATERAL MAMMOGRAM WITH CAD AND TOMO

[L MLO synth-2D]
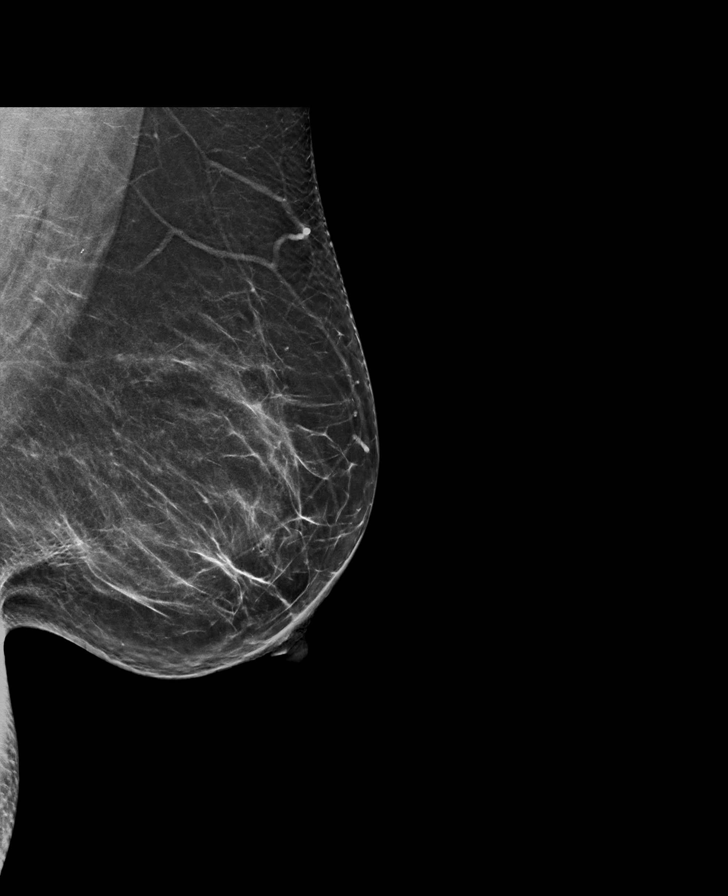

[R CC synth-2D]
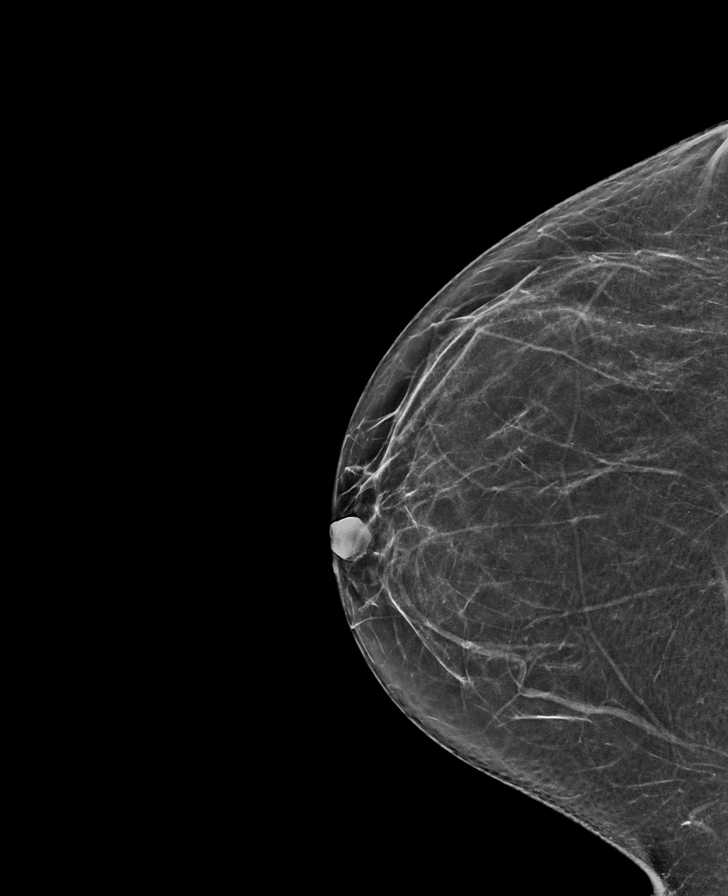

[L CC synth-2D]
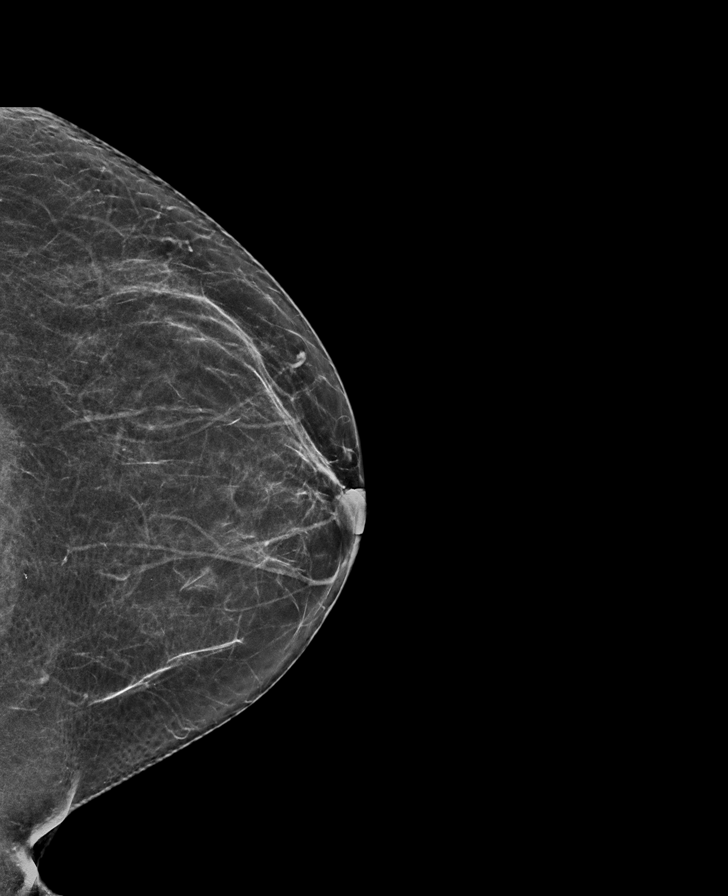

[R MLO synth-2D]
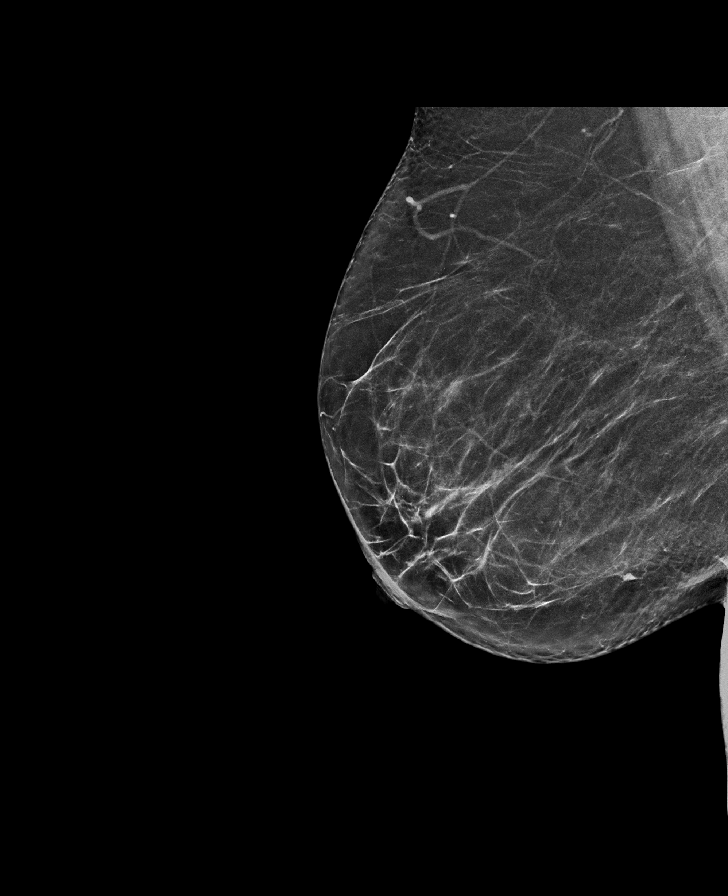

[L CC tomo · tomo slice 33/66.0]
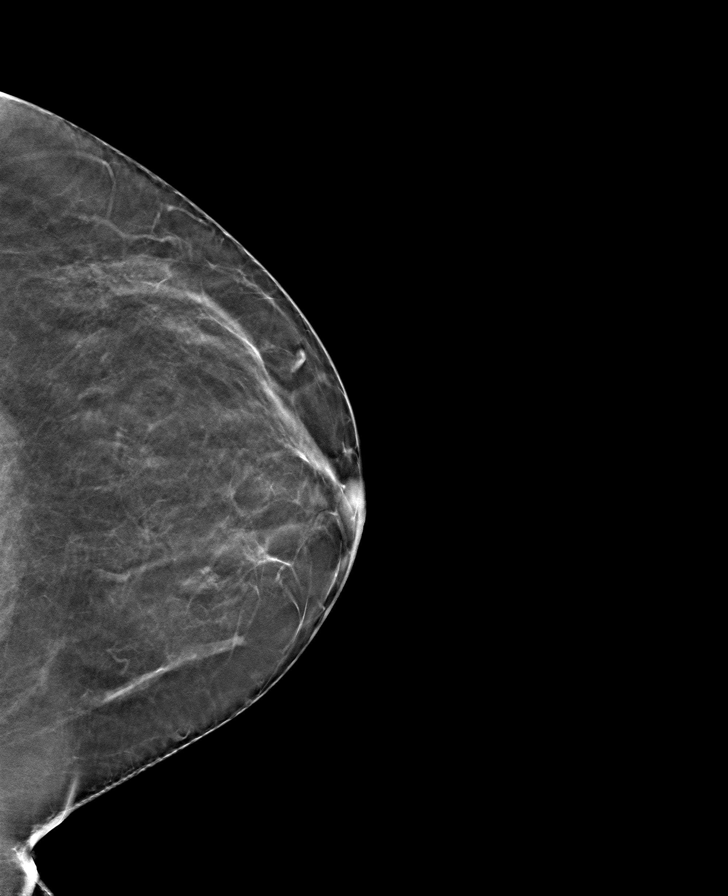

[R CC tomo · tomo slice 35/70.0]
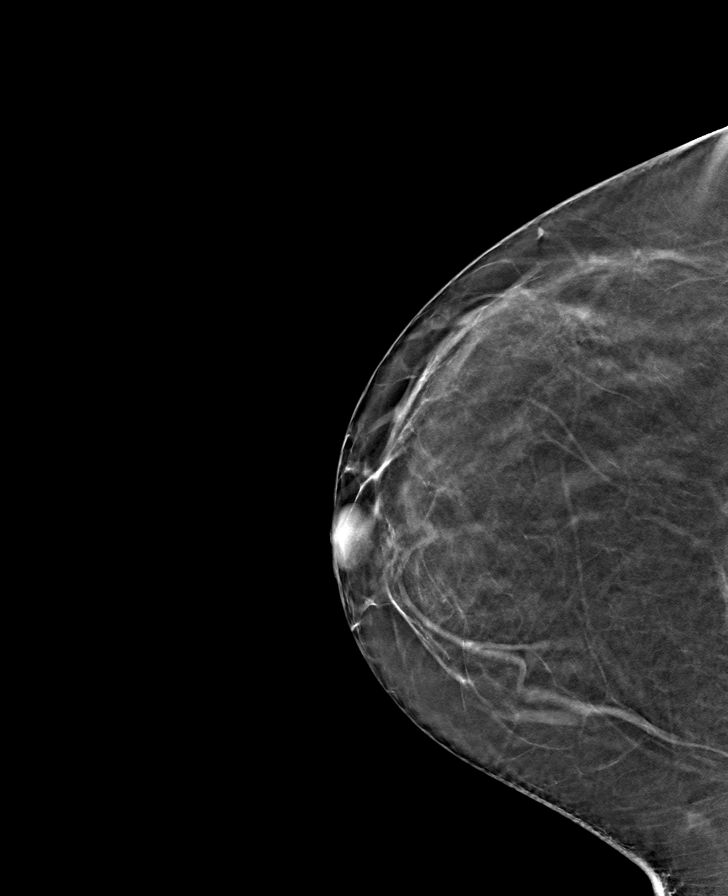

[R MLO tomo · tomo slice 39/77.0]
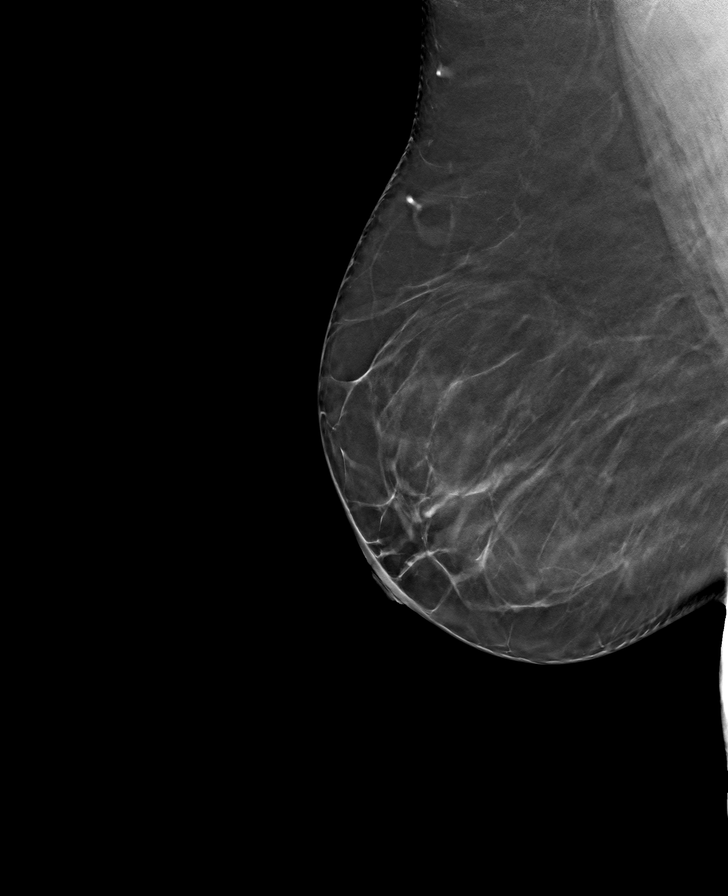

[L MLO tomo · tomo slice 41/80.0]
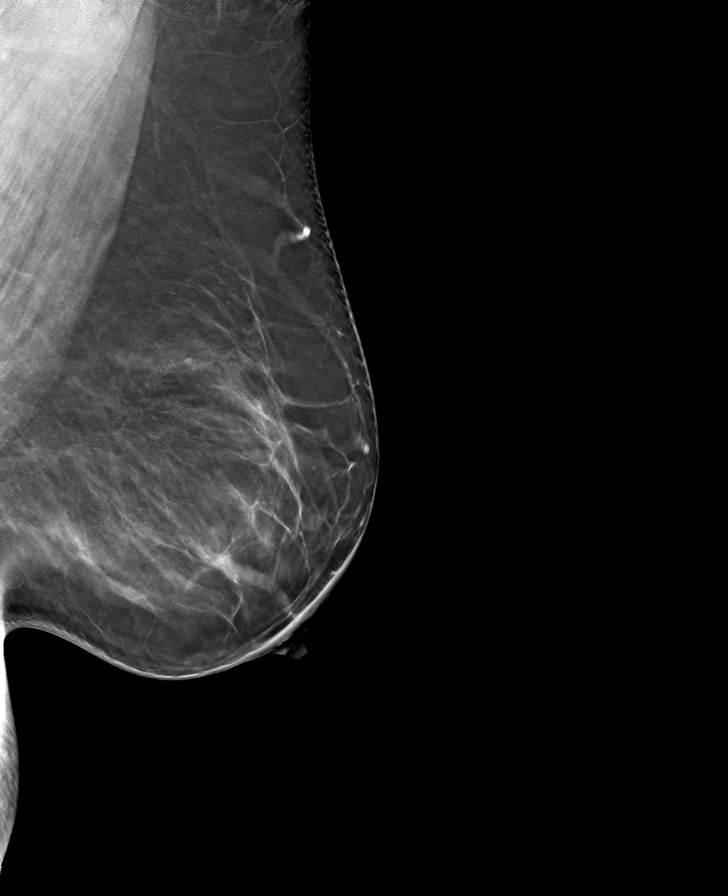

[8 of 24 positions shown; findings below may reference images not displayed]

ACR Breast Density Category b: There are scattered areas of
fibroglandular density.
FINDINGS: No concerning masses, calcifications or distortion identified within
either breast.

Mammographic images were processed with CAD.
IMPRESSION: No mammographic evidence for malignancy.

RECOMMENDATION:
Continued clinical evaluation for left breast pain.

Screening mammogram in one year.(Code:9G-J-13J)

I have discussed the findings and recommendations with the patient.
If applicable, a reminder letter will be sent to the patient
regarding the next appointment.

BI-RADS CATEGORY  1: Negative.

## 2020-10-19 LAB — URINE CULTURE
MICRO NUMBER:: 11472379
SPECIMEN QUALITY:: ADEQUATE

## 2020-10-20 ENCOUNTER — Other Ambulatory Visit: Payer: Self-pay | Admitting: Physician Assistant

## 2020-10-20 MED ORDER — NITROFURANTOIN MONOHYD MACRO 100 MG PO CAPS: 100.0000 mg | ORAL_CAPSULE | Freq: Two times a day (BID) | ORAL | 0 refills | Status: DC

## 2020-10-20 NOTE — Progress Notes (Signed)
Connie Carlson,   You have a UTI. I sent macrobid to pharmacy for 7 days. E.coli was the bacteria.

## 2020-10-21 LAB — CYTOLOGY - PAP
Chlamydia: NEGATIVE
Comment: NEGATIVE
Comment: NEGATIVE
Comment: NORMAL
Diagnosis: NEGATIVE
High risk HPV: NEGATIVE
Neisseria Gonorrhea: NEGATIVE

## 2020-10-21 NOTE — Progress Notes (Signed)
Connie Carlson,   Negative for STD. Negative for HPV. Normal cells. Ok to go 3-5 years.

## 2020-10-29 ENCOUNTER — Telehealth: Payer: Self-pay

## 2020-10-29 ENCOUNTER — Other Ambulatory Visit: Payer: Self-pay

## 2020-10-29 ENCOUNTER — Ambulatory Visit (INDEPENDENT_AMBULATORY_CARE_PROVIDER_SITE_OTHER): Payer: 59

## 2020-10-29 DIAGNOSIS — N921 Excessive and frequent menstruation with irregular cycle: Secondary | ICD-10-CM

## 2020-10-29 DIAGNOSIS — R1030 Lower abdominal pain, unspecified: Secondary | ICD-10-CM | POA: Diagnosis not present

## 2020-10-29 DIAGNOSIS — R140 Abdominal distension (gaseous): Secondary | ICD-10-CM | POA: Diagnosis not present

## 2020-10-29 NOTE — Telephone Encounter (Signed)
Connie Carlson called and left a message. I returned her call and she states she was advised to call back if the vaginal bleeding continued. She did have the ultra sound today.

## 2020-10-30 ENCOUNTER — Other Ambulatory Visit: Payer: Self-pay | Admitting: Physician Assistant

## 2020-10-30 DIAGNOSIS — R9389 Abnormal findings on diagnostic imaging of other specified body structures: Secondary | ICD-10-CM

## 2020-10-30 DIAGNOSIS — N939 Abnormal uterine and vaginal bleeding, unspecified: Secondary | ICD-10-CM

## 2020-10-30 MED ORDER — MEDROXYPROGESTERONE ACETATE 10 MG PO TABS: 10.0000 mg | ORAL_TABLET | Freq: Every day | ORAL | 0 refills | Status: DC

## 2020-10-30 NOTE — Progress Notes (Signed)
Connie Carlson,   Your endometrium lining is thicker it may need to have a biopsy make sure no abnormalities. I am going to send you to GYN for further evaluation.

## 2020-10-30 NOTE — Telephone Encounter (Signed)
Patient made aware of ultrasound results. She is okay with referral and another round of Provera. RX sent to pharmacy.

## 2020-10-30 NOTE — Telephone Encounter (Signed)
See results for ultrasound. We can do another round on progesterone while you wait on appt with GYN. Ok to send if pt agrees. Refill last rx for provera.

## 2020-11-06 ENCOUNTER — Telehealth: Payer: Self-pay | Admitting: Neurology

## 2020-11-06 ENCOUNTER — Other Ambulatory Visit: Payer: Self-pay | Admitting: Family Medicine

## 2020-11-06 DIAGNOSIS — Z30011 Encounter for initial prescription of contraceptive pills: Secondary | ICD-10-CM

## 2020-11-06 NOTE — Telephone Encounter (Signed)
Patient called and states Provera RX has not helped bleeding at all. Still the same. She has one more day left and wondering what to do.  She does not have an appointment with Gynecology yet. She is wondering if there is a different medication she can try. Please advise.  (Saw Luvenia Starch for issue, [place] PCP, routed to both providers)

## 2020-11-07 ENCOUNTER — Other Ambulatory Visit: Payer: Self-pay | Admitting: Physician Assistant

## 2020-11-07 DIAGNOSIS — N921 Excessive and frequent menstruation with irregular cycle: Secondary | ICD-10-CM

## 2020-11-07 MED ORDER — MEDROXYPROGESTERONE ACETATE 10 MG PO TABS: 10.0000 mg | ORAL_TABLET | Freq: Two times a day (BID) | ORAL | 0 refills | Status: DC

## 2020-11-07 NOTE — Progress Notes (Signed)
p 

## 2020-11-07 NOTE — Telephone Encounter (Signed)
Increase to provera twice a day until see GYN. I sent new rx to pharmacy.

## 2020-11-07 NOTE — Telephone Encounter (Signed)
LMOM letting patient know. To call back with any questions.

## 2020-12-01 ENCOUNTER — Encounter: Payer: Self-pay | Admitting: Obstetrics & Gynecology

## 2020-12-01 ENCOUNTER — Ambulatory Visit: Payer: 59 | Admitting: Obstetrics & Gynecology

## 2020-12-01 ENCOUNTER — Other Ambulatory Visit: Payer: Self-pay

## 2020-12-01 VITALS — BP 116/72 | HR 60 | Ht 62.0 in | Wt 213.0 lb

## 2020-12-01 DIAGNOSIS — N921 Excessive and frequent menstruation with irregular cycle: Secondary | ICD-10-CM | POA: Diagnosis not present

## 2020-12-01 NOTE — Progress Notes (Signed)
Pt began bleeding in Jan 2022 and didn't stop until 2 weeks ago when she took Provera

## 2020-12-03 ENCOUNTER — Telehealth: Payer: Self-pay | Admitting: *Deleted

## 2020-12-03 NOTE — Telephone Encounter (Signed)
Left patient a message with appointment day and time information. Patient advised to call the office if she is still bleeding closer to the appointment date, appointment will be changed at that time for in office biopsy per Dr. Gala Romney.

## 2020-12-04 NOTE — Progress Notes (Signed)
Subjective:    Patient ID: Connie Carlson, female    DOB: [birth year], 40 y.o.   MRN: [id number]  HPI  39 yo female presents for evaluation of menometrorrhagia.  She c/o bleeding since January and was recently  placed on provera which stopped the bleeding.  She is also taking a combination OCP (Simpesse) and sometimes missed doses of her OCPs     TVUS completed on Oct 29, 2020  EXAM: TRANSABDOMINAL AND TRANSVAGINAL ULTRASOUND OF PELVIS  TECHNIQUE: Both transabdominal and transvaginal ultrasound examinations of the pelvis were performed. Transabdominal technique was performed for global imaging of the pelvis including uterus, ovaries, adnexal regions, and pelvic cul-de-sac. It was necessary to proceed with endovaginal exam following the transabdominal exam to visualize the uterus, endometrium, and ovaries.  COMPARISON:  None  FINDINGS: Uterus  Measurements: 8.0 x 3.9 x 5.3 cm = volume: 87 mL. Uterus is anteverted. No discrete fibroid. Diffuse heterogeneous echotexture with thickening and somewhat edematous appearance of the cervix is seen. No associated vascularity or definite discrete mass.  Endometrium  Thickness: 6.5 mm. No focal abnormality visualized. Trace simple anechoic fluid noted within the endocervical canal.  Right ovary  Measurements: 1.8 x 1.1 x 1.3 cm = volume: 1.0 mL. Normal appearance/no adnexal mass.  Left ovary  Measurements: 2.1 x 1.1 x 1.2 cm = volume: 2.0 mL. Normal appearance/no adnexal mass.  Other findings  No abnormal free fluid.  IMPRESSION: 1. Diffusely thickened and heterogeneous appearance of the cervix, of uncertain significance. Correlation with physical exam and Pap smear recommended. No discrete mass or associated vascularity evident by sonography. 2. Endometrial stripe measures 6.5 mm in thickness. If bleeding remains unresponsive to hormonal or medical therapy, sonohysterogram should be considered for focal lesion  work-up. (Ref: Radiological Reasoning: Algorithmic Workup of Abnormal Vaginal Bleeding with Endovaginal Sonography and Sonohysterography. AJR 2008; [id number]:[id number]) 3. Otherwise normal sonographic appearance of the uterus and ovaries. No adnexal mass or free fluid.  Review of Systems  Constitutional: Negative.   Respiratory: Negative.   Cardiovascular: Negative.   Gastrointestinal: Negative.   Genitourinary: Positive for menstrual problem and vaginal bleeding. Negative for dyspareunia and pelvic pain.  Musculoskeletal: Negative.   Psychiatric/Behavioral: Negative.        Objective:   Physical Exam Vitals reviewed.  Constitutional:      General: She is not in acute distress.    Appearance: She is well-developed.  HENT:     Head: Normocephalic and atraumatic.  Eyes:     Conjunctiva/sclera: Conjunctivae normal.  Cardiovascular:     Rate and Rhythm: Normal rate.  Pulmonary:     Effort: Pulmonary effort is normal.  Abdominal:     General: Abdomen is flat. There is no distension.     Palpations: Abdomen is soft.     Tenderness: There is no abdominal tenderness.  Genitourinary:    Comments: [place] V Vulva:  No lesion Vagina:  Pink, no lesions, no discharge, no blood Cervix:  No CMT, +benign nabothian cysts Uterus:  Non tender, mobile, limited by habitus Right adnexa--non tender, no mass, limited by habitus Left adnexa--non tender, no mass, limited by habitus  Skin:    General: Skin is warm and dry.  Neurological:     Mental Status: She is alert and oriented to person, place, and time.  Psychiatric:        Mood and Affect: Mood normal.        Behavior: Behavior normal.    Vitals:   12/01/20 1352  BP:  116/72  Pulse: 60  Weight: 213 lb (96.6 kg)  Height: 5\' 2"  (1.575 m)      Assessment & Plan:  40 yo female with menometrorrhagia on OCPs  1.  Cervix appears normal.  Pap and cultures negative on Oct 17, 2020 2.  Menstrual calendar.  Showed patient how to use the apple  health app to track bleeding.  3.  Appt is 8 weeks to review menstrual calendar.  If patient is still bleeding, will change this to in person and consider biopsy.    35 minutes spent with patient during history and exam, review of records, counseling and documentation.

## 2021-01-01 ENCOUNTER — Telehealth (INDEPENDENT_AMBULATORY_CARE_PROVIDER_SITE_OTHER): Payer: Self-pay | Admitting: Family Medicine

## 2021-01-01 DIAGNOSIS — Z5329 Procedure and treatment not carried out because of patient's decision for other reasons: Secondary | ICD-10-CM

## 2021-01-01 DIAGNOSIS — Z91199 Patient's noncompliance with other medical treatment and regimen due to unspecified reason: Secondary | ICD-10-CM

## 2021-01-01 NOTE — Progress Notes (Signed)
No Show Sent patient 2 video visit links via text, and called her telephone twice with no answer.

## 2021-01-16 ENCOUNTER — Telehealth: Payer: Self-pay | Admitting: *Deleted

## 2021-01-16 NOTE — Telephone Encounter (Signed)
LVM advising pt of form completion and $29.00 fee for this that will need to be paid before these are released.   Gave option of calling to pay over the phone or come by to pay and pick up .

## 2021-01-26 ENCOUNTER — Encounter: Payer: 59 | Admitting: Obstetrics & Gynecology

## 2021-01-26 ENCOUNTER — Telehealth: Payer: Self-pay

## 2021-01-26 NOTE — Telephone Encounter (Signed)
Attempted to call pt to begin virtual visit. Pt did not answer. VM left asking pt to return call to office to start virtual visit

## 2021-02-01 NOTE — Progress Notes (Signed)
This encounter was created in error - please disregard.

## 2021-02-02 ENCOUNTER — Other Ambulatory Visit: Payer: Self-pay | Admitting: Family Medicine

## 2021-02-02 DIAGNOSIS — Z30011 Encounter for initial prescription of contraceptive pills: Secondary | ICD-10-CM

## 2021-02-02 MED ORDER — LEVONORGEST-ETH ESTRAD 91-DAY 0.15-0.03 &0.01 MG PO TABS: 1.0000 | ORAL_TABLET | Freq: Every day | ORAL | 1 refills | Status: DC

## 2021-04-07 ENCOUNTER — Other Ambulatory Visit: Payer: Self-pay

## 2021-04-07 ENCOUNTER — Ambulatory Visit: Payer: 59 | Admitting: Family Medicine

## 2021-04-07 ENCOUNTER — Encounter: Payer: Self-pay | Admitting: Family Medicine

## 2021-04-07 VITALS — BP 119/66 | HR 74 | Ht 62.0 in | Wt 221.0 lb

## 2021-04-07 DIAGNOSIS — G43009 Migraine without aura, not intractable, without status migrainosus: Secondary | ICD-10-CM | POA: Diagnosis not present

## 2021-04-07 DIAGNOSIS — F339 Major depressive disorder, recurrent, unspecified: Secondary | ICD-10-CM | POA: Diagnosis not present

## 2021-04-07 DIAGNOSIS — R197 Diarrhea, unspecified: Secondary | ICD-10-CM

## 2021-04-07 DIAGNOSIS — I10 Essential (primary) hypertension: Secondary | ICD-10-CM

## 2021-04-07 DIAGNOSIS — R109 Unspecified abdominal pain: Secondary | ICD-10-CM

## 2021-04-07 DIAGNOSIS — Z30011 Encounter for initial prescription of contraceptive pills: Secondary | ICD-10-CM

## 2021-04-07 MED ORDER — RIZATRIPTAN BENZOATE 5 MG PO TABS: 5.0000 mg | ORAL_TABLET | ORAL | 5 refills | Status: DC | PRN

## 2021-04-07 MED ORDER — LEVONORGEST-ETH ESTRAD 91-DAY 0.15-0.03 &0.01 MG PO TABS: 1.0000 | ORAL_TABLET | Freq: Every day | ORAL | 2 refills | Status: DC

## 2021-04-07 MED ORDER — VALSARTAN-HYDROCHLOROTHIAZIDE 80-12.5 MG PO TABS: 1.0000 | ORAL_TABLET | Freq: Every day | ORAL | 1 refills | Status: DC

## 2021-04-07 NOTE — Assessment & Plan Note (Addendum)
Well controlled. Though she has a dry cough that started several weeks ago I think it could be from her ACE inhibitor it is very possible we also discussed that it could be related to GERD which she does have a history of but does not feel like she is currently had any active symptoms.

## 2021-04-07 NOTE — Assessment & Plan Note (Signed)
She has had some ramp-up in headaches because of poor sleep and increased stress we will work on trying to get the sleep quality better and see if that helps.

## 2021-04-07 NOTE — Assessment & Plan Note (Signed)
Depression with anxiety.  Discussed options again she is quite adamant about not taking medication at this point but she is open to working with a therapist again.  She feels like it was helpful previously.  She is having a lot of worry particularly around her son right now.

## 2021-04-07 NOTE — Progress Notes (Signed)
Established Patient Office Visit  Subjective:  Patient ID: Connie Carlson, female    DOB: 11/16/1980  Age: 40 y.o. MRN: [id number]  CC: No chief complaint on file.   HPI Connie Carlson presents for   F/U Migraines  -she has had a recent bump up in her migraines which she feels is directly related to not sleeping well which she feels is related to increased stress levels.  She has always struggled a little with anxiety and excessive worry but feels like lately she is also having more depressive symptoms some days it is hard to get motivated and get out of bed and go to work.  But she really is adamant about not wanting to take medication.  She was seeing a therapist for a while but then her job hours changed and she was not able to follow-up regularly.  She has started walking for exercise recently to try to improve her mood.  Hypertension- Pt denies chest pain, SOB, dizziness, or heart palpitations.  Taking meds as directed w/o problems.  Denies medication side effects.  Has been walking regularly for exercise.    She reports that she has been having stomachaches on and off for about 1-1/2 months.  She says that she will eat something in her stomach will start to hurt and then she will have diarrhea.  She has not noted any specific foods that are triggering it she says she constantly feels nauseated even wakes up feeling nauseated.  She denies any blood in her stool.  No known sick contacts.  She says she is even tested for COVID several times.  No recent reflux or heartburn or belching.  Is also noticed a dry cough for the last several weeks it seems to be most bothersome particularly at night and sometimes if she is talking a lot at work again she has not been sick or had any other upper respiratory symptoms recently.  No recent chest pain or shortness of breath.  History reviewed. No pertinent past medical history.  Past Surgical History:  Procedure Laterality Date   CHOLECYSTECTOMY     mole  remove     under right breast by [facility] - skin cancer    Family History  Problem Relation Age of Onset   Diabetes Other     Social History   Socioeconomic History   Marital status: Married    Spouse name: Not on file   Number of children: 1   Years of education: Not on file   Highest education level: Not on file  Occupational History   Not on file  Tobacco Use   Smoking status: Never   Smokeless tobacco: Never  Vaping Use   Vaping Use: Never used  Substance and Sexual Activity   Alcohol use: No   Drug use: No   Sexual activity: Yes    Partners: Male    Birth control/protection: Pill  Other Topics Concern   Not on file  Social History Narrative   Not on file   Social Determinants of Health   Financial Resource Strain: Not on file  Food Insecurity: Not on file  Transportation Needs: Not on file  Physical Activity: Not on file  Stress: Not on file  Social Connections: Not on file  Intimate Partner Violence: Not on file    Outpatient Medications Prior to Visit  Medication Sig Dispense Refill   APPLE CIDER VINEGAR PO Take by mouth.     BIOTIN PO Take by mouth.  COLLAGEN PO Take by mouth.     medroxyPROGESTERone (PROVERA) 10 MG tablet Take 1 tablet (10 mg total) by mouth in the morning and at bedtime. 60 tablet 0   TURMERIC PO Take by mouth.     VITAMIN E PO Take by mouth.     Levonorgestrel-Ethinyl Estradiol (SIMPESSE) 0.15-0.03 &0.01 MG tablet Take 1 tablet by mouth daily. 91 tablet 1   lisinopril-hydrochlorothiazide (ZESTORETIC) 10-12.5 MG tablet TAKE ONE TABLET BY MOUTH IN THE MORNING 90 tablet 0   nitrofurantoin, macrocrystal-monohydrate, (MACROBID) 100 MG capsule Take 1 capsule (100 mg total) by mouth 2 (two) times daily. 14 capsule 0   rizatriptan (MAXALT) 5 MG tablet Take 1 tablet (5 mg total) by mouth as needed for migraine. May repeat in 2 hours if needed 10 tablet 5   vitamin B-12 (CYANOCOBALAMIN) 500 MCG tablet Take 500 mcg by mouth  daily.     ondansetron (ZOFRAN-ODT) 8 MG disintegrating tablet Take 1 tablet (8 mg total) by mouth every 8 (eight) hours as needed for nausea. 20 tablet 3   No facility-administered medications prior to visit.    Allergies  Allergen Reactions   Topiramate Other (See Comments)    Leg cramps    ROS Review of Systems    Objective:    Physical Exam Constitutional:      Appearance: Normal appearance. She is well-developed.  HENT:     Head: Normocephalic and atraumatic.  Cardiovascular:     Rate and Rhythm: Normal rate and regular rhythm.     Heart sounds: Normal heart sounds.  Pulmonary:     Effort: Pulmonary effort is normal.     Breath sounds: Normal breath sounds.  Skin:    General: Skin is warm and dry.  Neurological:     Mental Status: She is alert and oriented to person, place, and time.  Psychiatric:        Behavior: Behavior normal.    BP 119/66   Pulse 74   Ht 5\' 2"  (1.575 m)   Wt 221 lb 0.6 oz (100.3 kg)   SpO2 98%   BMI 40.43 kg/m  Wt Readings from Last 3 Encounters:  04/07/21 221 lb 0.6 oz (100.3 kg)  12/01/20 213 lb (96.6 kg)  10/17/20 217 lb (98.4 kg)     Health Maintenance Due  Topic Date Due   COVID-19 Vaccine (2 - Booster for Janssen series) 02/14/2020    There are no preventive care reminders to display for this patient.  Lab Results  Component Value Date   TSH 0.73 10/17/2020   Lab Results  Component Value Date   WBC 9.0 10/17/2020   HGB 13.6 10/17/2020   HCT 40.5 10/17/2020   MCV 90.6 10/17/2020   PLT 366 10/17/2020   Lab Results  Component Value Date   NA 139 10/17/2020   K 4.3 10/17/2020   CO2 25 10/17/2020   GLUCOSE 91 10/17/2020   BUN 11 10/17/2020   CREATININE 0.73 10/17/2020   BILITOT 0.4 10/17/2020   ALKPHOS 69 05/06/2015   AST 18 10/17/2020   ALT 19 10/17/2020   PROT 7.6 10/17/2020   ALBUMIN 4.1 05/06/2015   CALCIUM 10.0 10/17/2020   Lab Results  Component Value Date   CHOL 143 06/18/2020   Lab Results   Component Value Date   HDL 45 (L) 06/18/2020   Lab Results  Component Value Date   LDLCALC 81 06/18/2020   Lab Results  Component Value Date   TRIG 85 06/18/2020  Lab Results  Component Value Date   CHOLHDL 3.2 06/18/2020   Lab Results  Component Value Date   HGBA1C 5.5 06/18/2020      Assessment & Plan:   Problem List Items Addressed This Visit       Cardiovascular and Mediastinum   Migraine headache without aura    She has had some ramp-up in headaches because of poor sleep and increased stress we will work on trying to get the sleep quality better and see if that helps.       Relevant Medications   rizatriptan (MAXALT) 5 MG tablet   valsartan-hydrochlorothiazide (DIOVAN-HCT) 80-12.5 MG tablet   Hypertension    Well controlled. Though she has a dry cough that started several weeks ago I think it could be from her ACE inhibitor it is very possible we also discussed that it could be related to GERD which she does have a history of but does not feel like she is currently had any active symptoms.       Relevant Medications   valsartan-hydrochlorothiazide (DIOVAN-HCT) 80-12.5 MG tablet     Other   Depression, recurrent ([place]) - Primary    Depression with anxiety.  Discussed options again she is quite adamant about not taking medication at this point but she is open to working with a therapist again.  She feels like it was helpful previously.  She is having a lot of worry particularly around her son right now.       Relevant Orders   Ambulatory referral to [facility]   Other Visit Diagnoses     Encounter for initial prescription of contraceptive pills       Relevant Medications   Levonorgestrel-Ethinyl Estradiol (SIMPESSE) 0.15-0.03 &0.01 MG tablet   Diarrhea, unspecified type       Relevant Orders   CBC   COMPLETE METABOLIC PANEL WITH GFR   Lipase   Gastrointestinal Pathogen Panel PCR   Abdominal pain, unspecified abdominal location       Relevant  Orders   CBC   COMPLETE METABOLIC PANEL WITH GFR   Lipase   Gastrointestinal Pathogen Panel PCR       Abdominal pain with diarrhea-unclear etiology again its been going on for about 6 weeks we will start with checking liver enzymes, pancreatic function and electrolytes.  We will also do a stool culture for further work-up.  Also consider could be related to poor sleep quality and increased stress.  She is also happy with her birth control and would like a refill.  Meds ordered this encounter  Medications   Levonorgestrel-Ethinyl Estradiol (SIMPESSE) 0.15-0.03 &0.01 MG tablet    Sig: Take 1 tablet by mouth daily.    Dispense:  91 tablet    Refill:  2   rizatriptan (MAXALT) 5 MG tablet    Sig: Take 1 tablet (5 mg total) by mouth as needed for migraine. May repeat in 2 hours if needed    Dispense:  10 tablet    Refill:  5   valsartan-hydrochlorothiazide (DIOVAN-HCT) 80-12.5 MG tablet    Sig: Take 1 tablet by mouth daily.    Dispense:  90 tablet    Refill:  1    Follow-up: Return in about 6 weeks (around 05/19/2021) for Hypertension/Mood.    Beatrice Lecher, MD

## 2021-04-09 ENCOUNTER — Telehealth: Payer: Self-pay | Admitting: *Deleted

## 2021-04-09 NOTE — Telephone Encounter (Signed)
Pt called and stated that she was seen on Tuesday and she is experiencing more diarrhea, and now has a productive cough. She denies any f/s/c/n/v. She has taken 2 COVID tests and they were both negative.   She would like something sent to her pharmacy and also asked if she can get a note for work for yesterday and today.   Will fwd to pcp for advice.

## 2021-04-09 NOTE — Telephone Encounter (Signed)
Spoke w/pt and advised her that she must go have labwork done so that Dr. Madilyn Fireman can know what is going on with her. Pt states that she is going today to get this done. Letter written and placed up front for p/u.

## 2021-04-10 LAB — COMPLETE METABOLIC PANEL WITH GFR
AG Ratio: 1.4 (calc) (ref 1.0–2.5)
ALT: 13 U/L (ref 6–29)
AST: 13 U/L (ref 10–30)
Albumin: 4 g/dL (ref 3.6–5.1)
Alkaline phosphatase (APISO): 49 U/L (ref 31–125)
BUN: 11 mg/dL (ref 7–25)
CO2: 26 mmol/L (ref 20–32)
Calcium: 9.4 mg/dL (ref 8.6–10.2)
Chloride: 106 mmol/L (ref 98–110)
Creat: 0.72 mg/dL (ref 0.50–0.99)
Globulin: 2.9 g/dL (calc) (ref 1.9–3.7)
Glucose, Bld: 102 mg/dL (ref 65–139)
Potassium: 4.1 mmol/L (ref 3.5–5.3)
Sodium: 138 mmol/L (ref 135–146)
Total Bilirubin: 0.3 mg/dL (ref 0.2–1.2)
Total Protein: 6.9 g/dL (ref 6.1–8.1)
eGFR: 108 mL/min/{1.73_m2} (ref >=60)

## 2021-04-10 LAB — LIPASE: Lipase: 26 U/L (ref 7–60)

## 2021-04-10 LAB — CBC
HCT: 40.6 % (ref 35.0–45.0)
Hemoglobin: 13.1 g/dL (ref 11.7–15.5)
MCH: 29.5 pg (ref 27.0–33.0)
MCHC: 32.3 g/dL (ref 32.0–36.0)
MCV: 91.4 fL (ref 80.0–100.0)
MPV: 9.8 fL (ref 7.5–12.5)
Platelets: 340 10*3/uL (ref 140–400)
RBC: 4.44 10*6/uL (ref 3.80–5.10)
RDW: 12.6 % (ref 11.0–15.0)
WBC: 8.4 10*3/uL (ref 3.8–10.8)

## 2021-06-01 ENCOUNTER — Telehealth: Payer: Self-pay | Admitting: *Deleted

## 2021-06-01 NOTE — Telephone Encounter (Signed)
Pt called and stated that she recently found out that her Ex-husband was positive for HPV. She wanted to know if she had been tested for this when she got her pap done.   Pt was advised that we do test for this on her Pap and it was negative when her pap was done in January 2022 and August 2016.   Pt advised that No further testing is needed.

## 2021-06-16 ENCOUNTER — Telehealth: Payer: Self-pay | Admitting: Family Medicine

## 2021-06-16 NOTE — Telephone Encounter (Signed)
Forms completed for FMLA.  Fax received 06/16/21 Placed in [place] B box - please attach charge sheet.

## 2021-06-17 NOTE — Telephone Encounter (Signed)
Forms placed up front for pt to be contacted for fees/fax.

## 2021-06-17 NOTE — Telephone Encounter (Signed)
Left voicemail message letting patient know paperwork is up front and ready to be picked up and that there is a 29$ fee. Am

## 2021-09-30 ENCOUNTER — Other Ambulatory Visit: Payer: Self-pay | Admitting: Family Medicine

## 2021-09-30 DIAGNOSIS — I10 Essential (primary) hypertension: Secondary | ICD-10-CM

## 2021-09-30 DIAGNOSIS — Z30011 Encounter for initial prescription of contraceptive pills: Secondary | ICD-10-CM

## 2021-10-23 ENCOUNTER — Ambulatory Visit: Payer: 59 | Admitting: Sports Medicine

## 2021-10-23 ENCOUNTER — Other Ambulatory Visit: Payer: Self-pay

## 2021-10-23 DIAGNOSIS — J329 Chronic sinusitis, unspecified: Secondary | ICD-10-CM

## 2021-10-23 DIAGNOSIS — J40 Bronchitis, not specified as acute or chronic: Secondary | ICD-10-CM | POA: Diagnosis not present

## 2021-10-23 MED ORDER — VICKS DAYQUIL/NYQUIL SEVERE PO TBPK: ORAL_TABLET | ORAL | Status: DC

## 2021-10-23 MED ORDER — AZITHROMYCIN 250 MG PO TABS: ORAL_TABLET | ORAL | 0 refills | Status: DC

## 2021-10-23 NOTE — Assessment & Plan Note (Addendum)
This is a very pleasant 41 year old female, she has had 3 days of headaches, coughing, facial pain and pressure with nasal discharge. She did have a negative COVID test. Malaise and fatigue, no overt fevers. On exam she has tenderness over her maxillary sinuses. Lungs are clear, adding azithromycin, she will do over-the-counter DayQuil and NyQuil, out of work until Wednesday.

## 2021-10-23 NOTE — Progress Notes (Signed)
° ° °  Procedures performed today:    None.  Independent interpretation of notes and tests performed by another provider:   None.  Brief History, Exam, Impression, and Recommendations:    Sinobronchitis This is a very pleasant 41 year old female, she has had 3 days of headaches, coughing, facial pain and pressure with nasal discharge. She did have a negative COVID test. Malaise and fatigue, no overt fevers. On exam she has tenderness over her maxillary sinuses. Lungs are clear, adding azithromycin, she will do over-the-counter DayQuil and NyQuil, out of work until Wednesday.    ___________________________________________ Gwen Her. Dianah Field, M.D., ABFM., CAQSM. Primary Care and [place] Instructor of [employer] [facility] [employer]

## 2021-11-03 ENCOUNTER — Ambulatory Visit: Payer: 59 | Admitting: Family Medicine

## 2022-01-13 ENCOUNTER — Encounter: Payer: Self-pay | Admitting: Family Medicine

## 2022-01-13 ENCOUNTER — Ambulatory Visit: Payer: 59 | Admitting: Family Medicine

## 2022-01-13 DIAGNOSIS — K602 Anal fissure, unspecified: Secondary | ICD-10-CM | POA: Diagnosis not present

## 2022-01-13 MED ORDER — NIFEDIPINE POWD: 0 refills | Status: DC

## 2022-01-13 NOTE — Progress Notes (Signed)
?[name] - 41 y.o. female MRN [id number]  Date of birth: [birth year] ? ?Subjective ?No chief complaint on file. ? ? ?HPI ?Connie Carlson is a 41 y.o. female here today with complaint of rectal pain and bleeding.  She reports that symptoms started 3 days ago.  Bowel movement have been hard since returning from [place] last week.  She admits that she did not hydrate well during her trip.  She has tried OTC cream without much relief.  ? ?ROS:  A comprehensive ROS was completed and negative except as noted per HPI ? ?Allergies  ?Allergen Reactions  ? Topiramate Other (See Comments)  ?  Leg cramps  ? ? ?No past medical history on file. ? ?Past Surgical History:  ?Procedure Laterality Date  ? CHOLECYSTECTOMY    ? mole remove    ? under right breast by [facility] - skin cancer  ? ? ?Social History  ? ?Socioeconomic History  ? Marital status: Married  ?  Spouse name: Not on file  ? Number of children: 1  ? Years of education: Not on file  ? Highest education level: Not on file  ?Occupational History  ? Not on file  ?Tobacco Use  ? Smoking status: Never  ? Smokeless tobacco: Never  ?Vaping Use  ? Vaping Use: Never used  ?Substance and Sexual Activity  ? Alcohol use: No  ? Drug use: No  ? Sexual activity: Yes  ?  Partners: Male  ?  Birth control/protection: Pill  ?Other Topics Concern  ? Not on file  ?Social History Narrative  ? Not on file  ? ?Social Determinants of Health  ? ?Financial Resource Strain: Not on file  ?Food Insecurity: Not on file  ?Transportation Needs: Not on file  ?Physical Activity: Not on file  ?Stress: Not on file  ?Social Connections: Not on file  ? ? ?Family History  ?Problem Relation Age of Onset  ? Diabetes Other   ? ? ?Health Maintenance  ?Topic Date Due  ? COVID-19 Vaccine (2 - Booster for Janssen series) 02/14/2020  ? INFLUENZA VACCINE  04/20/2022  ? TETANUS/TDAP  06/08/2022  ? PAP SMEAR-Modifier  10/17/2025  ? Hepatitis C Screening  Completed  ? HIV Screening  Completed  ?  Pneumococcal Vaccine 58-21 Years old  Aged Out  ? HPV VACCINES  Aged Out  ? ? ? ?----------------------------------------------------------------------------------------------------------------------------------------------------------------------------------------------------------------- ?Physical Exam ?BP 137/74 (BP Location: Left Arm, Patient Position: Sitting, Cuff Size: Large)   Pulse 73   Ht '5\' 2"'$  (1.575 m)   Wt 228 lb (103.4 kg)   SpO2 98%   BMI 41.70 kg/m?  ? ?Physical Exam ?Constitutional:   ?   Appearance: Normal appearance.  ?Genitourinary: ?   Comments: Tender area that appears to be a fissure, ~6 o'clock position along the anus.  No thrombosed hemorrhoids or active bleeding noted.  ? ?Chaperone: Leontine Locket, CMA  ?Musculoskeletal:  ?   Cervical back: Neck supple.  ?Neurological:  ?   Mental Status: She is alert.  ? ? ?------------------------------------------------------------------------------------------------------------------------------------------------------------------------------------------------------------------- ?Assessment and Plan ? ?Anal fissure ?Area appears to be a fissure rather than hemorrhoid.  Recommend increased fluid and fiber intake.  Instructed on use of Sitz bath.  Adding compounded nifedipine ointment 0.3%  to help facilitate healing.  Instructed that she will need to get this a Recruitment consultant.   Will avoid topical NTG due to history of migraines.   Instructed to follow up if symptoms persist past 4 weeks or she has  worsening of symptoms.  ? ? ?No orders of the defined types were placed in this encounter. ? ? ?No follow-ups on file. ? ? ? ?This visit occurred during the SARS-CoV-2 public health emergency.  Safety protocols were in place, including screening questions prior to the visit, additional usage of staff PPE, and extensive cleaning of exam room while observing appropriate contact time as indicated for disinfecting solutions.  ? ?

## 2022-01-13 NOTE — Assessment & Plan Note (Signed)
Area appears to be a fissure rather than hemorrhoid.  Recommend increased fluid and fiber intake.  Instructed on use of Sitz bath.  Adding compounded nifedipine ointment 0.3%  to help facilitate healing.  Instructed that she will need to get this a Recruitment consultant.   Will avoid topical NTG due to history of migraines.   Instructed to follow up if symptoms persist past 4 weeks or she has worsening of symptoms.  ?

## 2022-01-13 NOTE — Patient Instructions (Addendum)
Increase fiber and fluid intake  ?Try  ?Use SITZ bath 2-3x per day ?Bathtub sitz baths: ? Fill the bathtub 2-3 inches with warm water (not too hot!) ? Ensure the entire perineum is submerged ? Drain and refill the bathtub if the water gets too cool ? Use a blow dryer on low warm setting to dry the perineal area after the sitz  ?bath (do not use a towel) ? Sit in the water for 10 minutes, 3 times per day ? ?Anal Fissure, Adult ? ?An anal fissure is a small tear or crack in the tissue of the anus. Bleeding from a fissure usually stops on its own within a few minutes. However, bleeding will often occur again with each bowel movement until the fissure heals. ?What are the causes? ?This condition is usually caused by passing a large or hard stool (feces). Other causes include: ?Constipation. ?Frequent diarrhea. ?Inflammatory bowel disease (Crohn's disease or ulcerative colitis). ?Childbirth. ?Infections. ?Anal sex. ?What are the signs or symptoms? ?Symptoms of this condition include: ?Bleeding from the rectum. ?Small amounts of blood seen on your stool, on the toilet paper, or in the toilet after a bowel movement. The blood coats the outside of the stool and is not mixed with the stool. ?Painful bowel movements. ?Itching or irritation around the anus. ?How is this diagnosed? ?A health care provider may diagnose this condition by closely examining the anal area. An anal fissure can usually be seen with careful inspection. In some cases, a rectal exam may be performed, or a short tube (anoscope) may be used to examine the anal canal. ?How is this treated? ?Initial treatment for this condition may include: ?Taking steps to avoid constipation. This may include making changes to your diet, such as increasing your intake of fiber or fluid. ?Taking fiber supplements. These supplements can soften your stool to help make bowel movements easier. Your health care provider may also prescribe a stool softener if your stool is  hard. ?Taking sitz baths. This may help to heal the tear. ?Using medicated creams or ointments. These may be prescribed to lessen discomfort. ?Treatments that are sometimes used if initial treatments do not work well or if the condition is more severe may include: ?Botulinum injection. ?Surgery to repair the fissure. ?Follow these instructions at home: ?Eating and drinking ? ?Avoid foods that may cause constipation, such as bananas, milk, and other dairy products. ?Eat all fruits, except bananas. ?Drink enough fluid to keep your urine pale yellow. ?Eat foods that are high in fiber, such as beans, whole grains, and fresh fruits and vegetables. ?General instructions ? ?Take over-the-counter and prescription medicines only as told by your health care provider. ?Use creams or ointments only as told by your health care provider. ?Keep the anal area clean and dry. ?Take sitz baths as told by your health care provider. Do not use soap in the sitz baths. ?Keep all follow-up visits as told by your health care provider. This is important. ?Contact a health care provider if you have: ?More bleeding. ?A fever. ?Diarrhea that is mixed with blood. ?Pain that continues. ?Ongoing problems that are getting worse rather than better. ?Summary ?An anal fissure is a small tear or crack in the tissue of the anus. This condition is usually caused by passing a large or hard stool (feces). Other causes include constipation and frequent diarrhea. ?Initial treatment for this condition may include taking steps to avoid constipation, such as increasing your intake of fiber or fluid. ?Follow instructions for  care as told by your health care provider. ?Contact your health care provider if you have more bleeding or your problem is getting worse rather than better. ?Keep all follow-up visits as told by your health care provider. This is important. ?This information is not intended to replace advice given to you by your health care provider. Make  sure you discuss any questions you have with your health care provider. ?Document Revised: 04/02/2021 Document Reviewed: 04/02/2021 ?Elsevier Patient Education ? [employer]. ? ?

## 2022-01-18 ENCOUNTER — Telehealth: Payer: Self-pay | Admitting: Family Medicine

## 2022-01-18 NOTE — Telephone Encounter (Signed)
Pt called.  She is following up on on paperwork for extended FMLA. faxed over last week. ?

## 2022-01-18 NOTE — Telephone Encounter (Signed)
I do not have any forms requested in my in basket.  Did she drop them off or fax them? ?

## 2022-01-19 NOTE — Telephone Encounter (Signed)
Patient advised. Patient stated she will reach out to her employer to have them refax forms.  ?

## 2022-01-20 NOTE — Telephone Encounter (Signed)
Completed and charge sheet added ?

## 2022-01-20 NOTE — Telephone Encounter (Signed)
Pt's forms received and placed in Dr. Gardiner Ramus box.  ?

## 2022-01-21 NOTE — Telephone Encounter (Signed)
LVM for patient that forms have been placed up front for pick up & there is a $29 fee. AMUCK ?

## 2022-01-21 NOTE — Telephone Encounter (Signed)
Forms placed up front, note attached for pt to be contacted to pay for form completion.  ?

## 2022-02-02 ENCOUNTER — Ambulatory Visit: Payer: 59 | Admitting: Family Medicine

## 2022-02-02 ENCOUNTER — Encounter: Payer: Self-pay | Admitting: Family Medicine

## 2022-02-02 VITALS — BP 134/84 | HR 73 | Ht 62.0 in | Wt 232.0 lb

## 2022-02-02 DIAGNOSIS — Z30011 Encounter for initial prescription of contraceptive pills: Secondary | ICD-10-CM | POA: Diagnosis not present

## 2022-02-02 DIAGNOSIS — M79605 Pain in left leg: Secondary | ICD-10-CM

## 2022-02-02 DIAGNOSIS — I10 Essential (primary) hypertension: Secondary | ICD-10-CM

## 2022-02-02 DIAGNOSIS — M79604 Pain in right leg: Secondary | ICD-10-CM

## 2022-02-02 DIAGNOSIS — G43009 Migraine without aura, not intractable, without status migrainosus: Secondary | ICD-10-CM

## 2022-02-02 DIAGNOSIS — R635 Abnormal weight gain: Secondary | ICD-10-CM | POA: Diagnosis not present

## 2022-02-02 MED ORDER — TOPIRAMATE 25 MG PO TABS: ORAL_TABLET | ORAL | 1 refills | Status: DC

## 2022-02-02 MED ORDER — VALSARTAN-HYDROCHLOROTHIAZIDE 80-12.5 MG PO TABS: 1.0000 | ORAL_TABLET | Freq: Every day | ORAL | 1 refills | Status: DC

## 2022-02-02 MED ORDER — RIZATRIPTAN BENZOATE 5 MG PO TABS: 5.0000 mg | ORAL_TABLET | ORAL | 5 refills | Status: DC | PRN

## 2022-02-02 MED ORDER — LEVONORGEST-ETH ESTRAD 91-DAY 0.15-0.03 &0.01 MG PO TABS: 1.0000 | ORAL_TABLET | Freq: Every day | ORAL | 3 refills | Status: DC

## 2022-02-02 NOTE — Assessment & Plan Note (Signed)
Discussed options for prophylaxis.  She is willing to consider a trial of Topamax again.  It did cause some tingling in her feet when she had taken it years before.  But we discussed that it could also help with curbing appetite and making portion controlling a little bit easier.  She is open to trying it again. ?

## 2022-02-02 NOTE — Assessment & Plan Note (Addendum)
She is willing to retry Topamax.  Also check her thyroid.  She is still considering weight loss surgery.  But would like to try this first.  Again she is already made some dietary changes. ?

## 2022-02-02 NOTE — Progress Notes (Signed)
? ?Established Patient Office Visit ? ?Subjective   ?Patient ID: Connie Carlson, female    DOB: 13-Apr-1981  Age: 41 y.o. MRN: [id number] ?  ?Chief Complaint  ?Patient presents with  ? Migraine  ? abnormal weight gain  ? ? ?HPI ? ?F/U Migraines - Allergies tend to be a trigger.  So she has been having more frequent headaches recently about 1/week where she is using her Imitrex.  Sometimes she just uses liquid Advil gelcaps and those work fine.  She had taken Topamax in the past but it did cause some tingling in her feet. ? ?Hypertension- Pt denies chest pain, SOB, dizziness, or heart palpitations.  Taking meds as directed w/o problems.  Denies medication side effects.   ? ?She is eating better and dec her carbs but she can't seem to lose weight.  She still thinking about weight loss surgery is an option but is just really intimidated by having surgery. ? ?He also reports she has been having some bilateral leg pain mostly over her outer legs all the way down to her ankles.  It seems to be worse at the end of the day.  She says it almost feels like when you exercise a lot and how your muscles feel the next day.  But she denies any cramping. ? ? ? ? ?ROS ? ?  ?Objective:  ?  ? ?BP 134/84   Pulse 73   Ht '5\' 2"'$  (1.575 m)   Wt 232 lb (105.2 kg)   SpO2 97%   BMI 42.43 kg/m?  ? ? ?Physical Exam ?Vitals and nursing note reviewed.  ?Constitutional:   ?   Appearance: She is well-developed.  ?HENT:  ?   Head: Normocephalic and atraumatic.  ?Cardiovascular:  ?   Rate and Rhythm: Normal rate and regular rhythm.  ?   Heart sounds: Normal heart sounds.  ?Pulmonary:  ?   Effort: Pulmonary effort is normal.  ?   Breath sounds: Normal breath sounds.  ?Skin: ?   General: Skin is warm and dry.  ?Neurological:  ?   Mental Status: She is alert and oriented to person, place, and time.  ?Psychiatric:     ?   Behavior: Behavior normal.  ? ? ? ?No results found for any visits on 02/02/22. ? ? ? ?The 10-year ASCVD risk score (Arnett DK, et  al., 2019) is: 0.7% ? ?  ?Assessment & Plan:  ? ?Problem List Items Addressed This Visit   ? ?  ? Cardiovascular and Mediastinum  ? Migraine headache without aura  ?  Discussed options for prophylaxis.  She is willing to consider a trial of Topamax again.  It did cause some tingling in her feet when she had taken it years before.  But we discussed that it could also help with curbing appetite and making portion controlling a little bit easier.  She is open to trying it again. ? ?  ?  ? Relevant Medications  ? rizatriptan (MAXALT) 5 MG tablet  ? valsartan-hydrochlorothiazide (DIOVAN-HCT) 80-12.5 MG tablet  ? topiramate (TOPAMAX) 25 MG tablet  ? Other Relevant Orders  ? TSH  ? Lipid Panel w/reflex Direct LDL  ? COMPLETE METABOLIC PANEL WITH GFR  ? CBC  ? Hypertension - Primary  ?  Blood pressure little borderline today but will continue to monitor. Due for labs.  ? ?  ?  ? Relevant Medications  ? valsartan-hydrochlorothiazide (DIOVAN-HCT) 80-12.5 MG tablet  ? Other Relevant Orders  ? TSH  ?  Lipid Panel w/reflex Direct LDL  ? COMPLETE METABOLIC PANEL WITH GFR  ? CBC  ?  ? Other  ? WEIGHT GAIN  ? Relevant Orders  ? TSH  ? Morbid obesity ([place])  ?  She is willing to retry Topamax.  Also check her thyroid.  She is still considering weight loss surgery.  But would like to try this first.  Again she is already made some dietary changes. ? ?  ?  ? ?Other Visit Diagnoses   ? ? Encounter for initial prescription of contraceptive pills      ? Relevant Medications  ? Levonorgestrel-Ethinyl Estradiol (SIMPESSE) 0.15-0.03 &0.01 MG tablet  ? Pain in both lower extremities      ? ?  ? ? ?Bilateral leg pain-sounds like a lot of it is just the stress and strain of working on her feet on concrete floors all day.  She works at [employer].  We discussed that compression stockings might help.  Though she is not getting any swelling per se.  It does not sound like muscle spasms like cramping.  I think it is just more of an achiness and  soreness.  We discussed the importance of wearing good supportive shoe wear. ? ?Return in about 2 months (around 04/04/2022) for Migraines and BP.  ? ? ?Beatrice Lecher, MD ? ?

## 2022-02-02 NOTE — Assessment & Plan Note (Addendum)
Blood pressure little borderline today but will continue to monitor. Due for labs.  ?

## 2022-02-03 NOTE — Progress Notes (Signed)
Call patient: Cholesterol looks great.  Thyroid looks great this time.  Metabolic panel is normal.  Blood count is also normal.

## 2022-02-08 LAB — CBC
HCT: 40.7 % (ref 35.0–45.0)
Hemoglobin: 13.3 g/dL (ref 11.7–15.5)
MCH: 29 pg (ref 27.0–33.0)
MCHC: 32.7 g/dL (ref 32.0–36.0)
MCV: 88.7 fL (ref 80.0–100.0)
MPV: 10 fL (ref 7.5–12.5)
Platelets: 317 10*3/uL (ref 140–400)
RBC: 4.59 10*6/uL (ref 3.80–5.10)
RDW: 13.7 % (ref 11.0–15.0)
WBC: 6.8 10*3/uL (ref 3.8–10.8)

## 2022-02-08 LAB — COMPLETE METABOLIC PANEL WITH GFR
AG Ratio: 1.4 (calc) (ref 1.0–2.5)
ALT: 17 U/L (ref 6–29)
AST: 20 U/L (ref 10–30)
Albumin: 4.1 g/dL (ref 3.6–5.1)
Alkaline phosphatase (APISO): 54 U/L (ref 31–125)
BUN: 13 mg/dL (ref 7–25)
CO2: 27 mmol/L (ref 20–32)
Calcium: 9.4 mg/dL (ref 8.6–10.2)
Chloride: 103 mmol/L (ref 98–110)
Creat: 0.79 mg/dL (ref 0.50–0.99)
Globulin: 3 g/dL (calc) (ref 1.9–3.7)
Glucose, Bld: 94 mg/dL (ref 65–99)
Potassium: 4.3 mmol/L (ref 3.5–5.3)
Sodium: 137 mmol/L (ref 135–146)
Total Bilirubin: 0.4 mg/dL (ref 0.2–1.2)
Total Protein: 7.1 g/dL (ref 6.1–8.1)
eGFR: 96 mL/min/{1.73_m2} (ref >=60)

## 2022-02-08 LAB — LIPID PANEL W/REFLEX DIRECT LDL
Cholesterol: 139 mg/dL (ref <200)
HDL: 49 mg/dL — ABNORMAL LOW (ref >=50)
LDL Cholesterol (Calc): 74 mg/dL (calc)
Non-HDL Cholesterol (Calc): 90 mg/dL (calc) (ref <130)
Total CHOL/HDL Ratio: 2.8 (calc) (ref <5.0)
Triglycerides: 76 mg/dL (ref <150)

## 2022-02-08 LAB — URIC ACID

## 2022-02-08 LAB — HIV ANTIBODY (ROUTINE TESTING W REFLEX)

## 2022-02-08 LAB — TSH: TSH: 1.26 mIU/L

## 2022-02-08 LAB — RPR

## 2022-02-09 NOTE — Progress Notes (Signed)
Call patient: There was not enough blood to run HIV, RPR or uric acid level.  We can reorder these and she can come to the lab at her convenience.

## 2022-03-19 ENCOUNTER — Encounter: Payer: Self-pay | Admitting: Medical-Surgical

## 2022-03-19 ENCOUNTER — Ambulatory Visit: Payer: 59 | Admitting: Medical-Surgical

## 2022-03-19 VITALS — BP 106/69 | HR 66 | Resp 20 | Ht 62.0 in | Wt 219.4 lb

## 2022-03-19 DIAGNOSIS — Z113 Encounter for screening for infections with a predominantly sexual mode of transmission: Secondary | ICD-10-CM | POA: Diagnosis not present

## 2022-03-19 DIAGNOSIS — N898 Other specified noninflammatory disorders of vagina: Secondary | ICD-10-CM

## 2022-03-19 DIAGNOSIS — Z3200 Encounter for pregnancy test, result unknown: Secondary | ICD-10-CM

## 2022-03-19 LAB — POCT URINALYSIS DIP (CLINITEK)
Bilirubin, UA: NEGATIVE
Glucose, UA: 100 mg/dL — AB
Ketones, POC UA: NEGATIVE mg/dL
Leukocytes, UA: NEGATIVE
Nitrite, UA: POSITIVE — AB
POC PROTEIN,UA: 30 — AB
Spec Grav, UA: >=1.03 — AB (ref 1.010–1.025)
Urobilinogen, UA: 1 E.U./dL
pH, UA: 5 (ref 5.0–8.0)

## 2022-03-19 LAB — POCT URINE PREGNANCY: Preg Test, Ur: NEGATIVE

## 2022-03-19 NOTE — Progress Notes (Signed)
Established Patient Office Visit  Subjective   Patient ID: Connie Carlson, female   DOB: 06/26/1981 Age: 41 y.o. MRN: [id number]   Chief Complaint  Patient presents with   Vaginitis   Exposure to STD   HPI Pleasant 41 year old female presenting today with complaints of 2 weeks of vaginal itching, irritation, and swelling. Has been taking AZO off and on but denies current dysuria, frequency, urgency, suprapubic pressure, flank pain, nausea/vomiting, fever, or chills. No vaginal discharge noted but did start her menstrual cycle yesterday. Changed brands of vaginal wash to Burnell Blanks a few days before the symptoms started but symptoms continued after stopping it and did not improve at all. Tried using Monistat for a few days, no relief. Tried using Vagisil but that did not help either.   Requesting STI testing as she is married but has had some relationship issues with her husband and wonders if he may have had another partner. She does not know of any other partner but worries. She has not had intercourse with him in several months. A few days into her above symptoms, he mentioned that he was having groin/penile itching and had developed small red bumps on his penis.   Requesting a pregnancy test despite starting her menses. Was taking weight loss medication that affects birth control and did not use a second form of birth control after their last intercourse. Notes that she would just like peace of mind.    Objective:    Vitals:   03/19/22 1619  BP: 106/69  Pulse: 66  Resp: 20  Height: '5\' 2"'$  (1.575 m)  Weight: 219 lb 6.4 oz (99.5 kg)  SpO2: 98%  BMI (Calculated): 40.12    Physical Exam Vitals and nursing note reviewed.  Constitutional:      General: She is not in acute distress.    Appearance: Normal appearance. She is obese. She is not ill-appearing.  HENT:     Head: Normocephalic and atraumatic.  Cardiovascular:     Rate and Rhythm: Normal rate and regular rhythm.     Pulses:  Normal pulses.     Heart sounds: Normal heart sounds.  Pulmonary:     Effort: Pulmonary effort is normal. No respiratory distress.     Breath sounds: Normal breath sounds. No wheezing, rhonchi or rales.  Skin:    General: Skin is warm and dry.  Neurological:     Mental Status: She is alert and oriented to person, place, and time.  Psychiatric:        Mood and Affect: Mood normal.        Behavior: Behavior normal.        Thought Content: Thought content normal.        Judgment: Judgment normal.    Results for orders placed or performed in visit on 03/19/22 (from the past 24 hour(s))  POCT URINALYSIS DIP (CLINITEK)     Status: Abnormal   Collection Time: 03/19/22  4:31 PM  Result Value Ref Range   Color, UA other (A) yellow   Clarity, UA clear clear   Glucose, UA =100 (A) negative mg/dL   Bilirubin, UA negative negative   Ketones, POC UA negative negative mg/dL   Spec Grav, UA >=1.030 (A) 1.010 - 1.025   Blood, UA moderate (A) negative   pH, UA 5.0 5.0 - 8.0   POC PROTEIN,UA =30 (A) negative, trace   Urobilinogen, UA 1.0 0.2 or 1.0 E.U./dL   Nitrite, UA Positive (A) Negative  Leukocytes, UA Negative Negative  POCT urine pregnancy     Status: Normal   Collection Time: 03/19/22  4:48 PM  Result Value Ref Range   Preg Test, Ur Negative Negative       The 10-year ASCVD risk score (Arnett DK, et al., 2019) is: 0.3%   Values used to calculate the score:     Age: 39 years     Sex: Female     Is Non-Hispanic African American: No     Diabetic: No     Tobacco smoker: No     Systolic Blood Pressure: 612 mmHg     Is BP treated: Yes     HDL Cholesterol: 49 mg/dL     Total Cholesterol: 139 mg/dL   Assessment & Plan:   1. Vaginal itching Wet prep STAT. Urinalysis showing positive blood, nitrites, protein, and glucose. Specific gravity high. Sending for culture and GC/Chlamydia.  - WET PREP FOR TRICH, YEAST, CLUE - C. trachomatis/N. gonorrhoeae RNA - POCT URINALYSIS DIP  (CLINITEK) - Urine Culture  2. Routine screening for STI (sexually transmitted infection) Checking for STIs as below.  - HIV Antibody (routine testing w rflx) - Hepatitis C Antibody - Hepatitis B surface antigen - RPR  3. Possible pregnancy POCT UPT negative.  - POCT urine pregnancy  Return if symptoms worsen or fail to improve. ___________________________________________ Clearnce Sorrel, DNP, APRN, FNP-BC [facility]

## 2022-03-20 LAB — WET PREP FOR TRICH, YEAST, CLUE
MICRO NUMBER:: 13594314
Specimen Quality: ADEQUATE

## 2022-03-20 LAB — C. TRACHOMATIS/N. GONORRHOEAE RNA
C. trachomatis RNA, TMA: NOT DETECTED
N. gonorrhoeae RNA, TMA: NOT DETECTED

## 2022-03-21 LAB — URINE CULTURE
MICRO NUMBER:: 13598076
SPECIMEN QUALITY:: ADEQUATE

## 2022-04-01 ENCOUNTER — Other Ambulatory Visit: Payer: Self-pay | Admitting: Family Medicine

## 2022-04-01 DIAGNOSIS — G43009 Migraine without aura, not intractable, without status migrainosus: Secondary | ICD-10-CM

## 2022-04-06 ENCOUNTER — Encounter: Payer: Self-pay | Admitting: Family Medicine

## 2022-04-06 ENCOUNTER — Ambulatory Visit: Payer: 59 | Admitting: Family Medicine

## 2022-04-06 VITALS — BP 121/76 | HR 64 | Ht 62.0 in | Wt 216.0 lb

## 2022-04-06 DIAGNOSIS — R7301 Impaired fasting glucose: Secondary | ICD-10-CM | POA: Diagnosis not present

## 2022-04-06 DIAGNOSIS — N921 Excessive and frequent menstruation with irregular cycle: Secondary | ICD-10-CM

## 2022-04-06 DIAGNOSIS — G43009 Migraine without aura, not intractable, without status migrainosus: Secondary | ICD-10-CM

## 2022-04-06 DIAGNOSIS — L292 Pruritus vulvae: Secondary | ICD-10-CM | POA: Diagnosis not present

## 2022-04-06 DIAGNOSIS — I10 Essential (primary) hypertension: Secondary | ICD-10-CM | POA: Diagnosis not present

## 2022-04-06 DIAGNOSIS — N920 Excessive and frequent menstruation with regular cycle: Secondary | ICD-10-CM | POA: Insufficient documentation

## 2022-04-06 LAB — POCT GLYCOSYLATED HEMOGLOBIN (HGB A1C): Hemoglobin A1C: 5.1 % (ref 4.0–5.6)

## 2022-04-06 MED ORDER — NYSTATIN 100000 UNIT/GM EX CREA: 1.0000 | TOPICAL_CREAM | Freq: Two times a day (BID) | CUTANEOUS | 2 refills | Status: DC

## 2022-04-06 NOTE — Progress Notes (Signed)
Established Patient Office Visit  Subjective   Patient ID: Connie Carlson, female    DOB: May 19, 1981  Age: 41 y.o. MRN: [id number]  Chief Complaint  Patient presents with   Hypertension   Migraine    HPI   Hypertension- Pt denies chest pain, SOB, dizziness, or heart palpitations.  Taking meds as directed w/o problems.  Denies medication side effects.    F/U Migraines -we had decided to retry Topamax back in May..  She reports so far it does seem to be helping tremendously with her migraine headaches.  Though she is still getting about 3 a week.  Usually work triggered.  Try to come home and lay down.  She has noticed a little bit of tingling in her legs which she had noted previously when she had taken the medication but says its not as severe so she would like to continue it.  She is really tried to make some major dietary changes she quit eating red meat and pork she really cut out sugar.  She has been trying to eat more vegetables.  She is down about 3 pounds.  She has not started exercising yet but says she would really like to she just has not found a way to really fit into her schedule.  Lease see prior visit note.  She still having some external vaginal itching.  She still struggling with frequent and heavy menstrual bleeding.  She would like to get back in with GYN.  She had started down a treatment course a couple years ago but had a bad experience with one of the procedures and so did not return.    ROS    Objective:     BP 121/76   Pulse 64   Ht '5\' 2"'$  (1.575 m)   Wt 216 lb (98 kg)   SpO2 99%   BMI 39.51 kg/m    Physical Exam Vitals and nursing note reviewed.  Constitutional:      Appearance: She is well-developed.  HENT:     Head: Normocephalic and atraumatic.  Cardiovascular:     Rate and Rhythm: Normal rate and regular rhythm.     Heart sounds: Normal heart sounds.  Pulmonary:     Effort: Pulmonary effort is normal.     Breath sounds: Normal breath  sounds.  Skin:    General: Skin is warm and dry.  Neurological:     Mental Status: She is alert and oriented to person, place, and time.  Psychiatric:        Behavior: Behavior normal.      Results for orders placed or performed in visit on 04/06/22  POCT glycosylated hemoglobin (Hb A1C)  Result Value Ref Range   Hemoglobin A1C 5.1 4.0 - 5.6 %   HbA1c POC (<> result, manual entry)     HbA1c, POC (prediabetic range)     HbA1c, POC (controlled diabetic range)        The 10-year ASCVD risk score (Arnett DK, et al., 2019) is: 0.4%    Assessment & Plan:   Problem List Items Addressed This Visit       Cardiovascular and Mediastinum   Migraine headache without aura   Hypertension - Primary    Well controlled. Continue current regimen. Follow up in  6 months.         Endocrine   IFG (impaired fasting glucose)    Lab Results  Component Value Date   HGBA1C 5.1 04/06/2022   Well controlled. Continue  current regimen. Follow up in 6-12 months.       Relevant Orders   POCT glycosylated hemoglobin (Hb A1C) (Completed)     Other   Severe obesity (BMI 35.0-39.9) with comorbidity ([place])    She has really made some absolutely fantastic changes.  Keep up the good work.      Menorrhagia   Relevant Orders   Ambulatory referral to Obstetrics / Gynecology   Other Visit Diagnoses     Vulvar itching       Relevant Medications   nystatin cream (MYCOSTATIN)      Vulvar itching-we will treat with nystatin cream if not better in 1 week then please let me know.  Consider also could be more of a contact irritation from soap detergent etc.  She has not noticed a rash.  Return in about 6 months (around 10/07/2022) for Hypertension.    Beatrice Lecher, MD

## 2022-04-06 NOTE — Assessment & Plan Note (Addendum)
Lab Results  Component Value Date   HGBA1C 5.1 04/06/2022    Well controlled. Continue current regimen. Follow up in 6-12 months.

## 2022-04-06 NOTE — Assessment & Plan Note (Signed)
She has really made some absolutely fantastic changes.  Keep up the good work.

## 2022-04-06 NOTE — Assessment & Plan Note (Signed)
Well controlled. Continue current regimen. Follow up in  6 months.  

## 2022-04-23 ENCOUNTER — Telehealth: Payer: Self-pay

## 2022-04-23 NOTE — Telephone Encounter (Signed)
Connie Carlson called and states she has had a lot of menstrual bleeding with more pain than usual. She has appointment with GYN on Monday. She states she needs a work note for Friday and Saturday. She has Monday off and will return to work on Tuesday.

## 2022-04-26 ENCOUNTER — Ambulatory Visit: Payer: 59 | Admitting: Obstetrics & Gynecology

## 2022-04-26 ENCOUNTER — Other Ambulatory Visit (HOSPITAL_COMMUNITY)
Admission: RE | Admit: 2022-04-26 | Discharge: 2022-04-26 | Disposition: A | Discharge status: Home / Self Care | Payer: 59 | Source: Ambulatory Visit | Attending: Obstetrics & Gynecology | Admitting: Obstetrics & Gynecology

## 2022-04-26 ENCOUNTER — Encounter: Payer: Self-pay | Admitting: Obstetrics & Gynecology

## 2022-04-26 VITALS — BP 132/80 | HR 85 | Ht 61.0 in | Wt 216.0 lb

## 2022-04-26 DIAGNOSIS — N920 Excessive and frequent menstruation with regular cycle: Secondary | ICD-10-CM

## 2022-04-26 LAB — POCT URINE PREGNANCY: Preg Test, Ur: NEGATIVE

## 2022-04-26 NOTE — Telephone Encounter (Signed)
Ok for work note? 

## 2022-04-26 NOTE — Progress Notes (Signed)
   Subjective:    Patient ID: Connie Carlson, female    DOB: 31-May-1981, 41 y.o.   MRN: [id number]  HPI  41 yo female presents with constant lower pelvic pain for several months and getting very bad last 2 months.  Missed work this weekend.  Pt having increased bleeding and sometimes old brown blood.  Has to wear pad everyday.  Pain and bleeding worse with bending down.  Patient has been taking oral contraceptives which has not helped the bleeding or pain.  Review of Systems  Constitutional: Negative.   Respiratory: Negative.    Cardiovascular: Negative.   Gastrointestinal:  Positive for abdominal pain.  Genitourinary:  Positive for menstrual problem, pelvic pain and vaginal bleeding.       Objective:   Physical Exam Vitals reviewed.  Constitutional:      General: She is not in acute distress.    Appearance: She is well-developed.  HENT:     Head: Normocephalic and atraumatic.  Eyes:     Conjunctiva/sclera: Conjunctivae normal.  Cardiovascular:     Rate and Rhythm: Normal rate.  Pulmonary:     Effort: Pulmonary effort is normal.  Genitourinary:    Comments: Tanner V Vulva:  No lesion Vagina:  Pink, no lesions, no discharge, no blood Cervix:  No CMT Uterus:  Non tender, limited by habitus Right adnexa--non tender,limited by habitus Left adnexa--non tender, limited by habitus Skin:    General: Skin is warm and dry.  Neurological:     Mental Status: She is alert and oriented to person, place, and time.  Psychiatric:        Mood and Affect: Mood normal.    Vitals:   04/26/22 1450  BP: 132/80  Pulse: 85  Weight: 216 lb (98 kg)  Height: '5\' 1"'$  (1.549 m)      Assessment & Plan:  41 year old female with pelvic pain and abnormal uterine bleeding Repeat pelvic ultrasound.  Last one was done March 2022 Aptima swab for vaginal discharge Endometrial biopsy done today We will base treatment on results of above.  ENDOMETRIAL BIOPSY     The indications for endometrial biopsy  were reviewed.   Risks of the biopsy including cramping, bleeding, infection, uterine perforation, inadequate specimen and need for additional procedures  were discussed. The patient states she understands and agrees to undergo procedure today. Consent was signed. Time out was performed. Urine HCG was negative. A sterile speculum was placed in the patient's vagina and the cervix was prepped with Betadine. A single-toothed tenaculum was placed on the anterior lip of the cervix to stabilize it. The 3 mm pipelle was introduced into the endometrial cavity without difficulty to a depth of 8 cm, and a moderate amount of tissue was obtained and sent to pathology. The instruments were removed from the patient's vagina. Minimal bleeding from the cervix was noted. The patient tolerated the procedure well. Routine post-procedure instructions were given to the patient. The patient will follow up to review the results and for further management.

## 2022-04-27 ENCOUNTER — Telehealth: Payer: Self-pay | Admitting: *Deleted

## 2022-04-27 LAB — CERVICOVAGINAL ANCILLARY ONLY
Bacterial Vaginitis (gardnerella): NEGATIVE
Candida Glabrata: NEGATIVE
Candida Vaginitis: NEGATIVE
Chlamydia: NEGATIVE
Comment: NEGATIVE
Comment: NEGATIVE
Comment: NEGATIVE
Comment: NEGATIVE
Comment: NEGATIVE
Comment: NORMAL
Neisseria Gonorrhea: NEGATIVE
Trichomonas: NEGATIVE

## 2022-04-27 NOTE — Telephone Encounter (Signed)
Left patient a message to call the office if she needs help with her Alvan.

## 2022-04-28 LAB — SURGICAL PATHOLOGY

## 2022-04-28 NOTE — Telephone Encounter (Signed)
GYN wrote letter.

## 2022-05-04 ENCOUNTER — Encounter: Payer: Self-pay | Admitting: Obstetrics & Gynecology

## 2022-05-04 ENCOUNTER — Other Ambulatory Visit: Payer: Self-pay | Admitting: Obstetrics & Gynecology

## 2022-05-04 MED ORDER — MEGESTROL ACETATE 40 MG PO TABS: ORAL_TABLET | ORAL | 1 refills | Status: DC

## 2022-05-04 NOTE — Progress Notes (Signed)
Megace prescribed.  Will send my chart message.  Kisha's last note said she was having trouble with my chart password.  Can you call tomorrow and see if she got my message. Sent to [place].  I discontinued her OCPs.  Please have her use condoms while changing therapies.

## 2022-05-10 ENCOUNTER — Ambulatory Visit (INDEPENDENT_AMBULATORY_CARE_PROVIDER_SITE_OTHER): Payer: Managed Care, Other (non HMO)

## 2022-05-10 DIAGNOSIS — N920 Excessive and frequent menstruation with regular cycle: Secondary | ICD-10-CM | POA: Diagnosis not present

## 2022-05-11 ENCOUNTER — Other Ambulatory Visit: Payer: Self-pay | Admitting: Family Medicine

## 2022-05-11 DIAGNOSIS — G43009 Migraine without aura, not intractable, without status migrainosus: Secondary | ICD-10-CM

## 2022-05-17 ENCOUNTER — Ambulatory Visit: Payer: Managed Care, Other (non HMO) | Admitting: Obstetrics & Gynecology

## 2022-06-07 ENCOUNTER — Encounter: Payer: Self-pay | Admitting: Obstetrics & Gynecology

## 2022-06-07 ENCOUNTER — Ambulatory Visit: Payer: 59 | Admitting: Obstetrics & Gynecology

## 2022-06-07 VITALS — BP 119/76 | HR 73 | Resp 16 | Ht 61.0 in | Wt 211.0 lb

## 2022-06-07 DIAGNOSIS — I10 Essential (primary) hypertension: Secondary | ICD-10-CM | POA: Diagnosis not present

## 2022-06-07 DIAGNOSIS — Z0000 Encounter for general adult medical examination without abnormal findings: Secondary | ICD-10-CM

## 2022-06-07 DIAGNOSIS — N939 Abnormal uterine and vaginal bleeding, unspecified: Secondary | ICD-10-CM | POA: Diagnosis not present

## 2022-06-07 NOTE — Progress Notes (Signed)
Subjective:    Patient ID: Connie Carlson, female    DOB: 11-16-1980, 41 y.o.   MRN: [id number]  HPI  41 yo female presented with menomet and pelvic pain on OCPs.  Pt had endometrial biopsy and [place] as below.  Pt also was given provera.  Bleeding has stopped and she does not report pelvic pain today.   CLINICAL DATA:  Menorrhagia, pelvic pain for 2-3 months, past history of endometrial biopsy with benign results, uncertain LMP   EXAM: TRANSABDOMINAL AND TRANSVAGINAL ULTRASOUND OF PELVIS   TECHNIQUE: Both transabdominal and transvaginal ultrasound examinations of the pelvis were performed. Transabdominal technique was performed for global imaging of the pelvis including uterus, ovaries, adnexal regions, and pelvic cul-de-sac. It was necessary to proceed with endovaginal exam following the transabdominal exam to visualize the endometrium and LEFT ovary.   COMPARISON:  10/29/2020   FINDINGS: Uterus   Measurements: 8.1 x 3.7 x 5.4 cm = volume: 87 mL. Anteverted. Normal morphology without mass. Heterogeneous appearing prominent glands along the endocervical canal as well as scattered nabothian cysts, unchanged.   Endometrium   Thickness: 11 mm. No endometrial fluid. Small hyperechoic nodule 4 x 2 x 3 mm at posterior upper uterine segment endometrial complex, not seen on previous exam.   Right ovary   Measurements: 2.1 x 1.0 x 1.0 cm = volume: 1.2 mL. Normal morphology without mass   Left ovary   Measurements: 1.7 x 1.3 x 1.1 cm = volume: 1.2 mL. Normal morphology without mass   Other findings   No free pelvic fluid or adnexal masses.   IMPRESSION: 4 mm hyperechoic nodule at upper uterine segment endometrial complex, uncertain if represents a tiny polyp or sequela of recent endometrial biopsy; consider follow-up transvaginal ultrasound in 12 weeks to assess for persistence.   Remainder of exam unremarkable.     SURGICAL PATHOLOGY  CASE: [id number]  PATIENT: Connie  Carlson  Surgical Pathology Report   FINAL MICROSCOPIC DIAGNOSIS:   A. ENDOMETRIUM, BIOPSY:  - Benign inactive endometrium with hormone effect  - Negative for hyperplasia or malignancy  Review of Systems  Constitutional: Negative.   Respiratory: Negative.    Cardiovascular: Negative.   Gastrointestinal: Negative.   Genitourinary: Negative.        Objective:   Physical Exam Vitals reviewed.  Constitutional:      General: She is not in acute distress.    Appearance: She is well-developed.  HENT:     Head: Normocephalic and atraumatic.  Eyes:     Conjunctiva/sclera: Conjunctivae normal.  Cardiovascular:     Rate and Rhythm: Normal rate.  Pulmonary:     Effort: Pulmonary effort is normal.  Skin:    General: Skin is warm and dry.  Neurological:     Mental Status: She is alert and oriented to person, place, and time.  Psychiatric:        Mood and Affect: Mood normal.   Vitals:   06/07/22 1533  BP: 119/76  Pulse: 73  Resp: 16  Weight: 211 lb (95.7 kg)  Height: '5\' 1"'$  (1.549 m)      Assessment & Plan:  41 yo female with menometrorrhagia now resolved.    Reviewed biopsy and [place] reports. Out of abundance of caution we will repeat the [place] in 12 weeks to look at hyperechoic nodule. At our next visit we will discuss the risks of being on COC and having HTN as well as alternatives.  I sent note to patient through my chart  about risks and moving up appointment.

## 2022-06-07 NOTE — Patient Instructions (Signed)
Return after [place] in November

## 2022-06-12 ENCOUNTER — Encounter: Payer: Self-pay | Admitting: Obstetrics & Gynecology

## 2022-06-29 ENCOUNTER — Telehealth: Payer: Self-pay | Admitting: *Deleted

## 2022-06-29 ENCOUNTER — Ambulatory Visit: Payer: 59 | Admitting: Family Medicine

## 2022-06-29 ENCOUNTER — Encounter: Payer: Self-pay | Admitting: Family Medicine

## 2022-06-29 VITALS — BP 128/67 | HR 62 | Ht 61.0 in | Wt 210.0 lb

## 2022-06-29 DIAGNOSIS — F4329 Adjustment disorder with other symptoms: Secondary | ICD-10-CM

## 2022-06-29 DIAGNOSIS — G43009 Migraine without aura, not intractable, without status migrainosus: Secondary | ICD-10-CM | POA: Diagnosis not present

## 2022-06-29 DIAGNOSIS — M545 Low back pain, unspecified: Secondary | ICD-10-CM | POA: Diagnosis not present

## 2022-06-29 MED ORDER — MELOXICAM 15 MG PO TABS: 15.0000 mg | ORAL_TABLET | Freq: Every day | ORAL | 1 refills | Status: DC | PRN

## 2022-06-29 MED ORDER — TOPIRAMATE 50 MG PO TABS
50.0000 mg | ORAL_TABLET | Freq: Two times a day (BID) | ORAL | 1 refills | Status: DC
Start: 2022-06-29 — End: 2023-02-08

## 2022-06-29 NOTE — Assessment & Plan Note (Signed)
I with her that the increase stress and lack of sleep is probably really increasing her migraines.  For the short-term though we did discuss increasing her Topamax to 50 mg.  Continue with Maxalt as needed.

## 2022-06-29 NOTE — Progress Notes (Signed)
Established Patient Office Visit  Subjective   Patient ID: Connie Carlson, female    DOB: 04-Oct-1980  Age: 41 y.o. MRN: [id number]  Chief Complaint  Patient presents with   Back Pain   Migraine    HPI  More frequent migraines  - 2 per week. Maxalt does help.  Getting nausea or vomiting with them. She hs been under mor stress and feels that is triggering her migraine.   She also still is experiencing a lot of stress at work and with a previous emotional relationship.  She says she is given a lot of thought and would like referral for therapy/counseling.  Having more low back and bilateral leg pain x 1 months.  She works at [employer] and does do a lot of bending pushing pulling lifting etc.  Sometimes she will get pain down into her legs as well.  No numbness or tingling.  She will sometimes wake up with back stiffness.  After working an 8-hour shift sometimes she will just feel like she is "broken" and has no more energy.  She does feel completely exhausted.    ROS    Objective:     BP 128/67   Pulse 62   Ht '5\' 1"'$  (1.549 m)   Wt 210 lb (95.3 kg)   SpO2 98%   BMI 39.68 kg/m    Physical Exam Vitals and nursing note reviewed.  Constitutional:      Appearance: She is well-developed.  HENT:     Head: Normocephalic and atraumatic.  Cardiovascular:     Rate and Rhythm: Normal rate and regular rhythm.     Heart sounds: Normal heart sounds.  Pulmonary:     Effort: Pulmonary effort is normal.     Breath sounds: Normal breath sounds.  Skin:    General: Skin is warm and dry.  Neurological:     Mental Status: She is alert and oriented to person, place, and time.  Psychiatric:        Behavior: Behavior normal.      No results found for any visits on 06/29/22.    The 10-year ASCVD risk score (Arnett DK, et al., 2019) is: 0.5%    Assessment & Plan:   Problem List Items Addressed This Visit       Cardiovascular and Mediastinum   Migraine headache without aura    I  with her that the increase stress and lack of sleep is probably really increasing her migraines.  For the short-term though we did discuss increasing her Topamax to 50 mg.  Continue with Maxalt as needed.      Relevant Medications   meloxicam (MOBIC) 15 MG tablet   topiramate (TOPAMAX) 50 MG tablet     Other   Stress and adjustment reaction - Primary    Going to place referral for therapy/counseling I think would be extremely helpful.      Relevant Orders   Ambulatory referral to [facility]   Other Visit Diagnoses     Acute midline low back pain without sciatica       Relevant Medications   meloxicam (MOBIC) 15 MG tablet      Bilateral low back pain with SI joint tenderness-given handout to do some home PT on her own for the next 2 to 3 weeks as well as a trial of an anti-inflammatory.  If not improving then encouraged her to give [place] a call back so we can refer her for formal physical therapy and further  work-up.  Urged her to make sure she is wearing good supporting shoes at work since she does stand on concrete all day.  Return in about 3 months (around 09/29/2022) for Migraines .    Beatrice Lecher, MD

## 2022-06-29 NOTE — Telephone Encounter (Signed)
Left patient a message to come pick up work letter on 06/30/2022 from 8:00 - 11:30 AM or 1:00 - 4:30 PM.

## 2022-06-29 NOTE — Patient Instructions (Addendum)
If your back is not improving over the next 2-3 weeks then please call me back and we can refer you for formal PT.

## 2022-06-29 NOTE — Assessment & Plan Note (Signed)
Going to place referral for therapy/counseling I think would be extremely helpful.

## 2022-07-28 ENCOUNTER — Telehealth: Payer: Self-pay | Admitting: Family Medicine

## 2022-07-28 NOTE — Telephone Encounter (Signed)
FMLA completed and placed in [place] B box.

## 2022-07-28 NOTE — Telephone Encounter (Signed)
Form completed,pt call and advised of needing to pay for forms for these to be released to be faxed or given to her to give to her employer. She stated that she would call back to take care of this. Forms placed up front.

## 2022-08-03 ENCOUNTER — Ambulatory Visit (INDEPENDENT_AMBULATORY_CARE_PROVIDER_SITE_OTHER): Payer: 59

## 2022-08-03 DIAGNOSIS — N858 Other specified noninflammatory disorders of uterus: Secondary | ICD-10-CM | POA: Diagnosis not present

## 2022-08-03 DIAGNOSIS — N939 Abnormal uterine and vaginal bleeding, unspecified: Secondary | ICD-10-CM

## 2022-08-19 ENCOUNTER — Telehealth: Payer: Self-pay

## 2022-08-19 ENCOUNTER — Telehealth: Payer: Self-pay | Admitting: *Deleted

## 2022-08-19 NOTE — Telephone Encounter (Signed)
Pt called and stated that yesterday while she was out at the grocery store she had an episode that she felt lightheaded and dizzy. This lasted for 1 minute. She said that she decided to go to her car and sit and she had another episode that lasted a litter longer. I asked her if she had enough to eat and drink prior to this happening she said that she really had not. I asked if she had anymore episodes once she got home or this morning she stated that she hasn't. I advised her to hydrate and eat a little more and monitor her sxs and call if anything changes. She voiced understanding and agreed to plan.

## 2022-08-19 NOTE — Telephone Encounter (Signed)
Opened in error

## 2022-08-24 ENCOUNTER — Ambulatory Visit (INDEPENDENT_AMBULATORY_CARE_PROVIDER_SITE_OTHER): Payer: 59 | Admitting: Family Medicine

## 2022-08-24 ENCOUNTER — Encounter: Payer: Self-pay | Admitting: Family Medicine

## 2022-08-24 VITALS — BP 127/80 | HR 77 | Ht 61.0 in | Wt 207.1 lb

## 2022-08-24 DIAGNOSIS — I10 Essential (primary) hypertension: Secondary | ICD-10-CM | POA: Diagnosis not present

## 2022-08-24 DIAGNOSIS — R42 Dizziness and giddiness: Secondary | ICD-10-CM | POA: Diagnosis not present

## 2022-08-24 MED ORDER — VALSARTAN-HYDROCHLOROTHIAZIDE 160-12.5 MG PO TABS: 1.0000 | ORAL_TABLET | Freq: Every day | ORAL | 1 refills | Status: DC

## 2022-08-24 NOTE — Progress Notes (Signed)
   Established Patient Office Visit  Subjective   Patient ID: Connie Carlson, female    DOB: Dec 19, 1980  Age: 41 y.o. MRN: [id number]  Chief Complaint  Patient presents with   Hypertension    HPI  Patient is a 41 year old female presenting to clinic with concerns about blood pressure.  Last Thursday, she went to the grocery store after work and felt lightheaded and off balance. Denies any dizziness. This episode last a few minutes and then occurred about 20 minutes later. Since then she has had a headache and lightheadedness. No more episodes of unsteadiness. She does say she works a stressful job. She checked her blood pressures the last few days and it has been in the upper 140 and low 150s. She has been taking blood pressure medications. She has been making an effort to eat healthier watching sugars and carbohydrate and increasing her water intake.   She also states she has not been sleeping well. She can go to sleep, but around 2 am she wakes up and stays awake until 5 am.   Review of Systems  Constitutional:  Positive for malaise/fatigue.  Cardiovascular:  Negative for chest pain and palpitations.  Neurological:  Positive for dizziness and headaches.  Psychiatric/Behavioral:  The patient has insomnia.       Objective:     BP 127/80   Pulse 77   Ht '5\' 1"'$  (1.549 m)   Wt 93.9 kg   SpO2 96%   BMI 39.13 kg/m    Physical Exam Cardiovascular:     Rate and Rhythm: Normal rate and regular rhythm.  Pulmonary:     Effort: Pulmonary effort is normal.     Breath sounds: Normal breath sounds.  Neurological:     General: No focal deficit present.     Mental Status: She is alert.  Psychiatric:        Mood and Affect: Mood normal.      No results found for any visits on 08/24/22.    The 10-year ASCVD risk score (Arnett DK, et al., 2019) is: 0.5%    Assessment & Plan:   Problem List Items Addressed This Visit       Cardiovascular and Mediastinum   Hypertension    Relevant Medications   valsartan-hydrochlorothiazide (DIOVAN-HCT) 160-12.5 MG tablet   Other Visit Diagnoses     Lightheadedness    -  Primary   Relevant Orders   COMPLETE METABOLIC PANEL WITH GFR   CBC   EKG 12-Lead      Hypertension and Lightheadedness Increasing Diovan to 160-12.5 mg. Performed EKG today in office. CBC ordered. Call office if any of these episodes occur again.   Insomnia Take Melatonin OTC. Alternative can be taking Benadryl OTC. If there are no improvements in sleep habits, we can further discuss other options including Trazodone.  Return in about 6 months (around 02/23/2023) for Hypertension and Prediabetes.    Dorian Heckle, Student-PA

## 2022-08-24 NOTE — Progress Notes (Signed)
   I personally interviewed and examined the patient and agree with the assessment and plan below.   EKG today shows rate of 4 bpm, normal sinus rhythm with no acute ST-T wave changes.  No old EKG for comparison.  We will plan to recheck blood pressure before she goes today so that we can adjust her medication if needed since it sounds like she is been getting some pressures in the 140s pretty consistently at home and it was in the 140s when she was here last time.  Repeat blood pressure did look much better but again I just increase the valsartan component of the Diovan HCT to 160 mg and continue with the 12.5 mg of the HCTZ.  Keep regular follow-up in 6 months.   Meds ordered this encounter  Medications   valsartan-hydrochlorothiazide (DIOVAN-HCT) 160-12.5 MG tablet    Sig: Take 1 tablet by mouth daily.    Dispense:  90 tablet    Refill:  1

## 2022-08-26 ENCOUNTER — Ambulatory Visit (INDEPENDENT_AMBULATORY_CARE_PROVIDER_SITE_OTHER): Payer: 59 | Admitting: Professional

## 2022-08-26 ENCOUNTER — Encounter: Payer: Self-pay | Admitting: Professional

## 2022-08-26 DIAGNOSIS — F330 Major depressive disorder, recurrent, mild: Secondary | ICD-10-CM

## 2022-08-26 LAB — COMPLETE METABOLIC PANEL WITH GFR
AG Ratio: 1.4 (calc) (ref 1.0–2.5)
ALT: 17 U/L (ref 6–29)
AST: 15 U/L (ref 10–30)
Albumin: 3.9 g/dL (ref 3.6–5.1)
Alkaline phosphatase (APISO): 35 U/L (ref 31–125)
BUN: 14 mg/dL (ref 7–25)
CO2: 23 mmol/L (ref 20–32)
Calcium: 9.1 mg/dL (ref 8.6–10.2)
Chloride: 109 mmol/L (ref 98–110)
Creat: 0.87 mg/dL (ref 0.50–0.99)
Globulin: 2.7 g/dL (calc) (ref 1.9–3.7)
Glucose, Bld: 99 mg/dL (ref 65–99)
Potassium: 3.7 mmol/L (ref 3.5–5.3)
Sodium: 139 mmol/L (ref 135–146)
Total Bilirubin: 0.3 mg/dL (ref 0.2–1.2)
Total Protein: 6.6 g/dL (ref 6.1–8.1)
eGFR: 85 mL/min/{1.73_m2} (ref >=60)

## 2022-08-26 LAB — CBC
HCT: 37.1 % (ref 35.0–45.0)
Hemoglobin: 12.2 g/dL (ref 11.7–15.5)
MCH: 30 pg (ref 27.0–33.0)
MCHC: 32.9 g/dL (ref 32.0–36.0)
MCV: 91.2 fL (ref 80.0–100.0)
MPV: 10.8 fL (ref 7.5–12.5)
Platelets: 290 10*3/uL (ref 140–400)
RBC: 4.07 10*6/uL (ref 3.80–5.10)
RDW: 12.8 % (ref 11.0–15.0)
WBC: 7.7 10*3/uL (ref 3.8–10.8)
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
## 2022-08-26 NOTE — Progress Notes (Signed)
° ° ° ° ° ° ° ° ° ° ° ° ° ° °  Mykira Hofmeister, LCMHC °

## 2022-08-27 NOTE — Progress Notes (Signed)
Your lab work is within acceptable range and there are no concerning findings.   ?

## 2022-10-07 ENCOUNTER — Ambulatory Visit (INDEPENDENT_AMBULATORY_CARE_PROVIDER_SITE_OTHER): Payer: 59 | Admitting: Professional

## 2022-10-07 ENCOUNTER — Encounter: Payer: Self-pay | Admitting: Professional

## 2022-10-07 DIAGNOSIS — F330 Major depressive disorder, recurrent, mild: Secondary | ICD-10-CM | POA: Diagnosis not present

## 2022-10-07 NOTE — Progress Notes (Signed)
[facility] Counselor/Therapist Progress Note  Patient ID: Connie Carlson, MRN: [id number],    Date: 10/07/2022  Time Spent: 53 minutes [id number]   Treatment Type: Individual Therapy  Risk Assessment: Danger to Self:  No Self-injurious Behavior: No Danger to Others: No  Subjective: The patient arrived on time for her in-person appointment.  Issues addressed: 1-professional -pt feeling dissatisfied with the number of responsible she has at work -because she is good at her job she is the person everyone goes to -pt is expected to be available to people and is not awarded time off as others are -pt feels burned out -pt likes her job and is very happy with the benefits and the pay -discussed potential to locate another job 2-relationship issues -pt does not enjoy her relationship with her partner of eight years -pt is responsible for all the household upkeep, cooking, his children, and grocery shopping 3-treatment planning -pt and Clinician created patient's treatment plan -pt fully participated and agrees with her treatment plan  Treatment Plan Problems Addressed  Career Success Obstacles, Depression, Low Self-Esteem/Lack of Assertiveness, Partner Relational Problems  Goals 1. Develop a sense of own personal power and self-esteem within the relationship. 2. Develop advocacy and empowerment skills that involve taking a proactive stance in the work environment. 3. Develop assertiveness skills and techniques for application in work-related activities (e.g., establishing boundaries with coworkers, Armed forces training and education officer). 4. Develop healthy cognitive mechanisms to facilitate positive attitudes and beliefs about self within the context of one's environment to mitigate depressive symptoms. Objective Articulate signs and symptoms of depression in current life experiences. Target Date: 2023-10-07 Frequency: Biweekly  Progress: 0 Modality: individual  Related  Interventions Encourage the client to share her feelings of depression to gain an insight into precipitating events and implications of symptoms; normalize her feelings of depression. Objective Implement behavioral interventions to overcome depression. Target Date: 2023-10-07 Frequency: Biweekly  Progress: 0 Modality: individual  Related Interventions Assign the client to participate as fully as possible in a healthy exercise regimen. Encourage the client to continue participation in positive social support systems. Objective Identify and replace cognitive self-talk that supports depression. Target Date: 2023-10-07 Frequency: Biweekly  Progress: 0 Modality: individual  Related Interventions Reinforce the client's positive, reality-based cognitive messages that enhance self-confidence and increase adaptive action (see "Positive Self-Talk" in the Adult Psychotherapy Homework Planner, 2nd ed. by Bryn Gulling). Do "behavioral experiments" in which depressive automatic thoughts are treated as hypotheses/predictions, reality-based alternative hypotheses/ predictions are generated, and both are tested against the client's past, present, and/or future experiences. Encourage the client to discuss cognitive distortions, including automatic thoughts (e.g., negative view of self, future, experience) and negative schemas (e.g., core beliefs about self and others based on earlier childhood experiences); assess frequency of negative self-statements associated with depression. Objective Increase the frequency of engaging in pleasant activities. Target Date: 2023-10-07 Frequency: Biweekly  Progress: 0 Modality: individual  Related Interventions Assign the client reading materials regarding overcoming depression for women (e.g., Women and Depression: A Practical Self-Help Guide by Baird Cancer; Feeling Good by Quay Burow; Women & Depression by Aubery Lapping; Silencing the Self: Women and Depression by Barnabas Lister). Objective Identify  precipitating events and factors of depression, particularly roles of self and others in depression. Target Date: 2023-10-07 Frequency: Biweekly  Progress: 0 Modality: individual  Related Interventions Explore the degree to which primary symptoms of depression are a result of gender role socialization (e.g., passivity, decreased self-esteem, learned helplessness, interpersonal orientation towards pleasing others). Evaluate symptoms to consider the presence of  culture-bound syndromes and/or culture specificity of symptoms (e.g., ataques de nervios, amok, postemigration effects). Objective Increase the level of physical exercise. Target Date: 2023-10-07 Frequency: Biweekly  Progress: 0 Modality: individual  Objective Increase social contacts and communicate needs within existing interpersonal relationships. Target Date: 2023-10-07 Frequency: Biweekly  Progress: 0 Modality: individual  5. Explore career options that have been automatically ruled out due to low self-concept or limited opportunities. Objective Identify and replace at least three negative self-talk statements that perpetuate feelings of powerlessness or hopelessness. Target Date: 2023-10-07 Frequency: Biweekly  Progress: 0 Modality: individual  Objective Identify and replace cognitive self-talk that contributes to all-or-nothing thinking, gender-role stereotypes, or low career self-efficacy. Target Date: 2023-10-07 Frequency: Biweekly  Progress: 0 Modality: individual  Objective Develop career-related strategies to overcome and persist in the face of obstacles. Target Date: 2023-10-07 Frequency: Biweekly  Progress: 0 Modality: individual  Objective Increase self-esteem and self-concept by exploring and encompassing other roles/identities besides those of career. Target Date: 2023-10-07 Frequency: Biweekly  Progress: 0 Modality: individual  Objective Identify barriers and personal benefits to balancing career and family  life. Target Date: 2023-10-07 Frequency: Biweekly  Progress: 0 Modality: individual  6. Identify and increase intrapersonal, interpersonal, and physical resources to foster positive coping strategies. 7. Improve depressed mood to maximize effective social, occupational, and physical functioning. 8. Improve self-esteem and develop a positive self-image as capable and competent. 9. Increase ability to express needs and desires openly and honestly. 10. Increase assertiveness skills and ability to advocate for self. 11. Increase awareness of own role in relationship conflicts. Objective Identify a pattern of repeatedly forming destructive intimate relationships. Target Date: 2023-10-07 Frequency: Biweekly  Progress: 0 Modality: individual  12. Increase involvement in activities that foster confidence and a sense of accomplishment. Objective Acknowledge self-disparaging statements and recognize the tendency to engage in such statements. Target Date: 2023-10-07 Frequency: Biweekly  Progress: 0 Modality: individual  Related Interventions Ask the client to complete and process an exercise in the book Ten Days to Self-Esteem! Quay Burow). Objective Verbalize an understanding of the differences between passive, assertive, and aggressive behavior. Target Date: 2023-10-07 Frequency: Biweekly  Progress: 0 Modality: individual  Objective Describe personal history of self-devaluation and lack of assertiveness. Target Date: 2023-10-07 Frequency: Biweekly  Progress: 0 Modality: individual  Related Interventions Use cognitive-restructuring techniques to change the client's belief system (i.e., via reality-testing and rational thought) toward a more positive, realistic self-perception. Assist the client in becoming aware of how she indirectly expresses negative feelings about self (e.g., lack of eye contact, social withdrawal, sweating, expectations of failure or rejection). Assist the client in identifying  negative beliefs about herself. Discuss context of self-disparagement, including client's perceptions of self from childhood to present, role of caregivers and peers, and related experiences. Objective Identify three roadblocks to improved self-esteem. Target Date: 2023-10-07 Frequency: Biweekly  Progress: 0 Modality: individual  Objective Practice saying "no" to at least one person, declining to comply with a request/favor or identify and assert rights, needs, and wants. Target Date: 2023-10-07 Frequency: Biweekly  Progress: 0 Modality: individual  Objective Implement at least one decision based on own beliefs and values. Target Date: 2023-10-07 Frequency: Biweekly  Progress: 0 Modality: individual  13. Learn and implement various coping skills for dealing with work- and career-related inequalities. 14. Reduce self-disparaging remarks and negative self-talk.   Diagnosis:Major depressive disorder, recurrent episode, mild ([place])  Plan:  -meet again on Tuesday, October 12, 2022 at 10am virtual.

## 2022-10-07 NOTE — Progress Notes (Signed)
                Connie Carlson, LCMHC °

## 2022-10-12 ENCOUNTER — Encounter: Payer: Self-pay | Admitting: Professional

## 2022-10-12 ENCOUNTER — Ambulatory Visit (INDEPENDENT_AMBULATORY_CARE_PROVIDER_SITE_OTHER): Payer: 59 | Admitting: Professional

## 2022-10-12 DIAGNOSIS — F330 Major depressive disorder, recurrent, mild: Secondary | ICD-10-CM | POA: Diagnosis not present
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
## 2022-10-21 ENCOUNTER — Ambulatory Visit (INDEPENDENT_AMBULATORY_CARE_PROVIDER_SITE_OTHER): Payer: 59 | Admitting: Professional

## 2022-10-21 ENCOUNTER — Encounter: Payer: Self-pay | Admitting: Professional

## 2022-10-21 DIAGNOSIS — F330 Major depressive disorder, recurrent, mild: Secondary | ICD-10-CM
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
## 2022-10-25 ENCOUNTER — Encounter: Payer: Self-pay | Admitting: Obstetrics & Gynecology

## 2022-10-25 ENCOUNTER — Ambulatory Visit: Payer: 59 | Admitting: Obstetrics & Gynecology

## 2022-10-25 VITALS — BP 124/81 | HR 78 | Ht 61.0 in | Wt 199.0 lb

## 2022-10-25 DIAGNOSIS — N939 Abnormal uterine and vaginal bleeding, unspecified: Secondary | ICD-10-CM

## 2022-10-25 NOTE — Progress Notes (Signed)
   Subjective:    Patient ID: Connie Carlson, female    DOB: 01/06/1981, 42 y.o.   MRN: [id number]  HPI  42 yo female presents for [place] results.  Hyperechoic focus in uterine fundus has resolved.  She is not on any birth control at this time.  Her periods come once a month with some midcycle spotting.   Tests reviewed: Embx 2 pelvic ultrasounds.  Review of Systems  Constitutional: Negative.   Respiratory: Negative.    Cardiovascular: Negative.   Gastrointestinal: Negative.   Genitourinary: Negative.       Objective:   Physical Exam Vitals reviewed.  Constitutional:      General: She is not in acute distress.    Appearance: She is well-developed.  HENT:     Head: Normocephalic and atraumatic.  Eyes:     Conjunctiva/sclera: Conjunctivae normal.  Cardiovascular:     Rate and Rhythm: Normal rate.  Pulmonary:     Effort: Pulmonary effort is normal.  Skin:    General: Skin is warm and dry.  Neurological:     Mental Status: She is alert and oriented to person, place, and time.  Psychiatric:        Mood and Affect: Mood normal.    Vitals:   10/25/22 1541  BP: 124/81  Pulse: 78  Weight: 199 lb (90.3 kg)  Height: '5\' 1"'$  (1.549 m)       Assessment & Plan:  42 yo female with history of menomet for f/u   Bx nml US--hyperechoic lesion at fundus resolved. Menomet all but resolved off of OCPs.  Some mid cycle spotting.  She does not desire to be on OCPs or other medications. Keep menstrual diary and return to clinic if bleeding worsens of she has concerns.

## 2022-10-26 ENCOUNTER — Ambulatory Visit: Payer: 59 | Admitting: Professional

## 2022-11-02 ENCOUNTER — Encounter: Payer: Self-pay | Admitting: Professional

## 2022-11-02 ENCOUNTER — Ambulatory Visit (INDEPENDENT_AMBULATORY_CARE_PROVIDER_SITE_OTHER): Payer: 59 | Admitting: Professional

## 2022-11-02 DIAGNOSIS — F330 Major depressive disorder, recurrent, mild: Secondary | ICD-10-CM | POA: Diagnosis not present

## 2022-11-02 DIAGNOSIS — F4322 Adjustment disorder with anxiety: Secondary | ICD-10-CM

## 2022-11-02 NOTE — Progress Notes (Signed)
[place] Counselor/Therapist Progress Note  Patient ID: Connie Carlson, MRN: [id number],    Date: 11/02/2022  Time Spent: 64 minutes 10-1104am   Treatment Type: Individual Therapy  Risk Assessment: Danger to Self:  No Self-injurious Behavior: No Danger to Others: No  Subjective: This session was held via video teletherapy. The patient consented to video teletherapy and was located in her home during this session. She is aware it is the responsibility of the patient to secure confidentiality on her end of the session. The provider was in a private home office for the duration of this session.    The patient arrived on time for her webex appointment.  Issues addressed: 1-homework- did not get book and plans to get today -read Boundaries book, have one chapter completed by next visit. 2-physical a-pt had strep throat and congestion and was off work for several days b-she felt poorly and it was worse than when she had Covid c-has her [facility] to [employer] but because she was sick last week she did not go -she plans to go today 3-professional a-first day back was yesterday b-she set a boundary at work about her schedule due to a return medical appointment c-her boss asked her to change it and patient told her she could not -pt felt good about that and her boss received it well 4-personal a-she has been setting limits with him around tasks that need completed -he has been responding well to her requests -she knows that he will return to his old behaviors b-his country is even more female-dominant and controlling -the women do everything c-pt shared that her son is closed to her when he was 36 that he liked boys -it has taken her years to accept it -she worries about him being taken advantage of -she has only received positive remarks about how kind he is -she wishes he was tough and not so gentle  Treatment Plan Problems Addressed  Career Success Obstacles, Depression,  Low Self-Esteem/Lack of Assertiveness, Partner Relational Problems  Goals 1. Develop a sense of own personal power and self-esteem within the relationship. 2. Develop advocacy and empowerment skills that involve taking a proactive stance in the work environment. 3. Develop assertiveness skills and techniques for application in work-related activities (e.g., establishing boundaries with coworkers, Armed forces training and education officer). 4. Develop healthy cognitive mechanisms to facilitate positive attitudes and beliefs about self within the context of one's environment to mitigate depressive symptoms. Objective Articulate signs and symptoms of depression in current life experiences. Target Date: 2023-10-07 Frequency: Biweekly  Progress: 0 Modality: individual  Related Interventions Encourage the client to share her feelings of depression to gain an insight into precipitating events and implications of symptoms; normalize her feelings of depression. Objective Implement behavioral interventions to overcome depression. Target Date: 2023-10-07 Frequency: Biweekly  Progress: 0 Modality: individual  Related Interventions Assign the client to participate as fully as possible in a healthy exercise regimen. Encourage the client to continue participation in positive social support systems. Objective Identify and replace cognitive self-talk that supports depression. Target Date: 2023-10-07 Frequency: Biweekly  Progress: 0 Modality: individual  Related Interventions Reinforce the client's positive, reality-based cognitive messages that enhance self-confidence and increase adaptive action (see "Positive Self-Talk" in the Adult Psychotherapy Homework Planner, 2nd ed. by Bryn Gulling). Do "behavioral experiments" in which depressive automatic thoughts are treated as hypotheses/predictions, reality-based alternative hypotheses/ predictions are generated, and both are tested against the client's past, present, and/or future  experiences. Encourage the client to discuss cognitive distortions, including automatic  thoughts (e.g., negative view of self, future, experience) and negative schemas (e.g., core beliefs about self and others based on earlier childhood experiences); assess frequency of negative self-statements associated with depression. Objective Increase the frequency of engaging in pleasant activities. Target Date: 2023-10-07 Frequency: Biweekly  Progress: 0 Modality: individual  Related Interventions Assign the client reading materials regarding overcoming depression for women (e.g., Women and Depression: A Practical Self-Help Guide by Baird Cancer; Feeling Good by Quay Burow; Women & Depression by Aubery Lapping; Silencing the Self: Women and Depression by Barnabas Lister). Objective Identify precipitating events and factors of depression, particularly roles of self and others in depression. Target Date: 2023-10-07 Frequency: Biweekly  Progress: 0 Modality: individual  Related Interventions Explore the degree to which primary symptoms of depression are a result of gender role socialization (e.g., passivity, decreased self-esteem, learned helplessness, interpersonal orientation towards pleasing others). Evaluate symptoms to consider the presence of culture-bound syndromes and/or culture specificity of symptoms (e.g., ataques de nervios, amok, postemigration effects). Objective Increase the level of physical exercise. Target Date: 2023-10-07 Frequency: Biweekly  Progress: 0 Modality: individual  Objective Increase social contacts and communicate needs within existing interpersonal relationships. Target Date: 2023-10-07 Frequency: Biweekly  Progress: 0 Modality: individual  5. Explore career options that have been automatically ruled out due to low self-concept or limited opportunities. Objective Identify and replace at least three negative self-talk statements that perpetuate feelings of powerlessness or hopelessness. Target  Date: 2023-10-07 Frequency: Biweekly  Progress: 0 Modality: individual  Objective Identify and replace cognitive self-talk that contributes to all-or-nothing thinking, gender-role stereotypes, or low career self-efficacy. Target Date: 2023-10-07 Frequency: Biweekly  Progress: 0 Modality: individual  Objective Develop career-related strategies to overcome and persist in the face of obstacles. Target Date: 2023-10-07 Frequency: Biweekly  Progress: 0 Modality: individual  Objective Increase self-esteem and self-concept by exploring and encompassing other roles/identities besides those of career. Target Date: 2023-10-07 Frequency: Biweekly  Progress: 0 Modality: individual  Objective Identify barriers and personal benefits to balancing career and family life. Target Date: 2023-10-07 Frequency: Biweekly  Progress: 0 Modality: individual  6. Identify and increase intrapersonal, interpersonal, and physical resources to foster positive coping strategies. 7. Improve depressed mood to maximize effective social, occupational, and physical functioning. 8. Improve self-esteem and develop a positive self-image as capable and competent. 9. Increase ability to express needs and desires openly and honestly. 10. Increase assertiveness skills and ability to advocate for self. 11. Increase awareness of own role in relationship conflicts. Objective Identify a pattern of repeatedly forming destructive intimate relationships. Target Date: 2023-10-07 Frequency: Biweekly  Progress: 0 Modality: individual  12. Increase involvement in activities that foster confidence and a sense of accomplishment. Objective Acknowledge self-disparaging statements and recognize the tendency to engage in such statements. Target Date: 2023-10-07 Frequency: Biweekly  Progress: 0 Modality: individual  Related Interventions Ask the client to complete and process an exercise in the book Ten Days to Self-Esteem!  Quay Burow). Objective Verbalize an understanding of the differences between passive, assertive, and aggressive behavior. Target Date: 2023-10-07 Frequency: Biweekly  Progress: 0 Modality: individual  Objective Describe personal history of self-devaluation and lack of assertiveness. Target Date: 2023-10-07 Frequency: Biweekly  Progress: 0 Modality: individual  Related Interventions Use cognitive-restructuring techniques to change the client's belief system (i.e., via reality-testing and rational thought) toward a more positive, realistic self-perception. Assist the client in becoming aware of how she indirectly expresses negative feelings about self (e.g., lack of eye contact, social withdrawal, sweating, expectations of failure or rejection). Assist the client in  identifying negative beliefs about herself. Discuss context of self-disparagement, including client's perceptions of self from childhood to present, role of caregivers and peers, and related experiences. Objective Identify three roadblocks to improved self-esteem. Target Date: 2023-10-07 Frequency: Biweekly  Progress: 0 Modality: individual  Objective Practice saying "no" to at least one person, declining to comply with a request/favor or identify and assert rights, needs, and wants. Target Date: 2023-10-07 Frequency: Biweekly  Progress: 0 Modality: individual  Objective Implement at least one decision based on own beliefs and values. Target Date: 2023-10-07 Frequency: Biweekly  Progress: 0 Modality: individual  13. Learn and implement various coping skills for dealing with work- and career-related inequalities. 14. Reduce self-disparaging remarks and negative self-talk.   Diagnosis:Major depressive disorder, recurrent episode, mild ([place])  Adjustment disorder with anxiety  Plan:   -meet again on Monday, October 25, 2022 at 8am virtual.

## 2022-11-04 ENCOUNTER — Ambulatory Visit: Payer: 59 | Admitting: Professional

## 2022-11-09 ENCOUNTER — Ambulatory Visit: Payer: 59 | Admitting: Professional

## 2022-11-17 ENCOUNTER — Other Ambulatory Visit: Payer: Self-pay | Admitting: *Deleted

## 2022-11-17 DIAGNOSIS — Z30011 Encounter for initial prescription of contraceptive pills: Secondary | ICD-10-CM

## 2022-11-17 MED ORDER — LEVONORGEST-ETH ESTRAD 91-DAY 0.15-0.03 &0.01 MG PO TABS: 1.0000 | ORAL_TABLET | Freq: Every day | ORAL | 3 refills | Status: DC

## 2022-11-18 ENCOUNTER — Ambulatory Visit (INDEPENDENT_AMBULATORY_CARE_PROVIDER_SITE_OTHER): Payer: 59 | Admitting: Professional

## 2022-11-18 ENCOUNTER — Encounter: Payer: Self-pay | Admitting: Professional

## 2022-11-18 DIAGNOSIS — F4322 Adjustment disorder with anxiety: Secondary | ICD-10-CM | POA: Diagnosis not present

## 2022-11-18 DIAGNOSIS — F330 Major depressive disorder, recurrent, mild: Secondary | ICD-10-CM

## 2022-11-18 NOTE — Progress Notes (Signed)
[place] Counselor/Therapist Progress Note  Patient ID: Connie Carlson, MRN: [id number],    Date: 11/18/2022  Time Spent: 46 minutes [id number]   Treatment Type: Individual Therapy  Risk Assessment: Danger to Self:  No Self-injurious Behavior: No Danger to Others: No  Subjective: The patient arrived on time for her in-person session.  Issues addressed: 1-professional a-boss Lanny Hurst pur her corrective action for taking time off for her FMLA -she had FMLA intermittent for migraines -Lanny Hurst told her she can not take days off that back up to her regular days off -pt refuse to sign the write-up and went to HR -HR told the pt her boss was told he could not do that 4-personal a-continuing to go the the gym b-going out on weekends with coworker getting lunch after they go to the gym together c-bf doesn't notice anything different because the house is clean and there is good prepared d-his 84 year old daughter Wells Guiles is a HS senior, she has known her since she was 30 -she has decided to go to cosmetology school causing pt's bf to be angry -pt's bf is told her he will not support her and she will be paying into the bills -his daughter has refused to come back to visit and has moved in with her mother -last Sunday he took things she left behind and took it to [employer] e-her only interaction with him was a text regarding him getting her a car -he said she needed to give him 1K and she let him know she has the money f-pt has been in contact with [name] yesterday -she knows that [name] is angry with her because she stayed  Treatment Plan Problems Addressed  Career Success Obstacles, Depression, Low Self-Esteem/Lack of Assertiveness, Partner Relational Problems  Goals 1. Develop a sense of own personal power and self-esteem within the relationship. 2. Develop advocacy and empowerment skills that involve taking a proactive stance in the work environment. 3. Develop assertiveness  skills and techniques for application in work-related activities (e.g., establishing boundaries with coworkers, Armed forces training and education officer). 4. Develop healthy cognitive mechanisms to facilitate positive attitudes and beliefs about self within the context of one's environment to mitigate depressive symptoms. Objective Articulate signs and symptoms of depression in current life experiences. Target Date: 2023-10-07 Frequency: Biweekly  Progress: 0 Modality: individual  Related Interventions Encourage the client to share her feelings of depression to gain an insight into precipitating events and implications of symptoms; normalize her feelings of depression. Objective Implement behavioral interventions to overcome depression. Target Date: 2023-10-07 Frequency: Biweekly  Progress: 0 Modality: individual  Related Interventions Assign the client to participate as fully as possible in a healthy exercise regimen. Encourage the client to continue participation in positive social support systems. Objective Identify and replace cognitive self-talk that supports depression. Target Date: 2023-10-07 Frequency: Biweekly  Progress: 0 Modality: individual  Related Interventions Reinforce the client's positive, reality-based cognitive messages that enhance self-confidence and increase adaptive action (see "Positive Self-Talk" in the Adult Psychotherapy Homework Planner, 2nd ed. by Bryn Gulling). Do "behavioral experiments" in which depressive automatic thoughts are treated as hypotheses/predictions, reality-based alternative hypotheses/ predictions are generated, and both are tested against the client's past, present, and/or future experiences. Encourage the client to discuss cognitive distortions, including automatic thoughts (e.g., negative view of self, future, experience) and negative schemas (e.g., core beliefs about self and others based on earlier childhood experiences); assess frequency of negative self-statements  associated with depression. Objective Increase the frequency of engaging in pleasant activities. Target  Date: 2023-10-07 Frequency: Biweekly  Progress: 0 Modality: individual  Related Interventions Assign the client reading materials regarding overcoming depression for women (e.g., Women and Depression: A Practical Self-Help Guide by Baird Cancer; Feeling Good by Quay Burow; Women & Depression by Aubery Lapping; Silencing the Self: Women and Depression by Barnabas Lister). Objective Identify precipitating events and factors of depression, particularly roles of self and others in depression. Target Date: 2023-10-07 Frequency: Biweekly  Progress: 0 Modality: individual  Related Interventions Explore the degree to which primary symptoms of depression are a result of gender role socialization (e.g., passivity, decreased self-esteem, learned helplessness, interpersonal orientation towards pleasing others). Evaluate symptoms to consider the presence of culture-bound syndromes and/or culture specificity of symptoms (e.g., ataques de nervios, amok, postemigration effects). Objective Increase the level of physical exercise. Target Date: 2023-10-07 Frequency: Biweekly  Progress: 0 Modality: individual  Objective Increase social contacts and communicate needs within existing interpersonal relationships. Target Date: 2023-10-07 Frequency: Biweekly  Progress: 0 Modality: individual  5. Explore career options that have been automatically ruled out due to low self-concept or limited opportunities. Objective Identify and replace at least three negative self-talk statements that perpetuate feelings of powerlessness or hopelessness. Target Date: 2023-10-07 Frequency: Biweekly  Progress: 0 Modality: individual  Objective Identify and replace cognitive self-talk that contributes to all-or-nothing thinking, gender-role stereotypes, or low career self-efficacy. Target Date: 2023-10-07 Frequency: Biweekly  Progress: 0 Modality:  individual  Objective Develop career-related strategies to overcome and persist in the face of obstacles. Target Date: 2023-10-07 Frequency: Biweekly  Progress: 0 Modality: individual  Objective Increase self-esteem and self-concept by exploring and encompassing other roles/identities besides those of career. Target Date: 2023-10-07 Frequency: Biweekly  Progress: 0 Modality: individual  Objective Identify barriers and personal benefits to balancing career and family life. Target Date: 2023-10-07 Frequency: Biweekly  Progress: 0 Modality: individual  6. Identify and increase intrapersonal, interpersonal, and physical resources to foster positive coping strategies. 7. Improve depressed mood to maximize effective social, occupational, and physical functioning. 8. Improve self-esteem and develop a positive self-image as capable and competent. 9. Increase ability to express needs and desires openly and honestly. 10. Increase assertiveness skills and ability to advocate for self. 11. Increase awareness of own role in relationship conflicts. Objective Identify a pattern of repeatedly forming destructive intimate relationships. Target Date: 2023-10-07 Frequency: Biweekly  Progress: 0 Modality: individual  12. Increase involvement in activities that foster confidence and a sense of accomplishment. Objective Acknowledge self-disparaging statements and recognize the tendency to engage in such statements. Target Date: 2023-10-07 Frequency: Biweekly  Progress: 0 Modality: individual  Related Interventions Ask the client to complete and process an exercise in the book Ten Days to Self-Esteem! Quay Burow). Objective Verbalize an understanding of the differences between passive, assertive, and aggressive behavior. Target Date: 2023-10-07 Frequency: Biweekly  Progress: 0 Modality: individual  Objective Describe personal history of self-devaluation and lack of assertiveness. Target Date: 2023-10-07  Frequency: Biweekly  Progress: 0 Modality: individual  Related Interventions Use cognitive-restructuring techniques to change the client's belief system (i.e., via reality-testing and rational thought) toward a more positive, realistic self-perception. Assist the client in becoming aware of how she indirectly expresses negative feelings about self (e.g., lack of eye contact, social withdrawal, sweating, expectations of failure or rejection). Assist the client in identifying negative beliefs about herself. Discuss context of self-disparagement, including client's perceptions of self from childhood to present, role of caregivers and peers, and related experiences. Objective Identify three roadblocks to improved self-esteem. Target Date: 2023-10-07 Frequency: Biweekly  Progress: 0  Modality: individual  Objective Practice saying "no" to at least one person, declining to comply with a request/favor or identify and assert rights, needs, and wants. Target Date: 2023-10-07 Frequency: Biweekly  Progress: 0 Modality: individual  Objective Implement at least one decision based on own beliefs and values. Target Date: 2023-10-07 Frequency: Biweekly  Progress: 0 Modality: individual  13. Learn and implement various coping skills for dealing with work- and career-related inequalities. 14. Reduce self-disparaging remarks and negative self-talk.   Diagnosis:Major depressive disorder, recurrent episode, mild ([place])  Adjustment disorder with anxiety  Plan: -taking more time for herself -meet again on Friday, November 26, 2022 at 8am in person

## 2022-11-25 ENCOUNTER — Ambulatory Visit: Payer: 59 | Admitting: Professional

## 2022-11-26 ENCOUNTER — Ambulatory Visit (INDEPENDENT_AMBULATORY_CARE_PROVIDER_SITE_OTHER): Payer: 59 | Admitting: Professional

## 2022-11-26 ENCOUNTER — Encounter: Payer: Self-pay | Admitting: Professional

## 2022-11-26 DIAGNOSIS — F330 Major depressive disorder, recurrent, mild: Secondary | ICD-10-CM | POA: Diagnosis not present

## 2022-11-26 DIAGNOSIS — F4322 Adjustment disorder with anxiety: Secondary | ICD-10-CM

## 2022-11-26 NOTE — Progress Notes (Signed)
[facility] Counselor/Therapist Progress Note  Patient ID: Connie Carlson, MRN: [id number],    Date: 11/26/2022  Time Spent: 50 minutes [id number]   Treatment Type: Individual Therapy  Risk Assessment: Danger to Self:  No Self-injurious Behavior: No Danger to Others: No  Subjective: The patient arrived late for her in-person session.  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 c-she completed her [place] for financial aid 2-son Daniel's boyfriend coming from [employer] a-they have been a couple for one year b-pt feels happy for her son c-late visit it was a feeling of having to understand her own feelings d-pt has always told her son that his sexuality is not for the world to know -pt has fear for her son, "I'm afraid all the time" -pt has grieved her son's sexuality 3-partner Jovanni -critical of [place] sexual preference 4-Daniel's father Truddie Coco ([place]) -blaming son's father that they never had another child after they got divorced -she did not want to have a child with different fathers -she notices she is mad that he had another child and she did not   -she blamed him for her decision when she saw a picture of his child -she feels regretful -e was nine years older and was like a father -he was helping her to row and not for his on benefit -pt regrets having left her marriage  Treatment Plan Problems Addressed  Career Success Obstacles, Depression, Low Self-Esteem/Lack of Assertiveness, Partner Relational Problems  Goals 1. Develop a sense of own personal power and self-esteem within the relationship. 2. Develop advocacy and empowerment skills that involve taking a proactive stance in the work environment. 3. Develop assertiveness skills and techniques for application in work-related activities (e.g., establishing  boundaries with coworkers, Armed forces training and education officer). 4. Develop healthy cognitive mechanisms to facilitate positive attitudes and beliefs about self within the context of one's environment to mitigate depressive symptoms. Objective Articulate signs and symptoms of depression in current life experiences. Target Date: 2023-10-07 Frequency: Biweekly  Progress: 0 Modality: individual  Related Interventions Encourage the client to share her feelings of depression to gain an insight into precipitating events and implications of symptoms; normalize her feelings of depression. Objective Implement behavioral interventions to overcome depression. Target Date: 2023-10-07 Frequency: Biweekly  Progress: 0 Modality: individual  Related Interventions Assign the client to participate as fully as possible in a healthy exercise regimen. Encourage the client to continue participation in positive social support systems. Objective Identify and replace cognitive self-talk that supports depression. Target Date: 2023-10-07 Frequency: Biweekly  Progress: 0 Modality: individual  Related Interventions Reinforce the client's positive, reality-based cognitive messages that enhance self-confidence and increase adaptive action (see "Positive Self-Talk" in the Adult Psychotherapy Homework Planner, 2nd ed. by Bryn Gulling). Do "behavioral experiments" in which depressive automatic thoughts are treated as hypotheses/predictions, reality-based alternative hypotheses/ predictions are generated, and both are tested against the client's past, present, and/or future experiences. Encourage the client to discuss cognitive distortions, including automatic thoughts (e.g., negative view of self, future, experience) and negative schemas (e.g., core beliefs about self and others based on earlier childhood experiences); assess frequency of negative self-statements associated with depression. Objective Increase the frequency of engaging in pleasant  activities. Target Date: 2023-10-07 Frequency: Biweekly  Progress: 0 Modality: individual  Related Interventions Assign the client reading materials regarding overcoming depression for women (e.g., Women and Depression: A Practical Self-Help Guide by  Baird Cancer; Feeling Good by Quay Burow; Women & Depression by Aubery Lapping; Silencing the Self: Women and Depression by Barnabas Lister). Objective Identify precipitating events and factors of depression, particularly roles of self and others in depression. Target Date: 2023-10-07 Frequency: Biweekly  Progress: 0 Modality: individual  Related Interventions Explore the degree to which primary symptoms of depression are a result of gender role socialization (e.g., passivity, decreased self-esteem, learned helplessness, interpersonal orientation towards pleasing others). Evaluate symptoms to consider the presence of culture-bound syndromes and/or culture specificity of symptoms (e.g., ataques de nervios, amok, postemigration effects). Objective Increase the level of physical exercise. Target Date: 2023-10-07 Frequency: Biweekly  Progress: 0 Modality: individual  Objective Increase social contacts and communicate needs within existing interpersonal relationships. Target Date: 2023-10-07 Frequency: Biweekly  Progress: 0 Modality: individual  5. Explore career options that have been automatically ruled out due to low self-concept or limited opportunities. Objective Identify and replace at least three negative self-talk statements that perpetuate feelings of powerlessness or hopelessness. Target Date: 2023-10-07 Frequency: Biweekly  Progress: 0 Modality: individual  Objective Identify and replace cognitive self-talk that contributes to all-or-nothing thinking, gender-role stereotypes, or low career self-efficacy. Target Date: 2023-10-07 Frequency: Biweekly  Progress: 0 Modality: individual  Objective Develop career-related strategies to overcome and persist in the face  of obstacles. Target Date: 2023-10-07 Frequency: Biweekly  Progress: 0 Modality: individual  Objective Increase self-esteem and self-concept by exploring and encompassing other roles/identities besides those of career. Target Date: 2023-10-07 Frequency: Biweekly  Progress: 0 Modality: individual  Objective Identify barriers and personal benefits to balancing career and family life. Target Date: 2023-10-07 Frequency: Biweekly  Progress: 0 Modality: individual  6. Identify and increase intrapersonal, interpersonal, and physical resources to foster positive coping strategies. 7. Improve depressed mood to maximize effective social, occupational, and physical functioning. 8. Improve self-esteem and develop a positive self-image as capable and competent. 9. Increase ability to express needs and desires openly and honestly. 10. Increase assertiveness skills and ability to advocate for self. 11. Increase awareness of own role in relationship conflicts. Objective Identify a pattern of repeatedly forming destructive intimate relationships. Target Date: 2023-10-07 Frequency: Biweekly  Progress: 0 Modality: individual  12. Increase involvement in activities that foster confidence and a sense of accomplishment. Objective Acknowledge self-disparaging statements and recognize the tendency to engage in such statements. Target Date: 2023-10-07 Frequency: Biweekly  Progress: 0 Modality: individual  Related Interventions Ask the client to complete and process an exercise in the book Ten Days to Self-Esteem! Quay Burow). Objective Verbalize an understanding of the differences between passive, assertive, and aggressive behavior. Target Date: 2023-10-07 Frequency: Biweekly  Progress: 0 Modality: individual  Objective Describe personal history of self-devaluation and lack of assertiveness. Target Date: 2023-10-07 Frequency: Biweekly  Progress: 0 Modality: individual  Related Interventions Use  cognitive-restructuring techniques to change the client's belief system (i.e., via reality-testing and rational thought) toward a more positive, realistic self-perception. Assist the client in becoming aware of how she indirectly expresses negative feelings about self (e.g., lack of eye contact, social withdrawal, sweating, expectations of failure or rejection). Assist the client in identifying negative beliefs about herself. Discuss context of self-disparagement, including client's perceptions of self from childhood to present, role of caregivers and peers, and related experiences. Objective Identify three roadblocks to improved self-esteem. Target Date: 2023-10-07 Frequency: Biweekly  Progress: 0 Modality: individual  Objective Practice saying "no" to at least one person, declining to comply with a request/favor or identify and assert rights, needs, and wants. Target Date: 2023-10-07 Frequency: Biweekly  Progress: 0 Modality: individual  Objective Implement at least one decision based on own beliefs and values. Target Date: 2023-10-07 Frequency: Biweekly  Progress: 0 Modality: individual  13. Learn and implement various coping skills for dealing with work- and career-related inequalities. 14. Reduce self-disparaging remarks and negative self-talk.   Diagnosis:Major depressive disorder, recurrent episode, mild ([place])  Adjustment disorder with anxiety  Plan: -taking more time for herself -meet again on Friday, November 26, 2022 at 8am in person

## 2022-11-30 ENCOUNTER — Ambulatory Visit: Payer: 59 | Admitting: Professional

## 2022-12-03 ENCOUNTER — Ambulatory Visit: Payer: 59 | Admitting: Professional

## 2022-12-09 ENCOUNTER — Ambulatory Visit (INDEPENDENT_AMBULATORY_CARE_PROVIDER_SITE_OTHER): Payer: 59 | Admitting: Professional

## 2022-12-09 ENCOUNTER — Encounter: Payer: Self-pay | Admitting: Professional

## 2022-12-09 DIAGNOSIS — F330 Major depressive disorder, recurrent, mild: Secondary | ICD-10-CM | POA: Diagnosis not present

## 2022-12-09 DIAGNOSIS — F4322 Adjustment disorder with anxiety: Secondary | ICD-10-CM

## 2022-12-09 NOTE — Progress Notes (Signed)
[facility] Counselor/Therapist Progress Note  Patient ID: Connie Carlson, MRN: [id number],    Date: 12/09/2022  Time Spent: 40 minutes [id number]   Treatment Type: Individual Therapy  Risk Assessment: Danger to Self:  No Self-injurious Behavior: No Danger to Others: No  Subjective: The patient arrived late for her in-person session.  Issues addressed: 1-pt left relationship a-pt was fed up with her bf and was the final straw b-"I don't deserve to called a fucking bitch everyday" c-she was told that if she had anything to do with his daughter moving out that he will destroy her face d-pt said she was strong yesterday and packed up her things and left -she had so much fear in the morning -he was unaware because she did her same morning routine -when she left in the morning she felt good e-this morning she was awake by 5 and listened to a podcast -on Tuesday, her bf asked her to change something in his phone -pt discovered a female he was looking for on facebook -pt said he is always making comments about getting a woman from [place] or [place] that the women in [place] are not worth it -he can be so nice -"I need to get out of my head that someday he's going to do something or change" 2-coping -develop a schedule -decide what you want to do with your time -research opportunities to return to school -make time for family and friends  Treatment Plan Problems Addressed  Career Success Obstacles, Depression, Low Self-Esteem/Lack of Assertiveness, Partner Relational Problems  Goals 1. Develop a sense of own personal power and self-esteem within the relationship. 2. Develop advocacy and empowerment skills that involve taking a proactive stance in the work environment. 3. Develop assertiveness skills and techniques for application in work-related activities (e.g., establishing boundaries with coworkers, Armed forces training and education officer). 4. Develop healthy cognitive mechanisms to  facilitate positive attitudes and beliefs about self within the context of one's environment to mitigate depressive symptoms. Objective Articulate signs and symptoms of depression in current life experiences. Target Date: 2023-10-07 Frequency: Biweekly  Progress: 0 Modality: individual  Related Interventions Encourage the client to share her feelings of depression to gain an insight into precipitating events and implications of symptoms; normalize her feelings of depression. Objective Implement behavioral interventions to overcome depression. Target Date: 2023-10-07 Frequency: Biweekly  Progress: 0 Modality: individual  Related Interventions Assign the client to participate as fully as possible in a healthy exercise regimen. Encourage the client to continue participation in positive social support systems. Objective Identify and replace cognitive self-talk that supports depression. Target Date: 2023-10-07 Frequency: Biweekly  Progress: 0 Modality: individual  Related Interventions Reinforce the client's positive, reality-based cognitive messages that enhance self-confidence and increase adaptive action (see "Positive Self-Talk" in the Adult Psychotherapy Homework Planner, 2nd ed. by Bryn Gulling). Do "behavioral experiments" in which depressive automatic thoughts are treated as hypotheses/predictions, reality-based alternative hypotheses/ predictions are generated, and both are tested against the client's past, present, and/or future experiences. Encourage the client to discuss cognitive distortions, including automatic thoughts (e.g., negative view of self, future, experience) and negative schemas (e.g., core beliefs about self and others based on earlier childhood experiences); assess frequency of negative self-statements associated with depression. Objective Increase the frequency of engaging in pleasant activities. Target Date: 2023-10-07 Frequency: Biweekly  Progress: 0 Modality: individual   Related Interventions Assign the client reading materials regarding overcoming depression for women (e.g., Women and Depression: A Practical Self-Help Guide by Baird Cancer; Feeling Good by Quay Burow; Women &  Depression by Aubery Lapping; Silencing the Self: Women and Depression by Barnabas Lister). Objective Identify precipitating events and factors of depression, particularly roles of self and others in depression. Target Date: 2023-10-07 Frequency: Biweekly  Progress: 0 Modality: individual  Related Interventions Explore the degree to which primary symptoms of depression are a result of gender role socialization (e.g., passivity, decreased self-esteem, learned helplessness, interpersonal orientation towards pleasing others). Evaluate symptoms to consider the presence of culture-bound syndromes and/or culture specificity of symptoms (e.g., ataques de nervios, amok, postemigration effects). Objective Increase the level of physical exercise. Target Date: 2023-10-07 Frequency: Biweekly  Progress: 0 Modality: individual  Objective Increase social contacts and communicate needs within existing interpersonal relationships. Target Date: 2023-10-07 Frequency: Biweekly  Progress: 0 Modality: individual  5. Explore career options that have been automatically ruled out due to low self-concept or limited opportunities. Objective Identify and replace at least three negative self-talk statements that perpetuate feelings of powerlessness or hopelessness. Target Date: 2023-10-07 Frequency: Biweekly  Progress: 0 Modality: individual  Objective Identify and replace cognitive self-talk that contributes to all-or-nothing thinking, gender-role stereotypes, or low career self-efficacy. Target Date: 2023-10-07 Frequency: Biweekly  Progress: 0 Modality: individual  Objective Develop career-related strategies to overcome and persist in the face of obstacles. Target Date: 2023-10-07 Frequency: Biweekly  Progress: 0 Modality:  individual  Objective Increase self-esteem and self-concept by exploring and encompassing other roles/identities besides those of career. Target Date: 2023-10-07 Frequency: Biweekly  Progress: 0 Modality: individual  Objective Identify barriers and personal benefits to balancing career and family life. Target Date: 2023-10-07 Frequency: Biweekly  Progress: 0 Modality: individual  6. Identify and increase intrapersonal, interpersonal, and physical resources to foster positive coping strategies. 7. Improve depressed mood to maximize effective social, occupational, and physical functioning. 8. Improve self-esteem and develop a positive self-image as capable and competent. 9. Increase ability to express needs and desires openly and honestly. 10. Increase assertiveness skills and ability to advocate for self. 11. Increase awareness of own role in relationship conflicts. Objective Identify a pattern of repeatedly forming destructive intimate relationships. Target Date: 2023-10-07 Frequency: Biweekly  Progress: 0 Modality: individual  12. Increase involvement in activities that foster confidence and a sense of accomplishment. Objective Acknowledge self-disparaging statements and recognize the tendency to engage in such statements. Target Date: 2023-10-07 Frequency: Biweekly  Progress: 0 Modality: individual  Related Interventions Ask the client to complete and process an exercise in the book Ten Days to Self-Esteem! Quay Burow). Objective Verbalize an understanding of the differences between passive, assertive, and aggressive behavior. Target Date: 2023-10-07 Frequency: Biweekly  Progress: 0 Modality: individual  Objective Describe personal history of self-devaluation and lack of assertiveness. Target Date: 2023-10-07 Frequency: Biweekly  Progress: 0 Modality: individual  Related Interventions Use cognitive-restructuring techniques to change the client's belief system (i.e., via  reality-testing and rational thought) toward a more positive, realistic self-perception. Assist the client in becoming aware of how she indirectly expresses negative feelings about self (e.g., lack of eye contact, social withdrawal, sweating, expectations of failure or rejection). Assist the client in identifying negative beliefs about herself. Discuss context of self-disparagement, including client's perceptions of self from childhood to present, role of caregivers and peers, and related experiences. Objective Identify three roadblocks to improved self-esteem. Target Date: 2023-10-07 Frequency: Biweekly  Progress: 0 Modality: individual  Objective Practice saying "no" to at least one person, declining to comply with a request/favor or identify and assert rights, needs, and wants. Target Date: 2023-10-07 Frequency: Biweekly  Progress: 0 Modality: individual  Objective  Implement at least one decision based on own beliefs and values. Target Date: 2023-10-07 Frequency: Biweekly  Progress: 0 Modality: individual  13. Learn and implement various coping skills for dealing with work- and career-related inequalities. 14. Reduce self-disparaging remarks and negative self-talk.   Diagnosis:Major depressive disorder, recurrent episode, mild ([place])  Adjustment disorder with anxiety  Plan: - -meet again on Thursday, December 23, 2022 at 8am in person

## 2022-12-10 ENCOUNTER — Ambulatory Visit: Payer: 59 | Admitting: Professional

## 2022-12-16 ENCOUNTER — Encounter: Payer: Self-pay | Admitting: Professional

## 2022-12-16 ENCOUNTER — Ambulatory Visit (INDEPENDENT_AMBULATORY_CARE_PROVIDER_SITE_OTHER): Payer: 59 | Admitting: Professional

## 2022-12-16 ENCOUNTER — Ambulatory Visit: Payer: 59 | Admitting: Family Medicine

## 2022-12-16 DIAGNOSIS — F330 Major depressive disorder, recurrent, mild: Secondary | ICD-10-CM

## 2022-12-16 DIAGNOSIS — F4322 Adjustment disorder with anxiety: Secondary | ICD-10-CM | POA: Diagnosis not present
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
## 2022-12-23 ENCOUNTER — Ambulatory Visit (INDEPENDENT_AMBULATORY_CARE_PROVIDER_SITE_OTHER): Payer: 59 | Admitting: Professional

## 2022-12-23 ENCOUNTER — Encounter: Payer: Self-pay | Admitting: Professional

## 2022-12-23 DIAGNOSIS — F4322 Adjustment disorder with anxiety: Secondary | ICD-10-CM

## 2022-12-23 DIAGNOSIS — F330 Major depressive disorder, recurrent, mild: Secondary | ICD-10-CM | POA: Diagnosis not present
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
## 2022-12-27 ENCOUNTER — Other Ambulatory Visit: Payer: Self-pay | Admitting: Physician Assistant

## 2022-12-27 DIAGNOSIS — N921 Excessive and frequent menstruation with irregular cycle: Secondary | ICD-10-CM

## 2022-12-30 ENCOUNTER — Ambulatory Visit: Payer: 59 | Admitting: Professional

## 2022-12-31 ENCOUNTER — Ambulatory Visit: Payer: 59 | Admitting: Professional

## 2023-01-06 ENCOUNTER — Encounter: Payer: Self-pay | Admitting: Professional

## 2023-01-06 ENCOUNTER — Ambulatory Visit (INDEPENDENT_AMBULATORY_CARE_PROVIDER_SITE_OTHER): Payer: 59 | Admitting: Professional

## 2023-01-06 DIAGNOSIS — F4322 Adjustment disorder with anxiety: Secondary | ICD-10-CM | POA: Diagnosis not present

## 2023-01-06 DIAGNOSIS — F330 Major depressive disorder, recurrent, mild: Secondary | ICD-10-CM

## 2023-01-06 NOTE — Progress Notes (Signed)
[facility] Counselor/Therapist Progress Note  Patient ID: Connie Carlson, MRN: [id number],    Date: 01/06/2023  Time Spent: 59 minutes 804-903am   Treatment Type: Individual Therapy  Risk Assessment: Danger to Self:  No Self-injurious Behavior: No Danger to Others: No  Subjective: The patient arrived late for her in-person session.  Issues addressed: 1-pt has been working out at home on her exercise equipment 2-relationship with [name] -he has consistently contact her, however, she only chose to answer on two occasions -he is making promises to get her to come back -pt admits that she has ben controlled by [name] -pt admits that she wants to believe him but eight years tells her that he will not change 3-son being gay -how she was critical -pt feels guilty about having had negative feelings and shame for her son being gay -shared resource for patient to read 4-self-esteem -pt has moments when she is questioning -she is engaging in healthy behaviors 5-spirituality -pt discussed confusion with her spirituality due to the way she was raised and the way she was treated by professing Christians -[name] claimed to be a [place] but hasn't treated her christ-like  Treatment Plan Problems Addressed  Career Success Obstacles, Depression, Low Self-Esteem/Lack of Assertiveness, Partner Relational Problems  Goals 1. Develop a sense of own personal power and self-esteem within the relationship. 2. Develop advocacy and empowerment skills that involve taking a proactive stance in the work environment. 3. Develop assertiveness skills and techniques for application in work-related activities (e.g., establishing boundaries with coworkers, Garment/textile technologist). 4. Develop healthy cognitive mechanisms to facilitate positive attitudes and beliefs about self within the context of one's environment to mitigate depressive symptoms. Objective Articulate signs and symptoms of depression in  current life experiences. Target Date: 2023-10-07 Frequency: Biweekly  Progress: 0 Modality: individual  Related Interventions Encourage the client to share her feelings of depression to gain an insight into precipitating events and implications of symptoms; normalize her feelings of depression. Objective Implement behavioral interventions to overcome depression. Target Date: 2023-10-07 Frequency: Biweekly  Progress: 0 Modality: individual  Related Interventions Assign the client to participate as fully as possible in a healthy exercise regimen. Encourage the client to continue participation in positive social support systems. Objective Identify and replace cognitive self-talk that supports depression. Target Date: 2023-10-07 Frequency: Biweekly  Progress: 0 Modality: individual  Related Interventions Reinforce the client's positive, reality-based cognitive messages that enhance self-confidence and increase adaptive action (see "Positive Self-Talk" in the Adult Psychotherapy Homework Planner, 2nd ed. by Stephannie Li). Do "behavioral experiments" in which depressive automatic thoughts are treated as hypotheses/predictions, reality-based alternative hypotheses/ predictions are generated, and both are tested against the client's past, present, and/or future experiences. Encourage the client to discuss cognitive distortions, including automatic thoughts (e.g., negative view of self, future, experience) and negative schemas (e.g., core beliefs about self and others based on earlier childhood experiences); assess frequency of negative self-statements associated with depression. Objective Increase the frequency of engaging in pleasant activities. Target Date: 2023-10-07 Frequency: Biweekly  Progress: 0 Modality: individual  Related Interventions Assign the client reading materials regarding overcoming depression for women (e.g., Women and Depression: A Practical Self-Help Guide by Allyne Gee; Feeling Good  by Lawerance Bach; Women & Depression by Collier Bullock; Silencing the Self: Women and Depression by Ree Kida). Objective Identify precipitating events and factors of depression, particularly roles of self and others in depression. Target Date: 2023-10-07 Frequency: Biweekly  Progress: 0 Modality: individual  Related Interventions Explore the degree to which primary symptoms of  depression are a result of gender role socialization (e.g., passivity, decreased self-esteem, learned helplessness, interpersonal orientation towards pleasing others). Evaluate symptoms to consider the presence of culture-bound syndromes and/or culture specificity of symptoms (e.g., ataques de nervios, amok, postemigration effects). Objective Increase the level of physical exercise. Target Date: 2023-10-07 Frequency: Biweekly  Progress: 0 Modality: individual  Objective Increase social contacts and communicate needs within existing interpersonal relationships. Target Date: 2023-10-07 Frequency: Biweekly  Progress: 0 Modality: individual  5. Explore career options that have been automatically ruled out due to low self-concept or limited opportunities. Objective Identify and replace at least three negative self-talk statements that perpetuate feelings of powerlessness or hopelessness. Target Date: 2023-10-07 Frequency: Biweekly  Progress: 0 Modality: individual  Objective Identify and replace cognitive self-talk that contributes to all-or-nothing thinking, gender-role stereotypes, or low career self-efficacy. Target Date: 2023-10-07 Frequency: Biweekly  Progress: 0 Modality: individual  Objective Develop career-related strategies to overcome and persist in the face of obstacles. Target Date: 2023-10-07 Frequency: Biweekly  Progress: 0 Modality: individual  Objective Increase self-esteem and self-concept by exploring and encompassing other roles/identities besides those of career. Target Date: 2023-10-07 Frequency: Biweekly   Progress: 0 Modality: individual  Objective Identify barriers and personal benefits to balancing career and family life. Target Date: 2023-10-07 Frequency: Biweekly  Progress: 0 Modality: individual  6. Identify and increase intrapersonal, interpersonal, and physical resources to foster positive coping strategies. 7. Improve depressed mood to maximize effective social, occupational, and physical functioning. 8. Improve self-esteem and develop a positive self-image as capable and competent. 9. Increase ability to express needs and desires openly and honestly. 10. Increase assertiveness skills and ability to advocate for self. 11. Increase awareness of own role in relationship conflicts. Objective Identify a pattern of repeatedly forming destructive intimate relationships. Target Date: 2023-10-07 Frequency: Biweekly  Progress: 0 Modality: individual  12. Increase involvement in activities that foster confidence and a sense of accomplishment. Objective Acknowledge self-disparaging statements and recognize the tendency to engage in such statements. Target Date: 2023-10-07 Frequency: Biweekly  Progress: 0 Modality: individual  Related Interventions Ask the client to complete and process an exercise in the book Ten Days to Self-Esteem! Lawerance Bach). Objective Verbalize an understanding of the differences between passive, assertive, and aggressive behavior. Target Date: 2023-10-07 Frequency: Biweekly  Progress: 0 Modality: individual  Objective Describe personal history of self-devaluation and lack of assertiveness. Target Date: 2023-10-07 Frequency: Biweekly  Progress: 0 Modality: individual  Related Interventions Use cognitive-restructuring techniques to change the client's belief system (i.e., via reality-testing and rational thought) toward a more positive, realistic self-perception. Assist the client in becoming aware of how she indirectly expresses negative feelings about self (e.g., lack  of eye contact, social withdrawal, sweating, expectations of failure or rejection). Assist the client in identifying negative beliefs about herself. Discuss context of self-disparagement, including client's perceptions of self from childhood to present, role of caregivers and peers, and related experiences. Objective Identify three roadblocks to improved self-esteem. Target Date: 2023-10-07 Frequency: Biweekly  Progress: 0 Modality: individual  Objective Practice saying "no" to at least one person, declining to comply with a request/favor or identify and assert rights, needs, and wants. Target Date: 2023-10-07 Frequency: Biweekly  Progress: 0 Modality: individual  Objective Implement at least one decision based on own beliefs and values. Target Date: 2023-10-07 Frequency: Biweekly  Progress: 0 Modality: individual  13. Learn and implement various coping skills for dealing with work- and career-related inequalities. 14. Reduce self-disparaging remarks and negative self-talk.   Diagnosis:Major depressive disorder,  recurrent episode, mild  Adjustment disorder with anxiety  Plan: -meet again on Thursday, January 13, 2023 at 8am in person

## 2023-01-11 ENCOUNTER — Ambulatory Visit: Payer: 59 | Admitting: Professional

## 2023-01-13 ENCOUNTER — Ambulatory Visit: Payer: 59 | Admitting: Professional

## 2023-01-13 ENCOUNTER — Ambulatory Visit (INDEPENDENT_AMBULATORY_CARE_PROVIDER_SITE_OTHER): Payer: 59 | Admitting: Professional

## 2023-01-13 ENCOUNTER — Encounter: Payer: Self-pay | Admitting: Professional

## 2023-01-13 DIAGNOSIS — F330 Major depressive disorder, recurrent, mild: Secondary | ICD-10-CM

## 2023-01-13 DIAGNOSIS — F4322 Adjustment disorder with anxiety: Secondary | ICD-10-CM

## 2023-01-13 NOTE — Progress Notes (Signed)
[facility] Counselor/Therapist Progress Note  Patient ID: Connie Carlson, MRN: [id number],    Prefers to go by Connie Carlson, her middle name  Date: 01/13/2023  Time Spent: 48 minutes 803-851am   Treatment Type: Individual Therapy  Risk Assessment: Danger to Self:  No Self-injurious Behavior: No Danger to Others: No  Subjective: The patient arrived on time for her in-person session.  Issues addressed: 1-mood a-had a difficult day yesterday and took an FMLA day -this was only the second day she has taken off since ending things with Connie Carlson four weeks ago 2-ex bf Connie Carlson' parents -she saw them at [facility] and they spoke for a few minutes -she reports having shared her days off -on Tuesday morning she received a text saying they would be at her house at 12pm -she did not set a boundary and they came over -she enjoyed their visit, however, it resulted in so many memories flooding back 3-relationship with Connie Carlson and Connie Carlson -they split after he said that he could no longer be in the relationship and she felt the relationship ran it's course -"he was the best friend I ever had" -he always showed me the human part -they dated for about 8 years but remained in contact for a bit "I owe him that too", that he helped her become legal and helped her through that process -he had a problem with alcohol and "I didn't want to live like that" -"I didn't like how he handled money" -she never got to talk about herself with Connie Carlson -" never had the chance to get out anything I wanted to talk about" -he was very self-centered -pt admits she has never dated -she sees her son's father Connie Carlson as a father figure  Treatment Plan Problems Addressed  Career Success Obstacles, Depression, Low Self-Esteem/Lack of Assertiveness, Partner Relational Problems  Goals 1. Develop a sense of own personal power and self-esteem within the relationship. 2. Develop advocacy and empowerment skills that involve  taking a proactive stance in the work environment. 3. Develop assertiveness skills and techniques for application in work-related activities (e.g., establishing boundaries with coworkers, Garment/textile technologist). 4. Develop healthy cognitive mechanisms to facilitate positive attitudes and beliefs about self within the context of one's environment to mitigate depressive symptoms. Objective Articulate signs and symptoms of depression in current life experiences. Target Date: 2023-10-07 Frequency: Biweekly  Progress: 0 Modality: individual  Related Interventions Encourage the client to share her feelings of depression to gain an insight into precipitating events and implications of symptoms; normalize her feelings of depression. Objective Implement behavioral interventions to overcome depression. Target Date: 2023-10-07 Frequency: Biweekly  Progress: 0 Modality: individual  Related Interventions Assign the client to participate as fully as possible in a healthy exercise regimen. Encourage the client to continue participation in positive social support systems. Objective Identify and replace cognitive self-talk that supports depression. Target Date: 2023-10-07 Frequency: Biweekly  Progress: 0 Modality: individual  Related Interventions Reinforce the client's positive, reality-based cognitive messages that enhance self-confidence and increase adaptive action (see "Positive Self-Talk" in the Adult Psychotherapy Homework Planner, 2nd ed. by Stephannie Li). Do "behavioral experiments" in which depressive automatic thoughts are treated as hypotheses/predictions, reality-based alternative hypotheses/ predictions are generated, and both are tested against the client's past, present, and/or future experiences. Encourage the client to discuss cognitive distortions, including automatic thoughts (e.g., negative view of self, future, experience) and negative schemas (e.g., core beliefs about self and others based on  earlier childhood experiences); assess frequency of negative self-statements associated with depression.  Objective Increase the frequency of engaging in pleasant activities. Target Date: 2023-10-07 Frequency: Biweekly  Progress: 0 Modality: individual  Related Interventions Assign the client reading materials regarding overcoming depression for women (e.g., Women and Depression: A Practical Self-Help Guide by Allyne Gee; Feeling Good by Lawerance Bach; Women & Depression by Collier Bullock; Silencing the Self: Women and Depression by Ree Kida). Objective Identify precipitating events and factors of depression, particularly roles of self and others in depression. Target Date: 2023-10-07 Frequency: Biweekly  Progress: 0 Modality: individual  Related Interventions Explore the degree to which primary symptoms of depression are a result of gender role socialization (e.g., passivity, decreased self-esteem, learned helplessness, interpersonal orientation towards pleasing others). Evaluate symptoms to consider the presence of culture-bound syndromes and/or culture specificity of symptoms (e.g., ataques de nervios, amok, postemigration effects). Objective Increase the level of physical exercise. Target Date: 2023-10-07 Frequency: Biweekly  Progress: 0 Modality: individual  Objective Increase social contacts and communicate needs within existing interpersonal relationships. Target Date: 2023-10-07 Frequency: Biweekly  Progress: 0 Modality: individual  5. Explore career options that have been automatically ruled out due to low self-concept or limited opportunities. Objective Identify and replace at least three negative self-talk statements that perpetuate feelings of powerlessness or hopelessness. Target Date: 2023-10-07 Frequency: Biweekly  Progress: 0 Modality: individual  Objective Identify and replace cognitive self-talk that contributes to all-or-nothing thinking, gender-role stereotypes, or low career  self-efficacy. Target Date: 2023-10-07 Frequency: Biweekly  Progress: 0 Modality: individual  Objective Develop career-related strategies to overcome and persist in the face of obstacles. Target Date: 2023-10-07 Frequency: Biweekly  Progress: 0 Modality: individual  Objective Increase self-esteem and self-concept by exploring and encompassing other roles/identities besides those of career. Target Date: 2023-10-07 Frequency: Biweekly  Progress: 0 Modality: individual  Objective Identify barriers and personal benefits to balancing career and family life. Target Date: 2023-10-07 Frequency: Biweekly  Progress: 0 Modality: individual  6. Identify and increase intrapersonal, interpersonal, and physical resources to foster positive coping strategies. 7. Improve depressed mood to maximize effective social, occupational, and physical functioning. 8. Improve self-esteem and develop a positive self-image as capable and competent. 9. Increase ability to express needs and desires openly and honestly. 10. Increase assertiveness skills and ability to advocate for self. 11. Increase awareness of own role in relationship conflicts. Objective Identify a pattern of repeatedly forming destructive intimate relationships. Target Date: 2023-10-07 Frequency: Biweekly  Progress: 0 Modality: individual  12. Increase involvement in activities that foster confidence and a sense of accomplishment. Objective Acknowledge self-disparaging statements and recognize the tendency to engage in such statements. Target Date: 2023-10-07 Frequency: Biweekly  Progress: 0 Modality: individual  Related Interventions Ask the client to complete and process an exercise in the book Ten Days to Self-Esteem! Lawerance Bach). Objective Verbalize an understanding of the differences between passive, assertive, and aggressive behavior. Target Date: 2023-10-07 Frequency: Biweekly  Progress: 0 Modality: individual  Objective Describe personal  history of self-devaluation and lack of assertiveness. Target Date: 2023-10-07 Frequency: Biweekly  Progress: 0 Modality: individual  Related Interventions Use cognitive-restructuring techniques to change the client's belief system (i.e., via reality-testing and rational thought) toward a more positive, realistic self-perception. Assist the client in becoming aware of how she indirectly expresses negative feelings about self (e.g., lack of eye contact, social withdrawal, sweating, expectations of failure or rejection). Assist the client in identifying negative beliefs about herself. Discuss context of self-disparagement, including client's perceptions of self from childhood to present, role of caregivers and peers, and related experiences. Objective Identify three roadblocks to  improved self-esteem. Target Date: 2023-10-07 Frequency: Biweekly  Progress: 0 Modality: individual  Objective Practice saying "no" to at least one person, declining to comply with a request/favor or identify and assert rights, needs, and wants. Target Date: 2023-10-07 Frequency: Biweekly  Progress: 0 Modality: individual  Objective Implement at least one decision based on own beliefs and values. Target Date: 2023-10-07 Frequency: Biweekly  Progress: 0 Modality: individual  13. Learn and implement various coping skills for dealing with work- and career-related inequalities. 14. Reduce self-disparaging remarks and negative self-talk.   Diagnosis:Major depressive disorder, recurrent episode, mild  Adjustment disorder with anxiety  Plan: -continue working on setting and keeping boundaries -continue working on the guilt she feels "I wanna stop feeling like that" -meet again on Friday, Feb 04, 2023 at 8am in person

## 2023-01-21 ENCOUNTER — Ambulatory Visit: Payer: 59 | Admitting: Professional

## 2023-01-26 ENCOUNTER — Telehealth: Payer: Self-pay | Admitting: Family Medicine

## 2023-01-26 NOTE — Telephone Encounter (Signed)
Pt's FMLA forms received and are currently being processed. Will call pt once completed for payment.

## 2023-01-26 NOTE — Telephone Encounter (Signed)
Called pt and gave her the update from [place]

## 2023-01-26 NOTE — Telephone Encounter (Signed)
Patient called status of FMLA paperwork she is asking if you received it please contact patient

## 2023-01-31 ENCOUNTER — Telehealth: Payer: Self-pay | Admitting: Family Medicine

## 2023-01-31 NOTE — Telephone Encounter (Signed)
Patient called again in regards to [facility] paperwork stated she needs to done by May 18th

## 2023-02-03 NOTE — Telephone Encounter (Signed)
Pt's forms completed. Pt called [facility] advising that this has been done and that she will need to pay the fee for completion

## 2023-02-04 ENCOUNTER — Ambulatory Visit: Payer: 59 | Admitting: Professional

## 2023-02-08 ENCOUNTER — Encounter: Payer: Self-pay | Admitting: Family Medicine

## 2023-02-08 ENCOUNTER — Ambulatory Visit (INDEPENDENT_AMBULATORY_CARE_PROVIDER_SITE_OTHER): Payer: 59 | Admitting: Family Medicine

## 2023-02-08 VITALS — BP 133/74 | HR 65 | Ht 61.0 in | Wt 195.0 lb

## 2023-02-08 DIAGNOSIS — G43009 Migraine without aura, not intractable, without status migrainosus: Secondary | ICD-10-CM | POA: Diagnosis not present

## 2023-02-08 DIAGNOSIS — F4329 Adjustment disorder with other symptoms: Secondary | ICD-10-CM

## 2023-02-08 DIAGNOSIS — I10 Essential (primary) hypertension: Secondary | ICD-10-CM | POA: Diagnosis not present

## 2023-02-08 DIAGNOSIS — R232 Flushing: Secondary | ICD-10-CM

## 2023-02-08 DIAGNOSIS — N921 Excessive and frequent menstruation with irregular cycle: Secondary | ICD-10-CM

## 2023-02-08 DIAGNOSIS — Z7689 Persons encountering health services in other specified circumstances: Secondary | ICD-10-CM

## 2023-02-08 MED ORDER — ELETRIPTAN HYDROBROMIDE 40 MG PO TABS: 40.0000 mg | ORAL_TABLET | ORAL | 2 refills | Status: DC | PRN

## 2023-02-08 MED ORDER — TRETINOIN 0.05 % EX CREA: TOPICAL_CREAM | Freq: Every day | CUTANEOUS | 1 refills | Status: DC

## 2023-02-08 MED ORDER — EMGALITY 120 MG/ML ~~LOC~~ SOAJ: 240.0000 mg | Freq: Once | SUBCUTANEOUS | 2 refills | Status: AC

## 2023-02-08 NOTE — Assessment & Plan Note (Signed)
Currently engaged in therapy/counseling and doing well which is fantastic.  Not currently on prescription medication for management.

## 2023-02-08 NOTE — Progress Notes (Signed)
Established Patient Office Visit  Subjective   Patient ID: Connie Carlson, female    DOB: Nov 25, 1980  Age: 42 y.o. MRN: [id number]  Chief Complaint  Patient presents with   Migraine    HPI  F/U migraines - says thei topiramate is not helping.  She feels it was causing breakthrough bleeding.   Reports the maxalt wasn't helping.  She says it just does not to be effective.  She does get some occasional bumps and spots on her skin and her sister has been using the tretinoin cream that is been working well and she wondered if she could have that as well.  He is also been try to workout exercise and lose weight and was doing well up until about 4 months ago.  She just has plateaued mood and even though she is continue to eat healthy and exercise she just has not really been able to move the needle.  And so she wants to know if there is anything we could do going further.  In regards to mood she has been working with Olegario Messier our therapist here since January and that has been helpful.  Also reports that she has noticed a pattern of getting facial flushing when she eats pork.     ROS    Objective:     BP 133/74   Pulse 65   Ht 5\' 1"  (1.549 m)   Wt 195 lb (88.5 kg)   SpO2 100%   BMI 36.84 kg/m    Physical Exam Vitals and nursing note reviewed.  Constitutional:      Appearance: She is well-developed.  HENT:     Head: Normocephalic and atraumatic.  Cardiovascular:     Rate and Rhythm: Normal rate and regular rhythm.     Heart sounds: Normal heart sounds.  Pulmonary:     Effort: Pulmonary effort is normal.     Breath sounds: Normal breath sounds.  Skin:    General: Skin is warm and dry.  Neurological:     Mental Status: She is alert and oriented to person, place, and time.  Psychiatric:        Behavior: Behavior normal.      No results found for any visits on 02/08/23.    The 10-year ASCVD risk score (Arnett DK, et al., 2019) is: 0.6%    Assessment & Plan:    Problem List Items Addressed This Visit       Cardiovascular and Mediastinum   Migraine headache without aura - Primary    Topamax only partially effective and caused intermenstrual bleeding.  Has tried atenolol and metoprolol as well. Maxalt didn't work   See if we can get Em[employer] covered and will switch to Relpax.  Follow-up in 3 months.      Relevant Medications   Galcanezumab-gnlm (EMGALITY) 120 MG/ML SOAJ   eletriptan (RELPAX) 40 MG tablet   Other Relevant Orders   TSH   Allergen, Pork, f26   Allergen, Pork IgG   Estradiol   Progesterone   Hypertension   Relevant Orders   TSH   Allergen, Pork, f26   Allergen, Pork IgG   Estradiol   Progesterone   COMPLETE METABOLIC PANEL WITH GFR   Amb Ref to Medical Weight Management     Other   Stress and adjustment reaction    Currently engaged in therapy/counseling and doing well which is fantastic.  Not currently on prescription medication for management.      Severe obesity (BMI 35.0-39.9)  with comorbidity [facility])    For all she has done fantastic.  Her weight is down to 195 it was 199.  But she feels like she is struggling because she has plateaued over the last several months.  We discussed the option of referral to healthy weight and wellness here in [place] and she is open to that.      Relevant Orders   Amb Ref to Medical Weight Management   Other Visit Diagnoses     Menorrhagia with irregular cycle       Relevant Orders   TSH   Allergen, Pork, f26   Allergen, Pork IgG   Estradiol   Progesterone   Facial flushing       Relevant Orders   TSH   Allergen, Pork, f26   Allergen, Pork IgG   Estradiol   Progesterone   Encounter for weight management       Relevant Orders   TSH   Allergen, Pork, f26   Allergen, Pork IgG   Estradiol   Progesterone      She would like to have her hormones and thyroid checked just to make sure that it is not working against her and her attempts for weight loss.   Referral placed for healthy weight and wellness.  Return in about 3 months (around 05/11/2023) for Migraines and BP.    Nani Gasser, MD

## 2023-02-08 NOTE — Assessment & Plan Note (Signed)
For all she has done fantastic.  Her weight is down to 195 it was 199.  But she feels like she is struggling because she has plateaued over the last several months.  We discussed the option of referral to healthy weight and wellness here in [place] and she is open to that.

## 2023-02-08 NOTE — Assessment & Plan Note (Addendum)
Topamax only partially effective and caused intermenstrual bleeding.  Has tried atenolol and metoprolol as well. Maxalt didn't work   See if we can get [employer] covered and will switch to Relpax.  Follow-up in 3 months.

## 2023-02-15 ENCOUNTER — Telehealth: Payer: Self-pay

## 2023-02-15 NOTE — Telephone Encounter (Signed)
[facility] called to ask if the prescription should read inject 240 mg into the skin once for 1 dose for loading dose then 120 mg instead of 140 mg Tenstrike monthly.

## 2023-02-15 NOTE — Telephone Encounter (Signed)
Yes, 2 and 40 for the loading dose and then 120 mg monthly thereafter.  Sorry for the typo.

## 2023-02-15 NOTE — Telephone Encounter (Signed)
Pharmacy advised  

## 2023-02-16 LAB — COMPLETE METABOLIC PANEL WITH GFR
Calcium: 9.1 mg/dL (ref 8.6–10.2)
Chloride: 106 mmol/L (ref 98–110)
Globulin: 2.7 g/dL (calc) (ref 1.9–3.7)
Potassium: 4.1 mmol/L (ref 3.5–5.3)
Total Bilirubin: 0.3 mg/dL (ref 0.2–1.2)

## 2023-02-16 LAB — ALLERGEN, PORK, F26: Allergen, Pork, f26: <0.1 kU/L

## 2023-02-17 NOTE — Progress Notes (Signed)
Hi Tailor, thyroid looks great.  No pork allergy.  Hormones are consistent with being postmenopausal.  So if you have any more vaginal bleeding then please let me know.  Metabolic panel looks great.

## 2023-02-18 ENCOUNTER — Ambulatory Visit: Payer: 59 | Admitting: Professional

## 2023-02-18 ENCOUNTER — Ambulatory Visit (INDEPENDENT_AMBULATORY_CARE_PROVIDER_SITE_OTHER): Payer: 59 | Admitting: Professional

## 2023-02-18 ENCOUNTER — Encounter: Payer: Self-pay | Admitting: Professional

## 2023-02-18 DIAGNOSIS — F330 Major depressive disorder, recurrent, mild: Secondary | ICD-10-CM

## 2023-02-18 DIAGNOSIS — F4322 Adjustment disorder with anxiety: Secondary | ICD-10-CM

## 2023-02-18 NOTE — Progress Notes (Signed)
[facility] Counselor/Therapist Progress Note  Patient ID: Connie Carlson, MRN: [id number],    Prefers to go by Connie Carlson, her middle name  Date: 02/18/2023  Time Spent: 48 minutes 803-851am   Treatment Type: Individual Therapy  Risk Assessment: Danger to Self:  No Self-injurious Behavior: No Danger to Others: No  Subjective: The patient arrived on time for her in-person session.  Issues addressed: 1-homework- completed and ongoing -continue working on setting and keeping boundaries -continue working on the guilt she feels "I wanna stop feeling like that" 2-Edgar [name]) -has talked or seen him several times -she has been having recent thoughts of all the manipulation that he did -pt is recognizing that he loved -pt is setting limits with contact with [name] -pt knows it is not good for her -pt thinks her desire to see him is more on dependency -his daughter has not been around and he has only seen his son 2-3 times -she has seen him on three occasions since we last met and did not spent the night -pt did not go with him and his son and stepson to [place] [employer] despite his request -he wanted to take her car and she told him no and that she was not going -pt was invited to go kayaking by [name] and sh said she could go for one hour [place]   -she then went on her way to a cookout with her family -pt admits to guilt but is able to recognize that [name] created his own problems 2-anger -pt getting mad so easily -when at work it ruins her entire day -educated -Chief Strategy Officer Plan Problems Addressed  Career Success Obstacles, Depression, Low Self-Esteem/Lack of Assertiveness, Partner Relational Problems  Goals 1. Develop a sense of own personal power and self-esteem within the relationship. 2. Develop advocacy and empowerment skills that involve taking a proactive stance in the work environment. 3. Develop  assertiveness skills and techniques for application in work-related activities (e.g., establishing boundaries with coworkers, Garment/textile technologist). 4. Develop healthy cognitive mechanisms to facilitate positive attitudes and beliefs about self within the context of one's environment to mitigate depressive symptoms. Objective Articulate signs and symptoms of depression in current life experiences. Target Date: 2023-10-07 Frequency: Biweekly  Progress: 0 Modality: individual  Related Interventions Encourage the client to share her feelings of depression to gain an insight into precipitating events and implications of symptoms; normalize her feelings of depression. Objective Implement behavioral interventions to overcome depression. Target Date: 2023-10-07 Frequency: Biweekly  Progress: 0 Modality: individual  Related Interventions Assign the client to participate as fully as possible in a healthy exercise regimen. Encourage the client to continue participation in positive social support systems. Objective Identify and replace cognitive self-talk that supports depression. Target Date: 2023-10-07 Frequency: Biweekly  Progress: 0 Modality: individual  Related Interventions Reinforce the client's positive, reality-based cognitive messages that enhance self-confidence and increase adaptive action (see "Positive Self-Talk" in the Adult Psychotherapy Homework Planner, 2nd ed. by Stephannie Li). Do "behavioral experiments" in which depressive automatic thoughts are treated as hypotheses/predictions, reality-based alternative hypotheses/ predictions are generated, and both are tested against the client's past, present, and/or future experiences. Encourage the client to discuss cognitive distortions, including automatic thoughts (e.g., negative view of self, future, experience) and negative schemas (e.g., core beliefs about self and others based on earlier childhood experiences); assess frequency of negative  self-statements associated with depression. Objective Increase the frequency of engaging in pleasant  activities. Target Date: 2023-10-07 Frequency: Biweekly  Progress: 0 Modality: individual  Related Interventions Assign the client reading materials regarding overcoming depression for women (e.g., Women and Depression: A Practical Self-Help Guide by Allyne Gee; Feeling Good by Lawerance Bach; Women & Depression by Collier Bullock; Silencing the Self: Women and Depression by Ree Kida). Objective Identify precipitating events and factors of depression, particularly roles of self and others in depression. Target Date: 2023-10-07 Frequency: Biweekly  Progress: 0 Modality: individual  Related Interventions Explore the degree to which primary symptoms of depression are a result of gender role socialization (e.g., passivity, decreased self-esteem, learned helplessness, interpersonal orientation towards pleasing others). Evaluate symptoms to consider the presence of culture-bound syndromes and/or culture specificity of symptoms (e.g., ataques de nervios, amok, postemigration effects). Objective Increase the level of physical exercise. Target Date: 2023-10-07 Frequency: Biweekly  Progress: 0 Modality: individual  Objective Increase social contacts and communicate needs within existing interpersonal relationships. Target Date: 2023-10-07 Frequency: Biweekly  Progress: 0 Modality: individual  5. Explore career options that have been automatically ruled out due to low self-concept or limited opportunities. Objective Identify and replace at least three negative self-talk statements that perpetuate feelings of powerlessness or hopelessness. Target Date: 2023-10-07 Frequency: Biweekly  Progress: 0 Modality: individual  Objective Identify and replace cognitive self-talk that contributes to all-or-nothing thinking, gender-role stereotypes, or low career self-efficacy. Target Date: 2023-10-07 Frequency: Biweekly  Progress: 0  Modality: individual  Objective Develop career-related strategies to overcome and persist in the face of obstacles. Target Date: 2023-10-07 Frequency: Biweekly  Progress: 0 Modality: individual  Objective Increase self-esteem and self-concept by exploring and encompassing other roles/identities besides those of career. Target Date: 2023-10-07 Frequency: Biweekly  Progress: 0 Modality: individual  Objective Identify barriers and personal benefits to balancing career and family life. Target Date: 2023-10-07 Frequency: Biweekly  Progress: 0 Modality: individual  6. Identify and increase intrapersonal, interpersonal, and physical resources to foster positive coping strategies. 7. Improve depressed mood to maximize effective social, occupational, and physical functioning. 8. Improve self-esteem and develop a positive self-image as capable and competent. 9. Increase ability to express needs and desires openly and honestly. 10. Increase assertiveness skills and ability to advocate for self. 11. Increase awareness of own role in relationship conflicts. Objective Identify a pattern of repeatedly forming destructive intimate relationships. Target Date: 2023-10-07 Frequency: Biweekly  Progress: 0 Modality: individual  12. Increase involvement in activities that foster confidence and a sense of accomplishment. Objective Acknowledge self-disparaging statements and recognize the tendency to engage in such statements. Target Date: 2023-10-07 Frequency: Biweekly  Progress: 0 Modality: individual  Related Interventions Ask the client to complete and process an exercise in the book Ten Days to Self-Esteem! Lawerance Bach). Objective Verbalize an understanding of the differences between passive, assertive, and aggressive behavior. Target Date: 2023-10-07 Frequency: Biweekly  Progress: 0 Modality: individual  Objective Describe personal history of self-devaluation and lack of assertiveness. Target Date:  2023-10-07 Frequency: Biweekly  Progress: 0 Modality: individual  Related Interventions Use cognitive-restructuring techniques to change the client's belief system (i.e., via reality-testing and rational thought) toward a more positive, realistic self-perception. Assist the client in becoming aware of how she indirectly expresses negative feelings about self (e.g., lack of eye contact, social withdrawal, sweating, expectations of failure or rejection). Assist the client in identifying negative beliefs about herself. Discuss context of self-disparagement, including client's perceptions of self from childhood to present, role of caregivers and peers, and related experiences. Objective Identify three roadblocks to improved self-esteem. Target Date: 2023-10-07 Frequency: Biweekly  Progress: 0 Modality: individual  Objective Practice saying "no" to at least one person, declining to comply with a request/favor or identify and assert rights, needs, and wants. Target Date: 2023-10-07 Frequency: Biweekly  Progress: 0 Modality: individual  Objective Implement at least one decision based on own beliefs and values. Target Date: 2023-10-07 Frequency: Biweekly  Progress: 0 Modality: individual  13. Learn and implement various coping skills for dealing with work- and career-related inequalities. 14. Reduce self-disparaging remarks and negative self-talk.   Diagnosis:Major depressive disorder, recurrent episode, mild (HCC)  Adjustment disorder with anxiety  Plan: -meet again on Friday, March 04, 2023 at 8am in person

## 2023-03-01 ENCOUNTER — Encounter: Payer: 59 | Admitting: Nurse Practitioner

## 2023-03-03 ENCOUNTER — Telehealth: Payer: Self-pay | Admitting: Family Medicine

## 2023-03-03 NOTE — Telephone Encounter (Signed)
Spoke with pharmacist-  states [facility] needs PA. Forwarding to [employer].

## 2023-03-03 NOTE — Telephone Encounter (Signed)
Patient called stating she was told by the pharmacy that the dosage of her Injectable migraine medication needed to be change in order to be covered by insurance.  Patient mentioned some adjustments need to be made to her FMLA paperwork to allow more days off.  Patient is continuing to have issues relating to her period.

## 2023-03-03 NOTE — Telephone Encounter (Signed)
Agree needs OV but as far at medication quantity please call pharmacy an d see what change they are needing

## 2023-03-04 ENCOUNTER — Encounter: Payer: Self-pay | Admitting: Professional

## 2023-03-04 ENCOUNTER — Ambulatory Visit (INDEPENDENT_AMBULATORY_CARE_PROVIDER_SITE_OTHER): Payer: 59 | Admitting: Professional

## 2023-03-04 DIAGNOSIS — F4322 Adjustment disorder with anxiety: Secondary | ICD-10-CM | POA: Diagnosis not present

## 2023-03-04 DIAGNOSIS — F330 Major depressive disorder, recurrent, mild: Secondary | ICD-10-CM

## 2023-03-04 NOTE — Progress Notes (Signed)
[facility] Counselor/Therapist Progress Note  Patient ID: Connie Carlson, MRN: [id number],    Prefers to go by Connie Carlson, her middle name  Date: 03/04/2023  Time Spent: 59 minutes [id number]   Treatment Type: Individual Therapy  Risk Assessment: Danger to Self:  No Self-injurious Behavior: No Danger to Others: No  Subjective: The patient arrived on time for her in-person session.  Issues addressed: 1-Giovanni -when having coffee with him he commented that she never did anything for his kids -he called [name] and she did not know pt was present -[name] stated that patient Connie Carlson was the reason she no longer lives with her father -suggested pt needed to consider why she still engages with [name] -pt misses work every time she meets with [employer] -me with her boss and was able to share her feelings with boss regarding feels disrespected -pt's boss listened and "kind of" apologized -pt shared that after seven years she does not feel as good about coming to work -since Insurance account manager changed one year ago she has not been happy -pt shared that she feels burned out at work -pt as been employed at [employer] for seven years; there are twelve people and only 3 have less seniority -assertiveness skills -stepping out of comfort zone  Treatment Plan Problems Addressed  Career Success Obstacles, Depression, Low Self-Esteem/Lack of Assertiveness, Partner Relational Problems  Goals 1. Develop a sense of own personal power and self-esteem within the relationship. 2. Develop advocacy and empowerment skills that involve taking a proactive stance in the work environment. 3. Develop assertiveness skills and techniques for application in work-related activities (e.g., establishing boundaries with coworkers, Garment/textile technologist). 4. Develop healthy cognitive mechanisms to facilitate positive attitudes and beliefs about self within the context of one's environment to mitigate  depressive symptoms. Objective Articulate signs and symptoms of depression in current life experiences. Target Date: 2023-10-07 Frequency: Biweekly  Progress: 0 Modality: individual  Related Interventions Encourage the client to share her feelings of depression to gain an insight into precipitating events and implications of symptoms; normalize her feelings of depression. Objective Implement behavioral interventions to overcome depression. Target Date: 2023-10-07 Frequency: Biweekly  Progress: 0 Modality: individual  Related Interventions Assign the client to participate as fully as possible in a healthy exercise regimen. Encourage the client to continue participation in positive social support systems. Objective Identify and replace cognitive self-talk that supports depression. Target Date: 2023-10-07 Frequency: Biweekly  Progress: 0 Modality: individual  Related Interventions Reinforce the client's positive, reality-based cognitive messages that enhance self-confidence and increase adaptive action (see "Positive Self-Talk" in the Adult Psychotherapy Homework Planner, 2nd ed. by Stephannie Li). Do "behavioral experiments" in which depressive automatic thoughts are treated as hypotheses/predictions, reality-based alternative hypotheses/ predictions are generated, and both are tested against the client's past, present, and/or future experiences. Encourage the client to discuss cognitive distortions, including automatic thoughts (e.g., negative view of self, future, experience) and negative schemas (e.g., core beliefs about self and others based on earlier childhood experiences); assess frequency of negative self-statements associated with depression. Objective Increase the frequency of engaging in pleasant activities. Target Date: 2023-10-07 Frequency: Biweekly  Progress: 0 Modality: individual  Related Interventions Assign the client reading materials regarding overcoming depression for women  (e.g., Women and Depression: A Practical Self-Help Guide by Allyne Gee; Feeling Good by Lawerance Bach; Women & Depression by Collier Bullock; Silencing the Self: Women and Depression by Ree Kida). Objective Identify precipitating events and factors of depression, particularly roles of self and others in depression. Target Date: 2023-10-07 Frequency:  Biweekly  Progress: 0 Modality: individual  Related Interventions Explore the degree to which primary symptoms of depression are a result of gender role socialization (e.g., passivity, decreased self-esteem, learned helplessness, interpersonal orientation towards pleasing others). Evaluate symptoms to consider the presence of culture-bound syndromes and/or culture specificity of symptoms (e.g., ataques de nervios, amok, postemigration effects). Objective Increase the level of physical exercise. Target Date: 2023-10-07 Frequency: Biweekly  Progress: 0 Modality: individual  Objective Increase social contacts and communicate needs within existing interpersonal relationships. Target Date: 2023-10-07 Frequency: Biweekly  Progress: 0 Modality: individual  5. Explore career options that have been automatically ruled out due to low self-concept or limited opportunities. Objective Identify and replace at least three negative self-talk statements that perpetuate feelings of powerlessness or hopelessness. Target Date: 2023-10-07 Frequency: Biweekly  Progress: 0 Modality: individual  Objective Identify and replace cognitive self-talk that contributes to all-or-nothing thinking, gender-role stereotypes, or low career self-efficacy. Target Date: 2023-10-07 Frequency: Biweekly  Progress: 0 Modality: individual  Objective Develop career-related strategies to overcome and persist in the face of obstacles. Target Date: 2023-10-07 Frequency: Biweekly  Progress: 0 Modality: individual  Objective Increase self-esteem and self-concept by exploring and encompassing other  roles/identities besides those of career. Target Date: 2023-10-07 Frequency: Biweekly  Progress: 0 Modality: individual  Objective Identify barriers and personal benefits to balancing career and family life. Target Date: 2023-10-07 Frequency: Biweekly  Progress: 0 Modality: individual  6. Identify and increase intrapersonal, interpersonal, and physical resources to foster positive coping strategies. 7. Improve depressed mood to maximize effective social, occupational, and physical functioning. 8. Improve self-esteem and develop a positive self-image as capable and competent. 9. Increase ability to express needs and desires openly and honestly. 10. Increase assertiveness skills and ability to advocate for self. 11. Increase awareness of own role in relationship conflicts. Objective Identify a pattern of repeatedly forming destructive intimate relationships. Target Date: 2023-10-07 Frequency: Biweekly  Progress: 0 Modality: individual  12. Increase involvement in activities that foster confidence and a sense of accomplishment. Objective Acknowledge self-disparaging statements and recognize the tendency to engage in such statements. Target Date: 2023-10-07 Frequency: Biweekly  Progress: 0 Modality: individual  Related Interventions Ask the client to complete and process an exercise in the book Ten Days to Self-Esteem! Lawerance Bach). Objective Verbalize an understanding of the differences between passive, assertive, and aggressive behavior. Target Date: 2023-10-07 Frequency: Biweekly  Progress: 0 Modality: individual  Objective Describe personal history of self-devaluation and lack of assertiveness. Target Date: 2023-10-07 Frequency: Biweekly  Progress: 0 Modality: individual  Related Interventions Use cognitive-restructuring techniques to change the client's belief system (i.e., via reality-testing and rational thought) toward a more positive, realistic self-perception. Assist the client in  becoming aware of how she indirectly expresses negative feelings about self (e.g., lack of eye contact, social withdrawal, sweating, expectations of failure or rejection). Assist the client in identifying negative beliefs about herself. Discuss context of self-disparagement, including client's perceptions of self from childhood to present, role of caregivers and peers, and related experiences. Objective Identify three roadblocks to improved self-esteem. Target Date: 2023-10-07 Frequency: Biweekly  Progress: 0 Modality: individual  Objective Practice saying "no" to at least one person, declining to comply with a request/favor or identify and assert rights, needs, and wants. Target Date: 2023-10-07 Frequency: Biweekly  Progress: 0 Modality: individual  Objective Implement at least one decision based on own beliefs and values. Target Date: 2023-10-07 Frequency: Biweekly  Progress: 0 Modality: individual  13. Learn and implement various coping skills for dealing  with work- and career-related inequalities. 14. Reduce self-disparaging remarks and negative self-talk.   Diagnosis:Major depressive disorder, recurrent episode, mild (HCC)  Adjustment disorder with anxiety  Plan: -meet again on Friday, March 18, 2023 at 8am in person

## 2023-03-08 ENCOUNTER — Ambulatory Visit: Payer: 59 | Admitting: Nurse Practitioner

## 2023-03-08 ENCOUNTER — Encounter: Payer: Self-pay | Admitting: Nurse Practitioner

## 2023-03-08 VITALS — BP 128/83 | HR 77 | Temp 98.4°F | Ht 61.0 in | Wt 196.0 lb

## 2023-03-08 DIAGNOSIS — I10 Essential (primary) hypertension: Secondary | ICD-10-CM

## 2023-03-08 DIAGNOSIS — Z6837 Body mass index (BMI) 37.0-37.9, adult: Secondary | ICD-10-CM

## 2023-03-08 DIAGNOSIS — E669 Obesity, unspecified: Secondary | ICD-10-CM

## 2023-03-08 DIAGNOSIS — Z0289 Encounter for other administrative examinations: Secondary | ICD-10-CM

## 2023-03-08 NOTE — Progress Notes (Signed)
Office: [phone]  /  Fax: [phone]   Initial Visit  Connie Carlson was seen in clinic today to evaluate for obesity. She is interested in losing weight to improve overall health and reduce the risk of weight related complications. She presents today to review program treatment options, initial physical assessment, and evaluation.     She was referred by: PCP  When asked what else they would like to accomplish? She states: Adopt healthier eating patterns, Improve energy levels and physical activity, Improve existing medical conditions, Reduce number of medications, Improve quality of life, Improve self-confidence, and Lose a target amount of weight : Goal weight 160 lbs  Weight history:  She started gaining weight as a teenager.  Her weight increased when she moved to the [place] when she as 42 years old.  Her weight has fluctuated over the years.    When asked how has your weight affected you? She states: Has affected self-esteem, Contributed to medical problems, Having fatigue, and Having poor endurance  Some associated conditions: HTN, allergies, GERD, IFG, depression  Contributing factors: Family history, Medications, Reduced physical activity, Life event, and Pregnancy  Weight promoting medications identified: Contraceptives or hormonal therapy  Current nutrition plan: None  She has done atkins and low carb in the past.    Current level of physical activity: None  Current or previous pharmacotherapy: Phentermine  Response to medication: stopped due to side effects   Past medical history includes:  History reviewed. No pertinent past medical history.   Objective:   BP 128/83   Pulse 77   Temp 98.4 F (36.9 C)   Ht 5\' 1"  (1.549 m)   Wt 196 lb (88.9 kg)   LMP  (LMP Unknown)   SpO2 99%   BMI 37.03 kg/m  She was weighed on the bioimpedance scale: Body mass index is 37.03 kg/m.  Peak Weight:245 lbs , Body Fat%:44, Visceral Fat Rating:11, Weight trend over the last 12  months: Decreasing  General:  Alert, oriented and cooperative. Patient is in no acute distress.  Respiratory: Normal respiratory effort, no problems with respiration noted   Gait: able to ambulate independently  Mental Status: Normal mood and affect. Normal behavior. Normal judgment and thought content.   DIAGNOSTIC DATA REVIEWED:  BMET    Component Value Date/Time   NA 139 02/15/2023 0826   K 4.1 02/15/2023 0826   CL 106 02/15/2023 0826   CO2 23 02/15/2023 0826   GLUCOSE 96 02/15/2023 0826   BUN 16 02/15/2023 0826   CREATININE 0.78 02/15/2023 0826   CALCIUM 9.1 02/15/2023 0826   GFRNONAA 103 10/17/2020 0000   GFRAA 119 10/17/2020 0000   Lab Results  Component Value Date   HGBA1C 5.1 04/06/2022   HGBA1C 5.8 (H) 10/08/2013   No results found for: "INSULIN" CBC    Component Value Date/Time   WBC 7.7 08/25/2022 1411   RBC 4.07 08/25/2022 1411   HGB 12.2 08/25/2022 1411   HCT 37.1 08/25/2022 1411   PLT 290 08/25/2022 1411   MCV 91.2 08/25/2022 1411   MCH 30.0 08/25/2022 1411   MCHC 32.9 08/25/2022 1411   RDW 12.8 08/25/2022 1411   Iron/TIBC/Ferritin/ %Sat    Component Value Date/Time   IRON 42 05/08/2015 1632   TIBC 480 (H) 05/08/2015 1632   IRONPCTSAT 9 (L) 05/08/2015 1632   Lipid Panel     Component Value Date/Time   CHOL 139 02/02/2022 0000   TRIG 76 02/02/2022 0000   HDL 49 (L)  02/02/2022 0000   CHOLHDL 2.8 02/02/2022 0000   VLDL 19 05/06/2015 0954   LDLCALC 74 02/02/2022 0000   Hepatic Function Panel     Component Value Date/Time   PROT 6.6 02/15/2023 0826   ALBUMIN 4.1 05/06/2015 0954   AST 11 02/15/2023 0826   ALT 13 02/15/2023 0826   ALKPHOS 69 05/06/2015 0954   BILITOT 0.3 02/15/2023 0826      Component Value Date/Time   TSH 1.18 02/15/2023 0826     Assessment and Plan:   Primary hypertension Continue to follow up with PCP.  Continue meds as directed.   Generalized obesity  BMI 37.0-37.9, adult        Obesity Treatment /  Action Plan:  Patient will work on garnering support from family and friends to begin weight loss journey. Will work on eliminating or reducing the presence of highly palatable, calorie dense foods in the home. Will complete provided nutritional and psychosocial assessment questionnaire before the next appointment. Will be scheduled for indirect calorimetry to determine resting energy expenditure in a fasting state.  This will allow [place] to create a reduced calorie, high-protein meal plan to promote loss of fat mass while preserving muscle mass. Counseled on the health benefits of losing 5%-15% of total body weight. Was counseled on nutritional approaches to weight loss and benefits of reducing processed foods and consuming plant-based foods and high quality protein as part of nutritional weight management. Was counseled on pharmacotherapy and role as an adjunct in weight management.   Obesity Education Performed Today:  She was weighed on the bioimpedance scale and results were discussed and documented in the synopsis.  We discussed obesity as a disease and the importance of a more detailed evaluation of all the factors contributing to the disease.  We discussed the importance of long term lifestyle changes which include nutrition, exercise and behavioral modifications as well as the importance of customizing this to her specific health and social needs.  We discussed the benefits of reaching a healthier weight to alleviate the symptoms of existing conditions and reduce the risks of the biomechanical, metabolic and psychological effects of obesity.  Connie Carlson appears to be in the action stage of change and states they are ready to start intensive lifestyle modifications and behavioral modifications.  30 minutes was spent today on this visit including the above counseling, pre-visit chart review, and post-visit documentation.  Reviewed by clinician on day of visit: allergies, medications,  problem list, medical history, surgical history, family history, social history, and previous encounter notes pertinent to obesity diagnosis.    Theodis Sato Khrystyne Arpin FNP-C

## 2023-03-10 LAB — COMPLETE METABOLIC PANEL WITH GFR
AG Ratio: 1.4 (calc) (ref 1.0–2.5)
ALT: 13 U/L (ref 6–29)
AST: 11 U/L (ref 10–30)
Albumin: 3.9 g/dL (ref 3.6–5.1)
Alkaline phosphatase (APISO): 40 U/L (ref 31–125)
BUN: 16 mg/dL (ref 7–25)
CO2: 23 mmol/L (ref 20–32)
Creat: 0.78 mg/dL (ref 0.50–0.99)
Glucose, Bld: 96 mg/dL (ref 65–99)
Sodium: 139 mmol/L (ref 135–146)
Total Protein: 6.6 g/dL (ref 6.1–8.1)
eGFR: 97 mL/min/{1.73_m2} (ref >=60)

## 2023-03-10 LAB — ALLERGEN, PORK IGG: Pork IgG: 9.5 ug/mL — ABNORMAL HIGH (ref <2.0)

## 2023-03-10 LAB — INTERPRETATION:

## 2023-03-10 LAB — ESTRADIOL: Estradiol: <15 pg/mL

## 2023-03-10 LAB — ALLERGEN, PORK, F26: CLASS: 0

## 2023-03-10 LAB — PROGESTERONE: Progesterone: <0.5 ng/mL

## 2023-03-10 LAB — TSH: TSH: 1.18 mIU/L

## 2023-03-11 NOTE — Progress Notes (Signed)
Your IGG came back positive for pork. You are not allergic but you have some sensitivity to it. I would avoid it and see if helps any adverse symptoms.   Tandy Gaw PA-C

## 2023-03-14 ENCOUNTER — Encounter: Payer: Self-pay | Admitting: Medical-Surgical

## 2023-03-14 ENCOUNTER — Ambulatory Visit: Payer: 59 | Admitting: Medical-Surgical

## 2023-03-14 VITALS — BP 145/78 | HR 69 | Resp 20 | Ht 61.0 in | Wt 200.8 lb

## 2023-03-14 DIAGNOSIS — G43009 Migraine without aura, not intractable, without status migrainosus: Secondary | ICD-10-CM | POA: Diagnosis not present

## 2023-03-14 MED ORDER — NURTEC 75 MG PO TBDP: 75.0000 mg | ORAL_TABLET | Freq: Every day | ORAL | 0 refills | Status: DC | PRN

## 2023-03-14 NOTE — Progress Notes (Signed)
        Established patient visit  History, exam, impression, and plan:  1. Migraine without aura and without status migrainosus, not intractable Very pleasant 42 year old female presenting today for follow-up on migraines.  Notes that she and her PCP has been working very closely to try and find some relief for her migraines given their increased strength and frequency.  Notes that she had another migraine last night that affected both sides of the head from the maxillary sinuses up into the forehead.  This was accompanied by photophobia, photophobia, nausea, vomiting, and generalized malaise.  Notes her symptoms were severe and she consider going to the ED but did not figure they would do much else for her and she can do at home.  Her previous treatment with Advil Liqui-Gels or Tylenol is no longer helpful and she does not have anything on hand to help with nausea.  Notes that her symptoms have improved after resting overnight but she has been unable to get the new medications that were prescribed for her due to pending prior authorization.  She has tried and failed multiple preventative care medications and abortive medications in the past.  We did review current options that may benefit her.  Ultimately, would like to proceed with getting her on Relpax and Emgality if we can get this pushed through insurance.  If not, consider Nurtec.  Offered migraine cocktail here today but she declined as she no longer felt that her symptoms were a migraine.  She is interested in seeing if Nurtec will go through insurance so I have sent this in.  I did advise her to avoid taking Nurtec and Relpax together.  A message has been sent via epic to expedite prior authorization requests.  Patient reports FMLA paperwork has been sent to our office for completion by her PCP.  Procedures performed this visit: None.  Return if symptoms worsen or fail to improve.  __________________________________ Thayer Ohm, DNP, APRN,  FNP-BC Primary Care and Sports Medicine [facility] [place]

## 2023-03-15 ENCOUNTER — Telehealth: Payer: Self-pay

## 2023-03-15 ENCOUNTER — Ambulatory Visit: Payer: 59 | Admitting: Bariatrics

## 2023-03-15 DIAGNOSIS — G43009 Migraine without aura, not intractable, without status migrainosus: Secondary | ICD-10-CM

## 2023-03-15 NOTE — Telephone Encounter (Signed)
Initiated Prior authorization VZD:GLOVFI 75MG  dispersible tablets Via: Covermymeds Case/Key:[id number] Status: denied as of 6\25\24 Reason:Document: EPA_ETC_V 1 Decision Notes: This drug is not on our list of covered drugs, also known as a formulary. Our Coverage Determinations - Exceptions policy is used to decide if a not-covered drug can be approved. The conditions in this policy have not been met. From the records that we have received, these reasons caused the denial: 1) All covered drugs used for your health issue have not been tried and failed. Other drugs that can be used are Reyvow, Bernita Raisin and Zavzpret. Please look at the formulary to see what drugs are covered. Prior authorization may be required and quantity limits may apply to covered drugs. ADDITIONAL INFORMATION FOR YOUR HEALTH CARE PROVIDER: This request has not been approved because this drug is not on formulary. An exception to allow coverage of a non-formulary drug may be granted if all conditions in our Coverage Determinations - Exceptions policy are met. From the information we have received, the member does not meet number 2 of the exception policy criteria. The reason for denial is explained to the member above. The criteria from the policy are listed here. 1) The drug is being used for a condition approved by the [place] (FDA). 2) All formulary alternatives have been tried or medical reasons have been provided why all other covered drugs cannot be tried. 3) Records have been received showing the requested drug is medically necessary. These should include relevant medical history and lab results, past treatments tried with dates of trial and responses, and any other evidence to show the covered drugs are likely to be ineffective or unsafe for the member. 4) Prescription drug samples were not used to establish treatment. Since criteria have not been met, we are unable to approve coverage for this drug at  this time. Please refer to the formulary for information on what is covered. Prior authorization may be required and quantity limits may apply to covered drugs. Notified Pt via: Mychart   Initiated Prior authorization EPP:IRJJOACZ 120MG /ML auto-injectors (migraine) Via: Covermymeds Case/Key: [id number] Status: approved  as of 6\60\63 Reason:Authorization Expiration Date: September 16, 2023. Notified Pt via: Mychart

## 2023-03-16 ENCOUNTER — Telehealth: Payer: Self-pay | Admitting: Family Medicine

## 2023-03-16 DIAGNOSIS — G43009 Migraine without aura, not intractable, without status migrainosus: Secondary | ICD-10-CM

## 2023-03-16 NOTE — Telephone Encounter (Signed)
Orders Placed This Encounter  Procedures   MR Brain W Wo Contrast    Standing Status:   Future    Standing Expiration Date:   03/15/2024    Order Specific Question:   If indicated for the ordered procedure, I authorize the administration of contrast media per Radiology protocol    Answer:   Yes    Order Specific Question:   What is the patient's sedation requirement?    Answer:   No Sedation    Order Specific Question:   Does the patient have a pacemaker or implanted devices?    Answer:   No    Order Specific Question:   Preferred imaging location?    Answer:   [facility] (table limit 350lbs)

## 2023-03-16 NOTE — Telephone Encounter (Signed)
Will order the MRI and fwd to Dr. Linford Arnold for review.  I have not seen any forms for her FMLA.

## 2023-03-16 NOTE — Telephone Encounter (Signed)
Patient called. Pt states since  med for migraine is not working  can she get an MRI? Also Did you get FMLA paperwork from [employer] for migraine.

## 2023-03-17 MED ORDER — UBRELVY 100 MG PO TABS
1.0000 | ORAL_TABLET | Freq: Every day | ORAL | 3 refills | Status: DC | PRN
Start: 2023-03-17 — End: 2023-05-19

## 2023-03-17 NOTE — Telephone Encounter (Signed)
Please call patient: They will not pay for her Nurtec they denied it but they said they would cover Reyvow, Bernita Raisin or Arturo Morton.  Will send over prescription for Ubrelvy to take as needed for acute migraines instead of the Relpax.  We could also see if she is open to a prophylactic medication that is an injectable monthly such as Emgality.

## 2023-03-17 NOTE — Addendum Note (Signed)
Addended by: Nani Gasser D on: 03/17/2023 05:01 PM   Modules accepted: Orders

## 2023-03-18 ENCOUNTER — Ambulatory Visit (INDEPENDENT_AMBULATORY_CARE_PROVIDER_SITE_OTHER): Payer: 59 | Admitting: Professional

## 2023-03-18 ENCOUNTER — Encounter: Payer: Self-pay | Admitting: Professional

## 2023-03-18 DIAGNOSIS — F4322 Adjustment disorder with anxiety: Secondary | ICD-10-CM

## 2023-03-18 DIAGNOSIS — F330 Major depressive disorder, recurrent, mild: Secondary | ICD-10-CM

## 2023-03-18 NOTE — Progress Notes (Signed)
[facility] Counselor/Therapist Progress Note  Patient ID: Connie Carlson, MRN: [id number],    Prefers to go by Connie Carlson, her middle name  Date: 03/18/2023  Time Spent: 59 minutes 803-902am   Treatment Type: Individual Therapy  Risk Assessment: Danger to Self:  No Self-injurious Behavior: No Danger to Others: No  Subjective: The patient arrived late for her in-person session.  Issues addressed: 1-relationships a-son -pt's son went to [place] as planned and pt was able to get through her worry -pt was told by son to not ask so many questions -pt expected son to tell her what he was doing, where he was staying and he told her no -pt reminded that son is an adult and given that he was raised in [place], his customs are not [place] -intimate -has had no contact with Connie Carlson -has realized that the first two men in her life were the ones she wished she had now -her son's father was kind and loving but she wasn't ready for a long-term relationship with him -her boyfriend that helped her gain citizenship was wonderful but she was not willing to commit -both men have moved on and are in committed relationships -pt admits that she needs to heal from her losses -pt unwilling to allow a man to treat her the way that Connie Carlson had 2-professional -met with boss and supervisor regarding two weeks off for Christmas holiday -pt expressed disappointment due to their lack of response as pt placed request Nov 2023 -pt changed request due to no response for her to secure two weeks off at Easter -pt has still not received confirmation -she was provided several days off to go to the beach with her son  Treatment Plan Problems Addressed  Career Success Obstacles, Depression, Low Self-Esteem/Lack of Assertiveness, Partner Relational Problems  Goals 1. Develop a sense of own personal power and self-esteem within the relationship. 2. Develop advocacy and empowerment skills that involve  taking a proactive stance in the work environment. 3. Develop assertiveness skills and techniques for application in work-related activities (e.g., establishing boundaries with coworkers, Garment/textile technologist). 4. Develop healthy cognitive mechanisms to facilitate positive attitudes and beliefs about self within the context of one's environment to mitigate depressive symptoms. Objective Articulate signs and symptoms of depression in current life experiences. Target Date: 2023-10-07 Frequency: Biweekly  Progress: 0 Modality: individual  Related Interventions Encourage the client to share her feelings of depression to gain an insight into precipitating events and implications of symptoms; normalize her feelings of depression. Objective Implement behavioral interventions to overcome depression. Target Date: 2023-10-07 Frequency: Biweekly  Progress: 0 Modality: individual  Related Interventions Assign the client to participate as fully as possible in a healthy exercise regimen. Encourage the client to continue participation in positive social support systems. Objective Identify and replace cognitive self-talk that supports depression. Target Date: 2023-10-07 Frequency: Biweekly  Progress: 0 Modality: individual  Related Interventions Reinforce the client's positive, reality-based cognitive messages that enhance self-confidence and increase adaptive action (see "Positive Self-Talk" in the Adult Psychotherapy Homework Planner, 2nd ed. by Stephannie Li). Do "behavioral experiments" in which depressive automatic thoughts are treated as hypotheses/predictions, reality-based alternative hypotheses/ predictions are generated, and both are tested against the client's past, present, and/or future experiences. Encourage the client to discuss cognitive distortions, including automatic thoughts (e.g., negative view of self, future, experience) and negative schemas (e.g., core beliefs about self and others based on  earlier childhood experiences); assess frequency of negative self-statements associated with depression. Objective Increase the frequency  of engaging in pleasant activities. Target Date: 2023-10-07 Frequency: Biweekly  Progress: 0 Modality: individual  Related Interventions Assign the client reading materials regarding overcoming depression for women (e.g., Women and Depression: A Practical Self-Help Guide by Allyne Gee; Feeling Good by Lawerance Bach; Women & Depression by Collier Bullock; Silencing the Self: Women and Depression by Ree Kida). Objective Identify precipitating events and factors of depression, particularly roles of self and others in depression. Target Date: 2023-10-07 Frequency: Biweekly  Progress: 0 Modality: individual  Related Interventions Explore the degree to which primary symptoms of depression are a result of gender role socialization (e.g., passivity, decreased self-esteem, learned helplessness, interpersonal orientation towards pleasing others). Evaluate symptoms to consider the presence of culture-bound syndromes and/or culture specificity of symptoms (e.g., ataques de nervios, amok, postemigration effects). Objective Increase the level of physical exercise. Target Date: 2023-10-07 Frequency: Biweekly  Progress: 0 Modality: individual  Objective Increase social contacts and communicate needs within existing interpersonal relationships. Target Date: 2023-10-07 Frequency: Biweekly  Progress: 0 Modality: individual  5. Explore career options that have been automatically ruled out due to low self-concept or limited opportunities. Objective Identify and replace at least three negative self-talk statements that perpetuate feelings of powerlessness or hopelessness. Target Date: 2023-10-07 Frequency: Biweekly  Progress: 0 Modality: individual  Objective Identify and replace cognitive self-talk that contributes to all-or-nothing thinking, gender-role stereotypes, or low career  self-efficacy. Target Date: 2023-10-07 Frequency: Biweekly  Progress: 0 Modality: individual  Objective Develop career-related strategies to overcome and persist in the face of obstacles. Target Date: 2023-10-07 Frequency: Biweekly  Progress: 0 Modality: individual  Objective Increase self-esteem and self-concept by exploring and encompassing other roles/identities besides those of career. Target Date: 2023-10-07 Frequency: Biweekly  Progress: 0 Modality: individual  Objective Identify barriers and personal benefits to balancing career and family life. Target Date: 2023-10-07 Frequency: Biweekly  Progress: 0 Modality: individual  6. Identify and increase intrapersonal, interpersonal, and physical resources to foster positive coping strategies. 7. Improve depressed mood to maximize effective social, occupational, and physical functioning. 8. Improve self-esteem and develop a positive self-image as capable and competent. 9. Increase ability to express needs and desires openly and honestly. 10. Increase assertiveness skills and ability to advocate for self. 11. Increase awareness of own role in relationship conflicts. Objective Identify a pattern of repeatedly forming destructive intimate relationships. Target Date: 2023-10-07 Frequency: Biweekly  Progress: 0 Modality: individual  12. Increase involvement in activities that foster confidence and a sense of accomplishment. Objective Acknowledge self-disparaging statements and recognize the tendency to engage in such statements. Target Date: 2023-10-07 Frequency: Biweekly  Progress: 0 Modality: individual  Related Interventions Ask the client to complete and process an exercise in the book Ten Days to Self-Esteem! Lawerance Bach). Objective Verbalize an understanding of the differences between passive, assertive, and aggressive behavior. Target Date: 2023-10-07 Frequency: Biweekly  Progress: 0 Modality: individual  Objective Describe personal  history of self-devaluation and lack of assertiveness. Target Date: 2023-10-07 Frequency: Biweekly  Progress: 0 Modality: individual  Related Interventions Use cognitive-restructuring techniques to change the client's belief system (i.e., via reality-testing and rational thought) toward a more positive, realistic self-perception. Assist the client in becoming aware of how she indirectly expresses negative feelings about self (e.g., lack of eye contact, social withdrawal, sweating, expectations of failure or rejection). Assist the client in identifying negative beliefs about herself. Discuss context of self-disparagement, including client's perceptions of self from childhood to present, role of caregivers and peers, and related experiences. Objective Identify three roadblocks to improved self-esteem. Target Date:  2023-10-07 Frequency: Biweekly  Progress: 0 Modality: individual  Objective Practice saying "no" to at least one person, declining to comply with a request/favor or identify and assert rights, needs, and wants. Target Date: 2023-10-07 Frequency: Biweekly  Progress: 0 Modality: individual  Objective Implement at least one decision based on own beliefs and values. Target Date: 2023-10-07 Frequency: Biweekly  Progress: 0 Modality: individual  13. Learn and implement various coping skills for dealing with work- and career-related inequalities. 14. Reduce self-disparaging remarks and negative self-talk.   Diagnosis:Major depressive disorder, recurrent episode, mild (HCC)  Adjustment disorder with anxiety  Plan: -meet again on Friday, March 18, 2023 at 8am in person

## 2023-03-21 ENCOUNTER — Encounter: Payer: Self-pay | Admitting: Family Medicine

## 2023-03-21 ENCOUNTER — Telehealth: Payer: Self-pay

## 2023-03-21 NOTE — Telephone Encounter (Signed)
Forms completed and placed in Tony B basket 

## 2023-03-21 NOTE — Telephone Encounter (Signed)
Forms completed. OK for pt to pick up and pay fee.    There is another opemn note in this regard as well.

## 2023-03-21 NOTE — Telephone Encounter (Signed)
Patient called inquiring about FMLA paperwork for migraines.  She states the last day to send this is was either July 6th or 7th.  She was just wanting to be sure that this had been received and processed. I told her I would forward her message to her provider.

## 2023-03-22 ENCOUNTER — Ambulatory Visit: Payer: 59 | Admitting: Nurse Practitioner

## 2023-03-22 ENCOUNTER — Telehealth (HOSPITAL_BASED_OUTPATIENT_CLINIC_OR_DEPARTMENT_OTHER): Payer: Self-pay

## 2023-03-22 NOTE — Telephone Encounter (Addendum)
FMLA Forms faxed, called patient to inform that forms are completed faxed, and $29 fee needs to be paid, patient states that she will come by office to pick up on 03/23/23, thanks.   FMLA forms faxed, successful confirmation 03/22/23.

## 2023-03-22 NOTE — Telephone Encounter (Signed)
Patient informed that paperwork ready - she will call and pay fee with credit card for the paperwork to be faxed.

## 2023-03-22 NOTE — Telephone Encounter (Signed)
Forms placed up front for pt to be contacted for fee and forms to either be faxed or picked up by patient.

## 2023-03-23 ENCOUNTER — Telehealth: Payer: Self-pay | Admitting: Family Medicine

## 2023-03-23 DIAGNOSIS — G43009 Migraine without aura, not intractable, without status migrainosus: Secondary | ICD-10-CM

## 2023-03-23 MED ORDER — AJOVY 225 MG/1.5ML ~~LOC~~ SOAJ
224.0000 mg | SUBCUTANEOUS | 3 refills | Status: DC
Start: 2023-03-23 — End: 2023-05-19

## 2023-03-23 NOTE — Telephone Encounter (Signed)
Will see if Ajovy is covered.  She still needs something for prophylaxis and then has [place] for as needed treatment.  Meds ordered this encounter  Medications   Fremanezumab-vfrm (AJOVY) 225 MG/1.5ML SOAJ    Sig: Inject 224 mg into the skin every 30 (thirty) days.    Dispense:  1.68 mL    Refill:  3

## 2023-03-25 ENCOUNTER — Telehealth (HOSPITAL_BASED_OUTPATIENT_CLINIC_OR_DEPARTMENT_OTHER): Payer: Self-pay

## 2023-03-25 NOTE — Telephone Encounter (Signed)
Patient advised.

## 2023-03-29 ENCOUNTER — Encounter: Payer: Self-pay | Admitting: Professional

## 2023-03-29 ENCOUNTER — Ambulatory Visit: Payer: 59 | Admitting: Nurse Practitioner

## 2023-03-29 ENCOUNTER — Ambulatory Visit (INDEPENDENT_AMBULATORY_CARE_PROVIDER_SITE_OTHER): Payer: 59 | Admitting: Professional

## 2023-03-29 DIAGNOSIS — F330 Major depressive disorder, recurrent, mild: Secondary | ICD-10-CM | POA: Diagnosis not present

## 2023-03-29 DIAGNOSIS — F4322 Adjustment disorder with anxiety: Secondary | ICD-10-CM

## 2023-03-29 NOTE — Progress Notes (Signed)
[facility] Counselor/Therapist Progress Note  Patient ID: Connie Carlson, MRN: [id number],    Prefers to go by Connie Carlson, her middle name  Date: 03/29/2023  Time Spent:  47 minutes [id number]   Treatment Type: Individual Therapy  Risk Assessment: Danger to Self:  No Self-injurious Behavior: No Danger to Others: No  Subjective: This session was held via video teletherapy. The patient consented to video teletherapy and was located in her home during this session. She is aware it is the responsibility of the patient to secure confidentiality on her end of the session. The provider was in a private home office for the duration of this session.    The patient arrived on time for her Caregility appointment.   Issues addressed: 1-self-esteem -pt had been watching youtube videos -her attention was caught around how other people see her 2-sister's relationship with parents -gets all the attention of her parents -sister had a seizure disorder and her parents were very protective -sister continues to have strong relationship with father, and with mother -pt feels envious of sister's relationship and wishes that she had the same 3-how to forge a relationship with parents -educated -examples -pt wiling to try 3-professional -still has not received approval for her Easter vacation  Treatment Plan Problems Addressed  Career Success Obstacles, Depression, Low Self-Esteem/Lack of Assertiveness, Partner Relational Problems  Goals 1. Develop a sense of own personal power and self-esteem within the relationship. 2. Develop advocacy and empowerment skills that involve taking a proactive stance in the work environment. 3. Develop assertiveness skills and techniques for application in work-related activities (e.g., establishing boundaries with coworkers, Garment/textile technologist). 4. Develop healthy cognitive mechanisms to facilitate positive attitudes and beliefs about self within the context  of one's environment to mitigate depressive symptoms. Objective Articulate signs and symptoms of depression in current life experiences. Target Date: 2023-10-07 Frequency: Biweekly  Progress: 0 Modality: individual  Related Interventions Encourage the client to share her feelings of depression to gain an insight into precipitating events and implications of symptoms; normalize her feelings of depression. Objective Implement behavioral interventions to overcome depression. Target Date: 2023-10-07 Frequency: Biweekly  Progress: 0 Modality: individual  Related Interventions Assign the client to participate as fully as possible in a healthy exercise regimen. Encourage the client to continue participation in positive social support systems. Objective Identify and replace cognitive self-talk that supports depression. Target Date: 2023-10-07 Frequency: Biweekly  Progress: 0 Modality: individual  Related Interventions Reinforce the client's positive, reality-based cognitive messages that enhance self-confidence and increase adaptive action (see "Positive Self-Talk" in the Adult Psychotherapy Homework Planner, 2nd ed. by Stephannie Li). Do "behavioral experiments" in which depressive automatic thoughts are treated as hypotheses/predictions, reality-based alternative hypotheses/ predictions are generated, and both are tested against the client's past, present, and/or future experiences. Encourage the client to discuss cognitive distortions, including automatic thoughts (e.g., negative view of self, future, experience) and negative schemas (e.g., core beliefs about self and others based on earlier childhood experiences); assess frequency of negative self-statements associated with depression. Objective Increase the frequency of engaging in pleasant activities. Target Date: 2023-10-07 Frequency: Biweekly  Progress: 0 Modality: individual  Related Interventions Assign the client reading materials regarding  overcoming depression for women (e.g., Women and Depression: A Practical Self-Help Guide by Allyne Gee; Feeling Good by Lawerance Bach; Women & Depression by Collier Bullock; Silencing the Self: Women and Depression by Ree Kida). Objective Identify precipitating events and factors of depression, particularly roles of self and others in depression. Target Date: 2023-10-07 Frequency: Biweekly  Progress: 0 Modality: individual  Related Interventions Explore the degree to which primary symptoms of depression are a result of gender role socialization (e.g., passivity, decreased self-esteem, learned helplessness, interpersonal orientation towards pleasing others). Evaluate symptoms to consider the presence of culture-bound syndromes and/or culture specificity of symptoms (e.g., ataques de nervios, amok, postemigration effects). Objective Increase the level of physical exercise. Target Date: 2023-10-07 Frequency: Biweekly  Progress: 0 Modality: individual  Objective Increase social contacts and communicate needs within existing interpersonal relationships. Target Date: 2023-10-07 Frequency: Biweekly  Progress: 0 Modality: individual  5. Explore career options that have been automatically ruled out due to low self-concept or limited opportunities. Objective Identify and replace at least three negative self-talk statements that perpetuate feelings of powerlessness or hopelessness. Target Date: 2023-10-07 Frequency: Biweekly  Progress: 0 Modality: individual  Objective Identify and replace cognitive self-talk that contributes to all-or-nothing thinking, gender-role stereotypes, or low career self-efficacy. Target Date: 2023-10-07 Frequency: Biweekly  Progress: 0 Modality: individual  Objective Develop career-related strategies to overcome and persist in the face of obstacles. Target Date: 2023-10-07 Frequency: Biweekly  Progress: 0 Modality: individual  Objective Increase self-esteem and self-concept by exploring and  encompassing other roles/identities besides those of career. Target Date: 2023-10-07 Frequency: Biweekly  Progress: 0 Modality: individual  Objective Identify barriers and personal benefits to balancing career and family life. Target Date: 2023-10-07 Frequency: Biweekly  Progress: 0 Modality: individual  6. Identify and increase intrapersonal, interpersonal, and physical resources to foster positive coping strategies. 7. Improve depressed mood to maximize effective social, occupational, and physical functioning. 8. Improve self-esteem and develop a positive self-image as capable and competent. 9. Increase ability to express needs and desires openly and honestly. 10. Increase assertiveness skills and ability to advocate for self. 11. Increase awareness of own role in relationship conflicts. Objective Identify a pattern of repeatedly forming destructive intimate relationships. Target Date: 2023-10-07 Frequency: Biweekly  Progress: 0 Modality: individual  12. Increase involvement in activities that foster confidence and a sense of accomplishment. Objective Acknowledge self-disparaging statements and recognize the tendency to engage in such statements. Target Date: 2023-10-07 Frequency: Biweekly  Progress: 0 Modality: individual  Related Interventions Ask the client to complete and process an exercise in the book Ten Days to Self-Esteem! Lawerance Bach). Objective Verbalize an understanding of the differences between passive, assertive, and aggressive behavior. Target Date: 2023-10-07 Frequency: Biweekly  Progress: 0 Modality: individual  Objective Describe personal history of self-devaluation and lack of assertiveness. Target Date: 2023-10-07 Frequency: Biweekly  Progress: 0 Modality: individual  Related Interventions Use cognitive-restructuring techniques to change the client's belief system (i.e., via reality-testing and rational thought) toward a more positive, realistic  self-perception. Assist the client in becoming aware of how she indirectly expresses negative feelings about self (e.g., lack of eye contact, social withdrawal, sweating, expectations of failure or rejection). Assist the client in identifying negative beliefs about herself. Discuss context of self-disparagement, including client's perceptions of self from childhood to present, role of caregivers and peers, and related experiences. Objective Identify three roadblocks to improved self-esteem. Target Date: 2023-10-07 Frequency: Biweekly  Progress: 0 Modality: individual  Objective Practice saying "no" to at least one person, declining to comply with a request/favor or identify and assert rights, needs, and wants. Target Date: 2023-10-07 Frequency: Biweekly  Progress: 0 Modality: individual  Objective Implement at least one decision based on own beliefs and values. Target Date: 2023-10-07 Frequency: Biweekly  Progress: 0 Modality: individual  13. Learn and implement various coping skills for dealing with work-  and career-related inequalities. 14. Reduce self-disparaging remarks and negative self-talk.   Diagnosis:Major depressive disorder, recurrent episode, mild (HCC)  Adjustment disorder with anxiety  Plan: -meet again on Friday, April 15, 2023 at 8am in person

## 2023-03-30 ENCOUNTER — Other Ambulatory Visit: Payer: Self-pay | Admitting: Family Medicine

## 2023-03-30 ENCOUNTER — Ambulatory Visit: Payer: 59 | Admitting: Bariatrics

## 2023-03-30 ENCOUNTER — Encounter: Payer: Self-pay | Admitting: Bariatrics

## 2023-03-30 VITALS — BP 114/73 | HR 69 | Temp 99.2°F | Ht 61.0 in | Wt 198.0 lb

## 2023-03-30 DIAGNOSIS — I10 Essential (primary) hypertension: Secondary | ICD-10-CM

## 2023-03-30 DIAGNOSIS — R0602 Shortness of breath: Secondary | ICD-10-CM | POA: Diagnosis not present

## 2023-03-30 DIAGNOSIS — R7309 Other abnormal glucose: Secondary | ICD-10-CM | POA: Diagnosis not present

## 2023-03-30 DIAGNOSIS — E559 Vitamin D deficiency, unspecified: Secondary | ICD-10-CM | POA: Diagnosis not present

## 2023-03-30 DIAGNOSIS — E669 Obesity, unspecified: Secondary | ICD-10-CM | POA: Insufficient documentation

## 2023-03-30 DIAGNOSIS — R5383 Other fatigue: Secondary | ICD-10-CM | POA: Insufficient documentation

## 2023-03-30 DIAGNOSIS — Z1331 Encounter for screening for depression: Secondary | ICD-10-CM

## 2023-03-30 DIAGNOSIS — F32A Depression, unspecified: Secondary | ICD-10-CM

## 2023-03-30 DIAGNOSIS — Z6837 Body mass index (BMI) 37.0-37.9, adult: Secondary | ICD-10-CM | POA: Insufficient documentation

## 2023-03-30 DIAGNOSIS — G4709 Other insomnia: Secondary | ICD-10-CM

## 2023-03-30 DIAGNOSIS — Z0000 Encounter for general adult medical examination without abnormal findings: Secondary | ICD-10-CM | POA: Insufficient documentation

## 2023-03-31 LAB — TSH+T4F+T3FREE
Free T4: 1.08 ng/dL (ref 0.82–1.77)
T3, Free: 3.4 pg/mL (ref 2.0–4.4)
TSH: 1.27 u[IU]/mL (ref 0.450–4.500)

## 2023-03-31 LAB — COMPREHENSIVE METABOLIC PANEL
ALT: 16 IU/L (ref 0–32)
AST: 17 IU/L (ref 0–40)
Albumin: 4.1 g/dL (ref 3.9–4.9)
Alkaline Phosphatase: 50 IU/L (ref 44–121)
BUN/Creatinine Ratio: 16 (ref 9–23)
BUN: 14 mg/dL (ref 6–24)
Bilirubin Total: 0.2 mg/dL (ref 0.0–1.2)
CO2: 22 mmol/L (ref 20–29)
Calcium: 9.5 mg/dL (ref 8.7–10.2)
Chloride: 104 mmol/L (ref 96–106)
Creatinine, Ser: 0.87 mg/dL (ref 0.57–1.00)
Globulin, Total: 2.8 g/dL (ref 1.5–4.5)
Glucose: 88 mg/dL (ref 70–99)
Potassium: 4.7 mmol/L (ref 3.5–5.2)
Sodium: 139 mmol/L (ref 134–144)
Total Protein: 6.9 g/dL (ref 6.0–8.5)
eGFR: 85 mL/min/{1.73_m2} (ref >59)

## 2023-03-31 LAB — LIPID PANEL WITH LDL/HDL RATIO
Cholesterol, Total: 150 mg/dL (ref 100–199)
HDL: 53 mg/dL (ref >39)
LDL Chol Calc (NIH): 80 mg/dL (ref 0–99)
LDL/HDL Ratio: 1.5 ratio (ref 0.0–3.2)
Triglycerides: 93 mg/dL (ref 0–149)
VLDL Cholesterol Cal: 17 mg/dL (ref 5–40)

## 2023-03-31 LAB — VITAMIN D 25 HYDROXY (VIT D DEFICIENCY, FRACTURES): Vit D, 25-Hydroxy: 41.9 ng/mL (ref 30.0–100.0)

## 2023-03-31 LAB — HEMOGLOBIN A1C
Est. average glucose Bld gHb Est-mCnc: 111 mg/dL
Hgb A1c MFr Bld: 5.5 % (ref 4.8–5.6)

## 2023-03-31 LAB — INSULIN, RANDOM: INSULIN: 12.4 u[IU]/mL (ref 2.6–24.9)

## 2023-04-04 NOTE — Progress Notes (Signed)
Chief Complaint:   OBESITY Connie Carlson (MR# [id number]) is a 42 y.o. female who presents for evaluation and treatment of obesity and related comorbidities. Current BMI is Body mass index is 37.41 kg/m. Connie Carlson has been struggling with her weight for many years and has been unsuccessful in either losing weight, maintaining weight loss, or reaching her healthy weight goal.  Connie Carlson is currently in the action stage of change and ready to dedicate time achieving and maintaining a healthier weight. Connie Carlson is interested in becoming our patient and working on intensive lifestyle modifications including (but not limited to) diet and exercise for weight loss.  Patient is here for her initial visit. She met with Judeth Cornfield, nurse practitioner on 03/08/2023. She does like to cook.   Connie Carlson's habits were reviewed today and are as follows: Her family eats meals together, she thinks her family will eat healthier with her, her desired weight loss is 38 lbs, she has been heavy most of her life, she started gaining weight after moving to the [place], her heaviest weight ever was 142 pounds, she has significant food cravings issues, she skips meals frequently, she is frequently drinking liquids with calories, she frequently makes poor food choices, and she struggles with emotional eating.  Depression Screen Connie Carlson's Food and Mood (modified PHQ-9) score was 13.  Subjective:   1. Other fatigue Connie Carlson admits to daytime somnolence and admits to waking up still tired. Patient has a history of symptoms of daytime fatigue and morning fatigue. Connie Carlson generally gets 6 or 7 hours of sleep per night, and states that she has nightime awakenings. Snoring is present. Apneic episodes are not present. Epworth Sleepiness Score is 6.   2. SOB (shortness of breath) on exertion Connie Carlson notes increasing shortness of breath with exercising and seems to be worsening over time with weight gain. She notes getting out of breath sooner with activity than she used  to. This has not gotten worse recently. Connie Carlson denies shortness of breath at rest or orthopnea.  3. Essential hypertension Patient's blood pressure was controlled. She is taking valsartan.   4. Health care maintenance Given obesity.   5. Vitamin D deficiency Patient takes B12 and tumeric.   6. Elevated glucose Patient has a maternal family history of Type 2 diabetes mellitus.   7. Other insomnia Patient notes insomnia.   Assessment/Plan:   1. Other fatigue Connie Carlson does feel that her weight is causing her energy to be lower than it should be. Fatigue may be related to obesity, depression or many other causes. Labs will be ordered, and in the meanwhile, Connie Carlson will focus on self care including making healthy food choices, increasing physical activity and focusing on stress reduction.  - EKG 12-Lead - TSH+T4F+T3Free  2. SOB (shortness of breath) on exertion Connie Carlson does feel that she gets out of breath more easily that she used to when she exercises. Connie Carlson's shortness of breath appears to be obesity related and exercise induced. She has agreed to work on weight loss and gradually increase exercise to treat her exercise induced shortness of breath. Will continue to monitor closely.  - TSH+T4F+T3Free  3. Essential hypertension Patient will continue her medications, and will work on her diet and exercise.   4. Health care maintenance We will check labs today, and will follow-up at her next.  - Insulin, random - Hemoglobin A1c - Lipid Panel With LDL/HDL Ratio - TSH+T4F+T3Free - VITAMIN D 25 Hydroxy (Vit-D Deficiency, Fractures) - Comprehensive metabolic panel  5. Vitamin D deficiency We will check labs today, and we will follow-up at patient's next visit.   - VITAMIN D 25 Hydroxy (Vit-D Deficiency, Fractures)  6. Elevated glucose We will check labs today, and we will follow-up at patient's next visit.   - Insulin, random - Hemoglobin A1c  7. Other insomnia Sheet for sleep was given.  Patient is to try magnesium 400 mg; Valerian root.   8. Depression screening Connie Carlson had a positive depression screening. Depression is commonly associated with obesity and often results in emotional eating behaviors. We will monitor this closely and work on CBT to help improve the non-hunger eating patterns. Referral to Psychology may be required if no improvement is seen as she continues in our clinic.  9. Generalized obesity  10. BMI 37.0-37.9, adult Connie Carlson is currently in the action stage of change and her goal is to continue with weight loss efforts. I recommend Connie Carlson begin the structured treatment plan as follows:  She has agreed to the Category 2 Plan + 100 calories.  Meal planning was discussed. Reviewed labs with the patient from 02/15/2023, CMP, Alk phos, glucose, and hormonal test. Protein shake handout was given. Medication refill was given. Will start to journal.   Exercise goals: No exercise has been prescribed at this time.   Behavioral modification strategies: increasing lean protein intake, decreasing simple carbohydrates, increasing vegetables, increasing water intake, decreasing eating out, no skipping meals, meal planning and cooking strategies, keeping healthy foods in the home, and planning for success.  She was informed of the importance of frequent follow-up visits to maximize her success with intensive lifestyle modifications for her multiple health conditions. She was informed we would discuss her lab results at her next visit unless there is a critical issue that needs to be addressed sooner. Connie Carlson agreed to keep her next visit at the agreed upon time to discuss these results.  Objective:   Blood pressure 114/73, pulse 69, temperature 99.2 F (37.3 C), height 5\' 1"  (1.549 m), weight 198 lb (89.8 kg), SpO2 98%. Body mass index is 37.41 kg/m.  EKG: Normal sinus rhythm, rate 66 BPM.  Indirect Calorimeter completed today shows a VO2 of 273 and a REE of 1886.  Her calculated  basal metabolic rate is 8119 thus her basal metabolic rate is better than expected.  General: Cooperative, alert, well developed, in no acute distress. HEENT: Conjunctivae and lids unremarkable. Cardiovascular: Regular rhythm.  Lungs: Normal work of breathing. Neurologic: No focal deficits.   Lab Results  Component Value Date   CREATININE 0.87 03/30/2023   BUN 14 03/30/2023   NA 139 03/30/2023   K 4.7 03/30/2023   CL 104 03/30/2023   CO2 22 03/30/2023   Lab Results  Component Value Date   ALT 16 03/30/2023   AST 17 03/30/2023   ALKPHOS 50 03/30/2023   BILITOT 0.2 03/30/2023   Lab Results  Component Value Date   HGBA1C 5.5 03/30/2023   HGBA1C 5.1 04/06/2022   HGBA1C 5.5 06/18/2020   HGBA1C 5.4 03/13/2019   HGBA1C 5.6 05/06/2015   Lab Results  Component Value Date   INSULIN 12.4 03/30/2023   Lab Results  Component Value Date   TSH 1.270 03/30/2023   Lab Results  Component Value Date   CHOL 150 03/30/2023   HDL 53 03/30/2023   LDLCALC 80 03/30/2023   TRIG 93 03/30/2023   CHOLHDL 2.8 02/02/2022   Lab Results  Component Value Date   WBC 7.7 08/25/2022   HGB  12.2 08/25/2022   HCT 37.1 08/25/2022   MCV 91.2 08/25/2022   PLT 290 08/25/2022   Lab Results  Component Value Date   IRON 42 05/08/2015   TIBC 480 (H) 05/08/2015   Attestation Statements:   Reviewed by clinician on day of visit: allergies, medications, problem list, medical history, surgical history, family history, social history, and previous encounter notes.   Trude Mcburney, am acting as Energy manager for [employer], DO.   Time spent on visit including pre-visit chart review and post-visit charting and care was 50 minutes.    I have reviewed the above documentation for accuracy and completeness, and I agree with the above. Corinna Capra, DO

## 2023-04-05 ENCOUNTER — Encounter: Payer: 59 | Admitting: Family Medicine

## 2023-04-05 DIAGNOSIS — Z0000 Encounter for general adult medical examination without abnormal findings: Secondary | ICD-10-CM

## 2023-04-12 ENCOUNTER — Ambulatory Visit: Payer: 59 | Admitting: Nurse Practitioner

## 2023-04-12 ENCOUNTER — Encounter: Payer: Self-pay | Admitting: Nurse Practitioner

## 2023-04-12 VITALS — BP 130/81 | HR 76 | Temp 98.3°F | Ht 61.0 in | Wt 203.0 lb

## 2023-04-12 DIAGNOSIS — Z6838 Body mass index (BMI) 38.0-38.9, adult: Secondary | ICD-10-CM | POA: Diagnosis not present

## 2023-04-12 DIAGNOSIS — I10 Essential (primary) hypertension: Secondary | ICD-10-CM

## 2023-04-12 DIAGNOSIS — E669 Obesity, unspecified: Secondary | ICD-10-CM

## 2023-04-12 NOTE — Progress Notes (Signed)
Office: [phone]  /  Fax: [phone]  WEIGHT SUMMARY AND BIOMETRICS  Weight Lost Since Last Visit: 0lb  Weight Gained Since Last Visit: 5lb   Vitals Temp: 98.3 F (36.8 C) BP: 130/81 Pulse Rate: 76 SpO2: 95 %   Anthropometric Measurements Height: 5\' 1"  (1.549 m) Weight: 203 lb (92.1 kg) BMI (Calculated): 38.38 Weight at Last Visit: 198lb Weight Lost Since Last Visit: 0lb Weight Gained Since Last Visit: 5lb Starting Weight: 198lb Total Weight Loss (lbs): 0 lb (0 kg)   Body Composition  Body Fat %: 45.4 % Fat Mass (lbs): 92.2 lbs Muscle Mass (lbs): 105.4 lbs Total Body Water (lbs): 77.6 lbs Visceral Fat Rating : 12   Other Clinical Data Fasting: No Labs: No Today's Visit #: 2 Starting Date: 03/30/23     HPI  Chief Complaint: OBESITY  Connie Carlson is here to discuss her progress with her obesity treatment plan. She is on the the Category 2 Plan + 100 and states she is following her eating plan approximately 10 % of the time. She states she is exercising 0 minutes 0 days per week.  Interval History:  Since last office visit she has gained 5 pounds.  She is going to the beach next week for her birthday.  She is struggling with stress and stress eating.  She is seeing a therapist once every 2 weeks.  She is not skipping meals. She eating a protein with each meal.  She tends to eat too many carbs.  She is struggling with hunger.  She is drinking water. Denies sugary drinks.    Pharmacotherapy for weight loss: She is not currently taking medications  for medical weight loss.     Previous pharmacotherapy for medical weight loss:  Phentermine and Topamax for migraines.  Stopped Topamax due to side effects of   Bariatric surgery:  Patient has not had bariatric surgery.      Hypertension Hypertension stable.  Medication(s): Diovan HCT 160-12.5 mg.  Denies side effects.   Denies chest pain, palpitations and SOB.  BP Readings from Last 3 Encounters:  04/12/23  130/81  03/30/23 114/73  03/14/23 (!) 145/78   Lab Results  Component Value Date   CREATININE 0.87 03/30/2023   CREATININE 0.78 02/15/2023   CREATININE 0.87 08/25/2022      PHYSICAL EXAM:  Blood pressure 130/81, pulse 76, temperature 98.3 F (36.8 C), height 5\' 1"  (1.549 m), weight 203 lb (92.1 kg), SpO2 95%. Body mass index is 38.36 kg/m.  General: She is overweight, cooperative, alert, well developed, and in no acute distress. PSYCH: Has normal mood, affect and thought process.   Extremities: No edema.  Neurologic: No gross sensory or motor deficits. No tremors or fasciculations noted.    DIAGNOSTIC DATA REVIEWED:  BMET    Component Value Date/Time   NA 139 03/30/2023 0902   K 4.7 03/30/2023 0902   CL 104 03/30/2023 0902   CO2 22 03/30/2023 0902   GLUCOSE 88 03/30/2023 0902   GLUCOSE 96 02/15/2023 0826   BUN 14 03/30/2023 0902   CREATININE 0.87 03/30/2023 0902   CREATININE 0.78 02/15/2023 0826   CALCIUM 9.5 03/30/2023 0902   GFRNONAA 103 10/17/2020 0000   GFRAA 119 10/17/2020 0000   Lab Results  Component Value Date   HGBA1C 5.5 03/30/2023   HGBA1C 5.8 (H) 10/08/2013   Lab Results  Component Value Date   INSULIN 12.4 03/30/2023   Lab Results  Component Value Date   TSH 1.270 03/30/2023  CBC    Component Value Date/Time   WBC 7.7 08/25/2022 1411   RBC 4.07 08/25/2022 1411   HGB 12.2 08/25/2022 1411   HCT 37.1 08/25/2022 1411   PLT 290 08/25/2022 1411   MCV 91.2 08/25/2022 1411   MCH 30.0 08/25/2022 1411   MCHC 32.9 08/25/2022 1411   RDW 12.8 08/25/2022 1411   Iron Studies    Component Value Date/Time   IRON 42 05/08/2015 1632   TIBC 480 (H) 05/08/2015 1632   IRONPCTSAT 9 (L) 05/08/2015 1632   Lipid Panel     Component Value Date/Time   CHOL 150 03/30/2023 0902   TRIG 93 03/30/2023 0902   HDL 53 03/30/2023 0902   CHOLHDL 2.8 02/02/2022 0000   VLDL 19 05/06/2015 0954   LDLCALC 80 03/30/2023 0902   LDLCALC 74 02/02/2022 0000    Hepatic Function Panel     Component Value Date/Time   PROT 6.9 03/30/2023 0902   ALBUMIN 4.1 03/30/2023 0902   AST 17 03/30/2023 0902   ALT 16 03/30/2023 0902   ALKPHOS 50 03/30/2023 0902   BILITOT 0.2 03/30/2023 0902      Component Value Date/Time   TSH 1.270 03/30/2023 0902   Nutritional Lab Results  Component Value Date   VD25OH 41.9 03/30/2023   VD25OH 26 (L) 01/12/2012     ASSESSMENT AND PLAN  TREATMENT PLAN FOR OBESITY:  Recommended Dietary Goals  Connie Carlson is currently in the action stage of change. As such, her goal is to continue weight management plan. She has agreed to the Category 2 Plan or to start tracking.  Will re discuss again at next visit.  Needs to discuss stress eating with therapist.   Behavioral Intervention  We discussed the following Behavioral Modification Strategies today: increasing lean protein intake, decreasing simple carbohydrates , increasing vegetables, increasing lower glycemic fruits, increasing fiber rich foods, avoiding skipping meals, increasing water intake, reading food labels , continue to practice mindfulness when eating, and planning for success.  Additional resources provided today: NA  Recommended Physical Activity Goals  Connie Carlson has been advised to work up to 150 minutes of moderate intensity aerobic activity a week and strengthening exercises 2-3 times per week for cardiovascular health, weight loss maintenance and preservation of muscle mass.   She has agreed to Think about ways to increase daily physical activity and overcoming barriers to exercise and Increase physical activity in their day and reduce sedentary time (increase NEAT).   Pharmacotherapy We discussed various medication options to help Connie Carlson with her weight loss efforts and we both agreed to consider her opitons.  ASSOCIATED CONDITIONS ADDRESSED TODAY  Action/Plan  Essential hypertension Continue to follow up with PCP.  Continue meds as  directed.  Generalized obesity  BMI 38.0-38.9,adult    Options discussed today: Topamax Wellbutrin GLP-1 based upon coverage.   Labs reviewed in chart with patient from 03/30/23   Return in about 3 weeks (around 05/03/2023).Marland Kitchen She was informed of the importance of frequent follow up visits to maximize her success with intensive lifestyle modifications for her multiple health conditions.   ATTESTASTION STATEMENTS:  Reviewed by clinician on day of visit: allergies, medications, problem list, medical history, surgical history, family history, social history, and previous encounter notes.   Time spent on visit including pre-visit chart review and post-visit care and charting was 30 minutes.    Theodis Sato. Red Mandt FNP-C

## 2023-04-12 NOTE — Patient Instructions (Signed)
Steps to starting your WegovyT  The office staff will send a prior authorization request to your insurance company for approval. We will send you a mychart message once we hear back from your insurance with a decision.  This can take up to 7-10 business days.   Once your WegovyTis approved, you may then pick up Wegovy pen from your pharmacy.    Learn how to do Wegovy injections on the Wegovy.com website. There is a training video that will walk you through how to safely perform the injection. If you have questions for our clinical staff, please contact our  clinical staff. If you have any symptoms of allergic reaction to WegovyT discontinue immediately and call 911.  1. What should I tell my provider before using WegovyT ? have or have had problems with your pancreas or kidneys. have type 2 diabetes and a history of diabetic retinopathy. have or have had depression, suicidal thoughts, or mental health issues. are pregnant or plan to become pregnant. WegovyT may harm your unborn baby. You should stop using WegovyT 3 months before you plan to become pregnant or if you are breastfeeding or plan to breastfeed. It is not known if WegovyT passes into your breast milk.  2. What is WegovyT and how does it work?  WegovyT is an injectable prescription medication prescribed by your provider to help with your weight loss.  This medicine will be most effective when combined with a reduced calorie diet and physical activity.  WegovyT is not for the treatment of type 2 diabetes mellitus. WegovyT should not be used with other GLP-1 receptor agonist medicines. The addition of WegovyT in  patients treated with insulin has not been evaluated. When initiating WegovyT, consider reducing the dose of concomitantly administered insulin secretagogues (such as sulfonylureas) or insulin to reduce the risk of  hypoglycemia.  One role of GLP-1 is to send a signal to your brain to tell it you are full. It also slows down  stomach emptying which will make you feel full longer and may help with reducing cravings.   3.  How should I take WegovyT?  Administer WegovyT once weekly, on the same day each week, at any time of day, with or without meals Inject subcutaneously in the abdomen, thigh or upper arm Initiate at 0.25 mg once weekly for 4 weeks. In 4 week intervals, increase the dose until a dose of 2.4 mg is reached (we will discuss with you the dosage at each visit). The maintenance dose of WegovyT is 2.4 mg once weekly.  The dosing schedule of Wegovy is:  0.25 mg per week X 4 weeks 0.5 mg per week X 4 weeks 1.0 mg per week X 4 weeks 1.7 mg per week X 4 weeks 2.4 mg per week   Missed dose   If you miss your injection day, go ahead inject your current dose. You can go >7 days, but not <7 days between injections. You may change your injection day (It must be >7 days). If you miss >2 doses, you can still keep next injection dose the same or follow de-escalation schedule which may minimize GI symptoms.   In patients with type 2 diabetes, monitor blood glucose prior to starting and during WEGOVYT treatment.   Inject your dose of Wegovy under the skin (subcutaneous injection) in your stomach area (abdomen), upper leg (thigh) or upper arm. Do not inject into a vein or a muscle. The injection site should be rotated and not given in the   same spot each day. Hold the needle under the skin and count to "10". This will allow all of the medicine to be dispensed under the skin. Always wipe your skin with an alcohol prep pad before injection  Dispose of used pen in an approved sharps container. More practical options that can be put in the trash  to go to the landfill are milk jugs or plastic laundry detergent containers with a screw on lid.  What side effects may I notice from taking WegovyT?  Side effects that usually do not require medical attention (report to our office if they continue or are bothersome): Nausea  (most common but decreases over time in most people as their body gets used to the medicine) Diarrhea Constipation (you may take an over the counter laxative if needed) Headache Decreased appetite Upset stomach Tiredness Dizziness Feeling bloated Hair loss Belching Gas Heartburn  Side effects that you should call 911 as soon as possible Vomiting Stomach pain Fever Yellowing of your skin or eyes  Clay-colored stools Increased heart rate while at rest Low blood sugar  Sudden changes in mood, behaviors, thoughts, feelings, or thoughts of suicide If you get a lump or swelling in your neck, hoarseness, trouble swallowing, or shortness of breath. Allergic reaction such as skin rash, itching, hives, swelling of the face, tongue, or lips  Helpful tips for managing nausea Nausea is a common side effect when first starting WegovyT. If you experience nausea, be sure to connect with your health care provider. He or she will offer guidance on ways to manage it, which may include: Eat bland, low-fat foods, like crackers, toast and rice  Eat foods that contain water, like soups and gelatin  Avoid lying down after you eat  Go outdoors for fresh air  Eat more slowly    Other important information Do not drop your pen or knock it against hard surfaces  Do not expose your pen to any liquids  If you think that your pen may be damaged, do not try to fix it. Use a new one Keep the pen cap on until you are ready to inject. Your pen will no longer be sterile if you store an unused pen without the cap, if you pull the pen cap off and put it on again, or if the pen cap is missing. This could lead to an infection  Store the WegovyT pen in the refrigerator from 36F to 46F (2C to 8C) If needed, before removing the pen cap, WegovyT can be stored from 8C to 30C (46F to 86F) in the original carton for up to 28 days.  Keep WegovyT in the original carton to protect it from light  Do not freeze   Throw away pen if WegovyT has been frozen, has been exposed to light or temperatures above 86F (30C), or has been out of the refrigerator for 28 days or longer It's important to properly dispose of your used WegovyT pens. Do not throw the pen away in your household trash. Instead, use an FDA-cleared sharps disposable container or a sturdy household container with a tight-fitting lid, like a heavy duty plastic container.   Wegovy pen training website: https://www.wegovy.com/about-wegovy/how-to-use-the-wegovy-pen.html  Wegovy savings and support link: https://www.wegovy.com/saving-and-support/save-and-support.html    What is a GLP-1 Glucagon like peptide-1 (GLP-1) agonists represent a class of medications used to treat type 2 diabetes mellitus and obesity.  GLP-1 medications mimic the action of a hormone called glucagon like peptide 1.  When blood sugar levels start to   rise/increase these drugs stimulate the body to produce more insulin.  When that happens, the extra insulin helps to lower the blood sugar levels in the body.  This in returns helps with decreasing cravings.  These medications also slow the movement of food from the stomach into the small intestine.  This in return helps one to full faster and longer.   Diabetic medications: Approved for treatment of diabetes mellitus but does not have full approval for weight loss use Victoza (liraglutide) Ozempic (semaglutide) Mounjaro Trulicity Rybelsus  Weight loss medications: Approved for long-term weight loss use.        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  Other possible side effects are headaches, belching, diarrhea, tiredness (fatigue), vomiting, upset stomach, dizziness, heartburn and stomach (abdominal pain).  If you think that you are becoming dehydrated, please inform our office or your primary family provider.  Stop immediately and go to ER if you have any symptoms of a serious allergic reaction including swelling of your face, lips, tongue or throat; problems breathing or swallowing; severe rash or itching; fainting or feeling dizzy; or very rapid heart rate.                                                                                             

## 2023-04-14 ENCOUNTER — Telehealth (HOSPITAL_BASED_OUTPATIENT_CLINIC_OR_DEPARTMENT_OTHER): Payer: Self-pay

## 2023-04-15 ENCOUNTER — Encounter: Payer: Self-pay | Admitting: Professional

## 2023-04-15 ENCOUNTER — Ambulatory Visit (INDEPENDENT_AMBULATORY_CARE_PROVIDER_SITE_OTHER): Payer: 59 | Admitting: Professional

## 2023-04-15 DIAGNOSIS — F330 Major depressive disorder, recurrent, mild: Secondary | ICD-10-CM

## 2023-04-15 DIAGNOSIS — F4322 Adjustment disorder with anxiety: Secondary | ICD-10-CM | POA: Diagnosis not present

## 2023-04-15 NOTE — Progress Notes (Signed)
[facility] Counselor/Therapist Progress Note  Patient ID: Connie Carlson, MRN: [id number],    Prefers to go by Connie Carlson, her middle name  Date: 04/15/2023  Time Spent:  50 minutes [id number]   Treatment Type: Individual Therapy  Risk Assessment: Danger to Self:  No Self-injurious Behavior: No Danger to Others: No  Subjective: The patient arrived late for her inperson appointment.   Issues addressed: 1-pt has seen NP Irene Limbo three times through [facility] -Ms. Tickerhoff has suggested that she address emotional eating in therapy -educated -examples -HAALT-B worksheet -importance of being mindful and intentional about eating "experiences" 2-coping with anxiety -pt has verbalized increased issues with anxiety causing her to feel stuck -pt reports she notices that her breathing has been compromised but was recently evaluated and it is not a physical issue -educated and practiced proper breathing -educated on Progressive Muscle Relaxation (PMR) -walked pt through PMR during session -pt acknowledges the helpfulness of this technique  Treatment Plan Problems Addressed  Career Success Obstacles, Depression, Low Self-Esteem/Lack of Assertiveness, Partner Relational Problems  Goals 1. Develop a sense of own personal power and self-esteem within the relationship. 2. Develop advocacy and empowerment skills that involve taking a proactive stance in the work environment. 3. Develop assertiveness skills and techniques for application in work-related activities (e.g., establishing boundaries with coworkers, Garment/textile technologist). 4. Develop healthy cognitive mechanisms to facilitate positive attitudes and beliefs about self within the context of one's environment to mitigate depressive symptoms. Objective Articulate signs and symptoms of depression in current life experiences. Target Date: 2023-10-07 Frequency: Biweekly  Progress: 0 Modality: individual   Related Interventions Encourage the client to share her feelings of depression to gain an insight into precipitating events and implications of symptoms; normalize her feelings of depression. Objective Implement behavioral interventions to overcome depression. Target Date: 2023-10-07 Frequency: Biweekly  Progress: 0 Modality: individual  Related Interventions Assign the client to participate as fully as possible in a healthy exercise regimen. Encourage the client to continue participation in positive social support systems. Objective Identify and replace cognitive self-talk that supports depression. Target Date: 2023-10-07 Frequency: Biweekly  Progress: 0 Modality: individual  Related Interventions Reinforce the client's positive, reality-based cognitive messages that enhance self-confidence and increase adaptive action (see "Positive Self-Talk" in the Adult Psychotherapy Homework Planner, 2nd ed. by Stephannie Li). Do "behavioral experiments" in which depressive automatic thoughts are treated as hypotheses/predictions, reality-based alternative hypotheses/ predictions are generated, and both are tested against the client's past, present, and/or future experiences. Encourage the client to discuss cognitive distortions, including automatic thoughts (e.g., negative view of self, future, experience) and negative schemas (e.g., core beliefs about self and others based on earlier childhood experiences); assess frequency of negative self-statements associated with depression. Objective Increase the frequency of engaging in pleasant activities. Target Date: 2023-10-07 Frequency: Biweekly  Progress: 0 Modality: individual  Related Interventions Assign the client reading materials regarding overcoming depression for women (e.g., Women and Depression: A Practical Self-Help Guide by Allyne Gee; Feeling Good by Lawerance Bach; Women & Depression by Collier Bullock; Silencing the Self: Women and Depression by  Ree Kida). Objective Identify precipitating events and factors of depression, particularly roles of self and others in depression. Target Date: 2023-10-07 Frequency: Biweekly  Progress: 0 Modality: individual  Related Interventions Explore the degree to which primary symptoms of depression are a result of gender role socialization (e.g., passivity, decreased self-esteem, learned helplessness, interpersonal orientation towards pleasing others). Evaluate symptoms to consider the presence of culture-bound syndromes and/or culture specificity of symptoms (  e.g., ataques de nervios, amok, postemigration effects). Objective Increase the level of physical exercise. Target Date: 2023-10-07 Frequency: Biweekly  Progress: 0 Modality: individual  Objective Increase social contacts and communicate needs within existing interpersonal relationships. Target Date: 2023-10-07 Frequency: Biweekly  Progress: 0 Modality: individual  5. Explore career options that have been automatically ruled out due to low self-concept or limited opportunities. Objective Identify and replace at least three negative self-talk statements that perpetuate feelings of powerlessness or hopelessness. Target Date: 2023-10-07 Frequency: Biweekly  Progress: 0 Modality: individual  Objective Identify and replace cognitive self-talk that contributes to all-or-nothing thinking, gender-role stereotypes, or low career self-efficacy. Target Date: 2023-10-07 Frequency: Biweekly  Progress: 0 Modality: individual  Objective Develop career-related strategies to overcome and persist in the face of obstacles. Target Date: 2023-10-07 Frequency: Biweekly  Progress: 0 Modality: individual  Objective Increase self-esteem and self-concept by exploring and encompassing other roles/identities besides those of career. Target Date: 2023-10-07 Frequency: Biweekly  Progress: 0 Modality: individual  Objective Identify barriers and personal benefits to  balancing career and family life. Target Date: 2023-10-07 Frequency: Biweekly  Progress: 0 Modality: individual  6. Identify and increase intrapersonal, interpersonal, and physical resources to foster positive coping strategies. 7. Improve depressed mood to maximize effective social, occupational, and physical functioning. 8. Improve self-esteem and develop a positive self-image as capable and competent. 9. Increase ability to express needs and desires openly and honestly. 10. Increase assertiveness skills and ability to advocate for self. 11. Increase awareness of own role in relationship conflicts. Objective Identify a pattern of repeatedly forming destructive intimate relationships. Target Date: 2023-10-07 Frequency: Biweekly  Progress: 0 Modality: individual  12. Increase involvement in activities that foster confidence and a sense of accomplishment. Objective Acknowledge self-disparaging statements and recognize the tendency to engage in such statements. Target Date: 2023-10-07 Frequency: Biweekly  Progress: 0 Modality: individual  Related Interventions Ask the client to complete and process an exercise in the book Ten Days to Self-Esteem! Lawerance Bach). Objective Verbalize an understanding of the differences between passive, assertive, and aggressive behavior. Target Date: 2023-10-07 Frequency: Biweekly  Progress: 0 Modality: individual  Objective Describe personal history of self-devaluation and lack of assertiveness. Target Date: 2023-10-07 Frequency: Biweekly  Progress: 0 Modality: individual  Related Interventions Use cognitive-restructuring techniques to change the client's belief system (i.e., via reality-testing and rational thought) toward a more positive, realistic self-perception. Assist the client in becoming aware of how she indirectly expresses negative feelings about self (e.g., lack of eye contact, social withdrawal, sweating, expectations of failure or  rejection). Assist the client in identifying negative beliefs about herself. Discuss context of self-disparagement, including client's perceptions of self from childhood to present, role of caregivers and peers, and related experiences. Objective Identify three roadblocks to improved self-esteem. Target Date: 2023-10-07 Frequency: Biweekly  Progress: 0 Modality: individual  Objective Practice saying "no" to at least one person, declining to comply with a request/favor or identify and assert rights, needs, and wants. Target Date: 2023-10-07 Frequency: Biweekly  Progress: 0 Modality: individual  Objective Implement at least one decision based on own beliefs and values. Target Date: 2023-10-07 Frequency: Biweekly  Progress: 0 Modality: individual  13. Learn and implement various coping skills for dealing with work- and career-related inequalities. 14. Reduce self-disparaging remarks and negative self-talk.   Diagnosis:Major depressive disorder, recurrent episode, mild (HCC)  Adjustment disorder with anxiety  Plan: -work on breathing, PMR, and mindfulness in eating -meet again on Friday, April 28, 2023 at 8am in person

## 2023-04-27 ENCOUNTER — Ambulatory Visit (HOSPITAL_BASED_OUTPATIENT_CLINIC_OR_DEPARTMENT_OTHER): Payer: 59

## 2023-04-29 ENCOUNTER — Ambulatory Visit: Payer: 59 | Admitting: Professional

## 2023-05-03 ENCOUNTER — Ambulatory Visit: Payer: 59 | Admitting: Nurse Practitioner

## 2023-05-11 ENCOUNTER — Telehealth (HOSPITAL_BASED_OUTPATIENT_CLINIC_OR_DEPARTMENT_OTHER): Payer: Self-pay

## 2023-05-11 ENCOUNTER — Ambulatory Visit (HOSPITAL_BASED_OUTPATIENT_CLINIC_OR_DEPARTMENT_OTHER): Admission: RE | Admit: 2023-05-11 | Payer: 59 | Source: Ambulatory Visit

## 2023-05-13 ENCOUNTER — Ambulatory Visit: Payer: 59 | Admitting: Professional

## 2023-05-16 ENCOUNTER — Telehealth: Payer: Self-pay | Admitting: Family Medicine

## 2023-05-16 NOTE — Telephone Encounter (Signed)
Patient called she is requesting topamax 50mg  for migraines she thinks it works better than Ajovy 225mg /1.69ml please advise  [employer]  [phone]

## 2023-05-18 ENCOUNTER — Other Ambulatory Visit: Payer: Self-pay | Admitting: Family Medicine

## 2023-05-18 DIAGNOSIS — G43009 Migraine without aura, not intractable, without status migrainosus: Secondary | ICD-10-CM

## 2023-05-19 ENCOUNTER — Ambulatory Visit: Payer: 59 | Admitting: Obstetrics and Gynecology

## 2023-05-19 ENCOUNTER — Encounter: Payer: Self-pay | Admitting: Obstetrics and Gynecology

## 2023-05-19 VITALS — BP 124/84 | HR 73 | Ht 61.0 in | Wt 216.0 lb

## 2023-05-19 DIAGNOSIS — G8929 Other chronic pain: Secondary | ICD-10-CM | POA: Diagnosis not present

## 2023-05-19 DIAGNOSIS — M6289 Other specified disorders of muscle: Secondary | ICD-10-CM | POA: Diagnosis not present

## 2023-05-19 DIAGNOSIS — R102 Pelvic and perineal pain: Secondary | ICD-10-CM

## 2023-05-19 MED ORDER — CYCLOBENZAPRINE HCL 10 MG PO TABS: 10.0000 mg | ORAL_TABLET | Freq: Three times a day (TID) | ORAL | 0 refills | Status: DC | PRN

## 2023-05-19 NOTE — Progress Notes (Signed)
RETURN GYNECOLOGY VISIT  Subjective:  Connie Carlson is a 42 y.o. G1P1001 with LMP 05/04/23 presenting for follow up of pelvic pain and irregular periods  Saw Dr. Penne Lash around 1 year ago with several months of lower abdominal/pelvic pain and irregular periods. Had tried COCs in the past without relief. Tried provera with improvement in her pain & bleeding. Work up included EMB (benign) and pelvic US w/ 4mm hyperechoic nodule in the upper uterine segment. Repeat US showed resolution of the nodule. Pt ultimately discontinued her COCs (hx HTN).   Today, pt reports persistent irregular periods and pelvic pain. Pain radiates to her back. Reports that her pain occurs 3-4x per week. It is worse with bending and lifting. 2 months ago she had an episode of severe pain after intercourse where she felt she couldn't move. Continues to have pain with intercourse and decreased sexual desire. Denies bowel/bladder dysfunction. Ltd relief with OTC meds.  I personally reviewed the following: - EMB 04/26/22 - benign inactive endometrium with hormone effect - GC/CT/trich 04/26/22 negative - TSH 1.27 on 03/30/23 - Estradiol <15 on 02/15/23 - Pelvic US 05/10/22 - Pelvic US 08/03/22 - Pap 09/2020 NILM/HPV neg  Past Medical History:  Diagnosis Date   High blood pressure    Past Surgical History:  Procedure Laterality Date   CHOLECYSTECTOMY     mole remove     under right breast by [facility] - skin cancer   Current Outpatient Medications on File Prior to Visit  Medication Sig Dispense Refill   topiramate (TOPAMAX) 50 MG tablet TAKE ONE TABLET BY MOUTH TWICE DAILY (Patient not taking: Reported on 05/19/2023) 180 tablet 0   valsartan-hydrochlorothiazide (DIOVAN-HCT) 160-12.5 MG tablet TAKE ONE TABLET BY MOUTH ONE TIME DAILY (Patient not taking: Reported on 05/19/2023) 90 tablet 0   No current facility-administered medications on file prior to visit.   OB History     Gravida  1   Para  1   Term  1    Preterm      AB      Living  1      SAB      IAB      Ectopic      Multiple      Live Births             Social History   Socioeconomic History   Marital status: Married    Spouse name: Not on file   Number of children: 1   Years of education: Not on file   Highest education level: Not on file  Occupational History   Not on file  Tobacco Use   Smoking status: Never   Smokeless tobacco: Never  Vaping Use   Vaping status: Never Used  Substance and Sexual Activity   Alcohol use: No   Drug use: No   Sexual activity: Yes    Partners: Male    Birth control/protection: Pill  Other Topics Concern   Not on file  Social History Narrative   Not on file   Social Determinants of Health   Financial Resource Strain: Not on file  Food Insecurity: Not on file  Transportation Needs: Not on file  Physical Activity: Not on file  Stress: Not on file  Social Connections: Unknown (01/18/2022)   Received from [facility]   Social Network    Social Network: Not on file  Intimate Partner Violence: Unknown (12/23/2021)   Received [facility]   HITS  Physically Hurt: Not on file    Insult or Talk Down To: Not on file    Threaten Physical Harm: Not on file    Scream or Curse: Not on file     Objective:   Vitals:   05/19/23 1338  BP: 124/84  Pulse: 73  Weight: 216 lb (98 kg)  Height: 5\' 1"  (1.549 m)   General:  Alert, oriented and cooperative. Patient is in no acute distress.  Skin: Skin is warm and dry. No rash noted.   Cardiovascular: Normal heart rate noted  Respiratory: Normal respiratory effort, no problems with respiration noted  Abdomen: Soft, non-tender, non-distended    Assessment and Plan:  Connie Carlson is a 42 y.o. with chronic pelvic pain, likely related to pelvic floor dysfunction  Chronic pelvic pain in female Pelvic floor dysfunction Discussed irregular bleeding is likely related to menopause transition. Encouraged pt to track  cycles/bleeding to have better understanding of her symptoms.  Reviewed that her history and prior testing is most consistent with myofascial pain due to pelvic floor dysfunction. Discussed treatment will involve PFPT with internal component - pt agreeable to trying.  Discussed option for medication for muscle spasms but discussed that this is really only to use for an extreme pain flare and that she needs to avoid driving/operating machinery while taking this medication due to sedating effects. Rx sent for flexeril to bridge pt to PFPT -     Ambulatory referral to Physical Therapy -     cyclobenzaprine (FLEXERIL) 10 MG tablet; Take 1 tablet (10 mg total) by mouth every 8 (eight) hours as needed for muscle spasms.  Return in about 3 months (around 08/19/2023) for follow up pain in 3-6 months.  Future Appointments  Date Time Provider Department Center  05/20/2023  2:40 PM Agapito Games, MD [facility] None  05/27/2023  8:00 AM Teofilo Pod, [facility] None  05/31/2023  9:45 AM Irene Limbo, FNP [facility] None  07/08/2023  8:00 AM Teofilo Pod, [facility] None   Lennart Pall, MD

## 2023-05-19 NOTE — Telephone Encounter (Signed)
Medication sent to pts pharmacy 

## 2023-05-19 NOTE — Addendum Note (Signed)
Addended by: Deno Etienne on: 05/19/2023 11:40 AM   Modules accepted: Orders

## 2023-05-20 ENCOUNTER — Ambulatory Visit: Payer: 59 | Admitting: Family Medicine

## 2023-05-27 ENCOUNTER — Encounter: Payer: Self-pay | Admitting: Professional

## 2023-05-27 ENCOUNTER — Ambulatory Visit (INDEPENDENT_AMBULATORY_CARE_PROVIDER_SITE_OTHER): Payer: 59 | Admitting: Professional

## 2023-05-27 DIAGNOSIS — F4322 Adjustment disorder with anxiety: Secondary | ICD-10-CM | POA: Diagnosis not present

## 2023-05-27 DIAGNOSIS — F331 Major depressive disorder, recurrent, moderate: Secondary | ICD-10-CM | POA: Diagnosis not present
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
## 2023-05-31 ENCOUNTER — Encounter: Payer: Self-pay | Admitting: Nurse Practitioner

## 2023-05-31 ENCOUNTER — Ambulatory Visit: Payer: 59 | Admitting: Nurse Practitioner

## 2023-05-31 VITALS — BP 136/84 | HR 90 | Temp 98.9°F | Ht 61.0 in | Wt 210.0 lb

## 2023-05-31 DIAGNOSIS — E669 Obesity, unspecified: Secondary | ICD-10-CM | POA: Diagnosis not present

## 2023-05-31 DIAGNOSIS — Z6839 Body mass index (BMI) 39.0-39.9, adult: Secondary | ICD-10-CM | POA: Diagnosis not present

## 2023-05-31 DIAGNOSIS — I10 Essential (primary) hypertension: Secondary | ICD-10-CM

## 2023-05-31 MED ORDER — CONTRAVE 8-90 MG PO TB12: ORAL_TABLET | ORAL | 0 refills | Status: DC

## 2023-05-31 NOTE — Progress Notes (Signed)
Office: [phone]  /  Fax: [phone]  WEIGHT SUMMARY AND BIOMETRICS  Weight Lost Since Last Visit: 0  Weight Gained Since Last Visit: 7 lb   Vitals Temp: 98.9 F (37.2 C) BP: 136/84 Pulse Rate: 90 SpO2: 98 %   Anthropometric Measurements Height: 5\' 1"  (1.549 m) Weight: 210 lb (95.3 kg) BMI (Calculated): 39.7 Weight at Last Visit: 203 lb Weight Lost Since Last Visit: 0 Weight Gained Since Last Visit: 7 lb Starting Weight: 198 lb Total Weight Loss (lbs): 0 lb (0 kg) Peak Weight: 242 lb   Body Composition  Body Fat %: 45.6 % Fat Mass (lbs): 95.8 lbs Muscle Mass (lbs): 108.6 lbs Total Body Water (lbs): 76.2 lbs Visceral Fat Rating : 12   Other Clinical Data Fasting: no Labs: no Today's Visit #: 3 Starting Date: 03/30/23     HPI  Chief Complaint: OBESITY  Aleyia is here to discuss her progress with her obesity treatment plan. She is on the the Category 2 Plan and states she is following her eating plan approximately 40 % of the time. She states she is walking exercising 20 minutes 2 days per week.   Interval History:  Since last office visit she has gained 7 pounds.  She has been seeing a therapist on a regular basis. She is struggling with stress eating.  She eats to the point that "my stomach hurts".  She has been taking Phentermine for the past week-given to her by her sister.  Reports side effects of insomnia and dizziness.   She has gotten on track but started following the meal plan over the past week.    Pharmacotherapy for weight loss: She is not currently taking medications  for medical weight loss.      Previous pharmacotherapy for medical weight loss:  Phentermine   Last visit we discussed: Retrying Topamax for food impulse control and cravings-took Topamax in the past for migraines.  Was restarted back on Topamax 50mg  daily for migraines by PCP in August-she doesn't feel that her anxiety has gotten worse since starting Topamax.   Wellbutrin  for cravings GLP-1 based upon coverage-not covered by insurance  Bariatric surgery:  Patient has not had bariatric surgery.   Hypertension Hypertension stable.  Medication(s): Diovan HCT 160-12.5mg .  denies side effects.  Denies chest pain, palpitations and SOB.  BP Readings from Last 3 Encounters:  05/31/23 136/84  05/19/23 124/84  04/12/23 130/81   Lab Results  Component Value Date   CREATININE 0.87 03/30/2023   CREATININE 0.78 02/15/2023   CREATININE 0.87 08/25/2022      PHYSICAL EXAM:  Blood pressure 136/84, pulse 90, temperature 98.9 F (37.2 C), height 5\' 1"  (1.549 m), weight 210 lb (95.3 kg), last menstrual period 05/04/2023, SpO2 98%. Body mass index is 39.68 kg/m.  General: She is overweight, cooperative, alert, well developed, and in no acute distress. PSYCH: Has normal mood, affect and thought process.   Extremities: No edema.  Neurologic: No gross sensory or motor deficits. No tremors or fasciculations noted.    DIAGNOSTIC DATA REVIEWED:  BMET    Component Value Date/Time   NA 139 03/30/2023 0902   K 4.7 03/30/2023 0902   CL 104 03/30/2023 0902   CO2 22 03/30/2023 0902   GLUCOSE 88 03/30/2023 0902   GLUCOSE 96 02/15/2023 0826   BUN 14 03/30/2023 0902   CREATININE 0.87 03/30/2023 0902   CREATININE 0.78 02/15/2023 0826   CALCIUM 9.5 03/30/2023 0902   GFRNONAA 103 10/17/2020 0000  GFRAA 119 10/17/2020 0000   Lab Results  Component Value Date   HGBA1C 5.5 03/30/2023   HGBA1C 5.8 (H) 10/08/2013   Lab Results  Component Value Date   INSULIN 12.4 03/30/2023   Lab Results  Component Value Date   TSH 1.270 03/30/2023   CBC    Component Value Date/Time   WBC 7.7 08/25/2022 1411   RBC 4.07 08/25/2022 1411   HGB 12.2 08/25/2022 1411   HCT 37.1 08/25/2022 1411   PLT 290 08/25/2022 1411   MCV 91.2 08/25/2022 1411   MCH 30.0 08/25/2022 1411   MCHC 32.9 08/25/2022 1411   RDW 12.8 08/25/2022 1411   Iron Studies    Component Value Date/Time    IRON 42 05/08/2015 1632   TIBC 480 (H) 05/08/2015 1632   IRONPCTSAT 9 (L) 05/08/2015 1632   Lipid Panel     Component Value Date/Time   CHOL 150 03/30/2023 0902   TRIG 93 03/30/2023 0902   HDL 53 03/30/2023 0902   CHOLHDL 2.8 02/02/2022 0000   VLDL 19 05/06/2015 0954   LDLCALC 80 03/30/2023 0902   LDLCALC 74 02/02/2022 0000   Hepatic Function Panel     Component Value Date/Time   PROT 6.9 03/30/2023 0902   ALBUMIN 4.1 03/30/2023 0902   AST 17 03/30/2023 0902   ALT 16 03/30/2023 0902   ALKPHOS 50 03/30/2023 0902   BILITOT 0.2 03/30/2023 0902      Component Value Date/Time   TSH 1.270 03/30/2023 0902   Nutritional Lab Results  Component Value Date   VD25OH 41.9 03/30/2023   VD25OH 26 (L) 01/12/2012     ASSESSMENT AND PLAN  TREATMENT PLAN FOR OBESITY:  Recommended Dietary Goals  Tristian is currently in the action stage of change. As such, her goal is to continue weight management plan. She has agreed to keeping a food journal and adhering to recommended goals of 1200-1400 calories and 75+ grams  protein.  Behavioral Intervention  We discussed the following Behavioral Modification Strategies today: increasing lean protein intake, decreasing simple carbohydrates , increasing vegetables, increasing lower glycemic fruits, increasing water intake, work on tracking and journaling calories using tracking application, continue to practice mindfulness when eating, and planning for success.  Additional resources provided today: NA  Recommended Physical Activity Goals  Ieasha has been advised to work up to 150 minutes of moderate intensity aerobic activity a week and strengthening exercises 2-3 times per week for cardiovascular health, weight loss maintenance and preservation of muscle mass.   She has agreed to Think about ways to increase daily physical activity and overcoming barriers to exercise and Increase physical activity in their day and reduce sedentary time (increase  NEAT).   Pharmacotherapy We discussed various medication options to help Danne with her weight loss efforts and we both agreed to stop Phentermine and start Contrave.  Start side effects.  Patient is taking BCP.  Discussed the importance of not getting pregnant especially while taking Topamax.    ASSOCIATED CONDITIONS ADDRESSED TODAY  Action/Plan  Essential hypertension Continue to follow up with PCP.  Continue meds as directed  Generalized obesity -     start Contrave; Start 1 tablet every morning for 7 days, then 1 tablet twice daily for 7 days, then 2 tablets every morning and one every evening  Dispense: 120 tablet; Refill: 0. Side effects discussed.   BMI 39.0-39.9,adult         Return in about 3 weeks (around 06/21/2023).Marland Kitchen She was informed of  the importance of frequent follow up visits to maximize her success with intensive lifestyle modifications for her multiple health conditions.   ATTESTASTION STATEMENTS:  Reviewed by clinician on day of visit: allergies, medications, problem list, medical history, surgical history, family history, social history, and previous encounter notes.     Theodis Sato. Alexandros Ewan FNP-C

## 2023-05-31 NOTE — Patient Instructions (Addendum)
Why [facility] Does Not Prescribe Compounded GLP-1 Drugs  1. Lack of FDA Approval  - Compounded GLP-1 drugs are not approved by the FDA. This means they haven't gone through the rigorous testing for safety, efficacy, and quality that approved medications have.  2. Quality and Consistency Issues  - Compounded drugs may vary in quality and potency. This inconsistency can lead to ineffective treatment or increased risk of serious side effects.  3. Safety Concerns  - Without FDA oversight, compounded drugs may contain impurities or incorrect dosages, posing serious health risks to patients.  4. Potential for Contamination  - Compounding pharmacies often do not follow the stringent manufacturing processes required for approved drugs, increasing the risk of contamination.  5. Lack of Clinical Trials  - Compounded drugs do not undergo clinical trials to prove their safety and effectiveness, unlike FDA-approved medications.  6. Legal Issues  - If a problem arises with a compounded medication, legal recourse may be limited if adverse effects occur due to the compounded medication.  7. Efficacy Concerns  - The efficacy of compounded GLP-1 drugs is not guaranteed, as they are not subjected to the same rigorous testing and standardization as FDA-approved drugs.  8. Professional Endorsements and Recommendations  - Major medical associations and healthcare professionals generally do not endorse the use of compounded medications when FDA-approved alternatives are available. Steps to starting your start Contrave  The office will send a prior authorization request to your insurance company for approval. We will send you a mychart message once we hear back from your insurance with a decision.  This can take up to 7-10 business days.   Is Contrave an option for me? Do not take Contrave if you:   Have uncontrolled hypertension  Have or have had seizures  Use other medicines that contain bupropion  such as Wellbutrin, Wellbutrin SR, Wellbutrin XL, and Aplenzin.  Have or have had an eating disorder called anorexia (eating very little) or bulimia (eating too much and vomiting to avoid gaining weight)  Are dependent on opioid pain medicines or use medicines to help stop taking opioids such as methadone or buprenorphine, or are in opiate withdrawal  Drink a lot of alcohol and abruptly stop drinking, or use medicines called sedatives (these make you sleepy), benzodiazepines, or anti-seizure medicines and you stop using them all of a sudden  Are taking medicines called monoamine oxidase inhibitors (MAOIs). Ask your healthcare provider or pharmacist if you are not sure if you take an MAOI, including linezoid. Do not start Contrave until you have stopped taking your MAOI for at least 14 days.  Are allergic to naltrexone HCl or bupropion HCl or any of the ingredients in Contrave. See the end of this Medication Guide for a complete list of ingredients in Contrave.  Are pregnant or planning to become pregnant. Tell your healthcare provider right away if you become pregnant while taking CONTRAVE  What is Contrave and how does it work?  Contrave is a prescription only medicine to help with your weight loss. It works on an area of the brain that controls your appetite.  It is a combination of two medicines that are extended release: naltrexone HCL and bupropion HCL.  One of the ingredients in Contrave, bupropion, is the same ingredient in some other medicines used to treat depression and to help people quit smoking. However, Contrave is not approved to treat depression or other mental illnesses, or to help people quit smoking (smoking cessation).   This medicine will be  most effective when combined with a reduced calorie diet and physical activity.  How should I take Contrave?  Take exactly as your provider tells you to. It is an increasing dose according to the following chart:  Contrave How  should I take Contrave?   It is best to take Contrave with food. However, do not take with high-fat meals.   Swallow the extended-release tablet whole. Do not crush, break, or chew it.   If you miss a dose, skip the missed dose and go back to your regular dosing schedule. Do not take extra medicine to make up for a missed dose.  Do not take more than 2 tablets in the morning and 2 tablets in the evening.  Do not take more than 2 tablets at the same time or more than 4 tablets in 1 day.  Do not stop taking Contrave without talking to your provider.   Stopping Contrave suddenly can cause serious side effects, such as seizures.  Swallow Contrave tablets whole. Do not cut, chew, or crush tablets.  Do not take Contrave when eating a high-fat meal. It may increase your risk of seizures.   What should I avoid while taking Contrave?  You should avoid other medicines that contain bupropion such as Wellbutrin, Wellbutrin SR, Wellbutrin XL and Aplenzin.  You should avoid opioid pain medications or medicines to help stop taking opioids such as methadone or buprenorphine. There may be a risk of opioid overdose.  Do not drink alcohol while taking Contrave. If you drink alcohol, talk to your provider before starting Contrave.   Avoid eating a high-fat meal at the same time you take Contrave. It may increase your risk of seizures.   Women who can become pregnant: Use effective birth control (contraception) consistently while taking Contrave. If you miss a menstrual period, STOP Contrave and call our office immediately. Monthly pregnancy tests will be performed at your appointment if indicated.  What side effects may I notice when taking Contrave?  Side effects that usually do not require medical attention (report to our office if they continue or are bothersome): o Nausea o Constipation (you may take over the counter laxative if needed) o Headache o Dry mouth (drink at least 64 oz. of fluid  daily) o Trouble sleeping (insomnia) o Vomiting o Dizziness o Diarrhea   Side effects that you should report to our office as soon as possible: o Seizures: DO NOT take Contrave again o Depression or severe changes in mood o Thoughts about suicide or dying o Feeling anxious, agitated, irritable, restless, or nervous o Slowed breathing and/or shallow breathing o Severe drowsiness o Confusion o Signs of allergic reaction such as rash, itching, hives, fever, swollen lymph glands, painful sores in your mouth or around your eyes, swelling of your lips or tongue, chest pain, or trouble breathing o Increased blood pressure o Increased heart rate or palpitations o Stomach pain lasting more than a few days o Dark urine o Yellowing of the whites of your eyes o Severe tiredness o Sudden changes in vision o Swelling or redness in or around the eye  o Low blood sugar in Type 2 Diabetes.   Other important information  While taking CONTRAVE, you or your family members should pay close attention to any changes, especially sudden changes, in mood, behaviors, thoughts, or feelings and maintain communication with your provider. You will be asked to sign an informed consent prior to starting Contrave.  If another healthcare provider prescribes narcotic pain medication  for you, please call our office immediately for advice regarding Contrave.  Your prescription will be sent electronically to your pharmacy.   Refills will require an office visit

## 2023-06-01 ENCOUNTER — Encounter: Payer: Self-pay | Admitting: Nurse Practitioner

## 2023-06-09 ENCOUNTER — Ambulatory Visit (INDEPENDENT_AMBULATORY_CARE_PROVIDER_SITE_OTHER): Payer: 59 | Admitting: Professional

## 2023-06-09 ENCOUNTER — Ambulatory Visit: Payer: 59 | Admitting: Professional

## 2023-06-09 ENCOUNTER — Encounter: Payer: Self-pay | Admitting: Professional

## 2023-06-09 DIAGNOSIS — F4322 Adjustment disorder with anxiety: Secondary | ICD-10-CM

## 2023-06-09 DIAGNOSIS — F331 Major depressive disorder, recurrent, moderate: Secondary | ICD-10-CM | POA: Diagnosis not present
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
## 2023-06-21 ENCOUNTER — Telehealth (INDEPENDENT_AMBULATORY_CARE_PROVIDER_SITE_OTHER): Payer: 59 | Admitting: Nurse Practitioner

## 2023-06-21 DIAGNOSIS — E669 Obesity, unspecified: Secondary | ICD-10-CM

## 2023-06-21 DIAGNOSIS — R632 Polyphagia: Secondary | ICD-10-CM | POA: Diagnosis not present

## 2023-06-21 DIAGNOSIS — Z6839 Body mass index (BMI) 39.0-39.9, adult: Secondary | ICD-10-CM | POA: Diagnosis not present

## 2023-06-21 MED ORDER — CONTRAVE 8-90 MG PO TB12
ORAL_TABLET | ORAL | 0 refills | Status: DC
Start: 2023-06-21 — End: 2023-08-30

## 2023-06-21 NOTE — Patient Instructions (Signed)

## 2023-06-21 NOTE — Progress Notes (Signed)
Office: [phone]  /  Fax: [phone]  WEIGHT SUMMARY AND BIOMETRICS  TeleHealth Visit:  This visit was completed with telemedicine (audio/video) technology. Opaline has verbally consented to this TeleHealth visit. The patient is located at home, the provider is located at [facility]. The participants in this visit include the listed provider and patient. The visit was conducted today via MyChart video.  HPI  Chief Complaint: OBESITY  Lenix is here via video visit to discuss her progress with her obesity treatment plan. She is on the the Category 2 Plan and states she is following her eating plan approximately 30 % of the time. She states she is not currently exercising.  Interval History:  Patient's didn't weigh today.  She is substituting a protein shake for breakfast and has been trying to eat more protein.  She is not skipping meals.  Reports polyphagia.   Pharmacotherapy for weight loss: She is not currently taking medications  for medical weight loss.  I had prescribed her Contrave after her last visit but she did not start due to cost.  Previous pharmacotherapy for medical weight loss:  Phentermine   PHYSICAL EXAM:  There were no vitals taken for this visit. There is no height or weight on file to calculate BMI.  General: She is overweight, cooperative, alert, well developed, and in no acute distress. PSYCH: Has normal mood, affect and thought process.   Extremities: No edema.  Neurologic: No gross sensory or motor deficits. No tremors or fasciculations noted.    DIAGNOSTIC DATA REVIEWED:  BMET    Component Value Date/Time   NA 139 03/30/2023 0902   K 4.7 03/30/2023 0902   CL 104 03/30/2023 0902   CO2 22 03/30/2023 0902   GLUCOSE 88 03/30/2023 0902   GLUCOSE 96 02/15/2023 0826   BUN 14 03/30/2023 0902   CREATININE 0.87 03/30/2023 0902   CREATININE 0.78 02/15/2023 0826   CALCIUM 9.5 03/30/2023 0902   GFRNONAA 103 10/17/2020 0000    GFRAA 119 10/17/2020 0000   Lab Results  Component Value Date   HGBA1C 5.5 03/30/2023   HGBA1C 5.8 (H) 10/08/2013   Lab Results  Component Value Date   INSULIN 12.4 03/30/2023   Lab Results  Component Value Date   TSH 1.270 03/30/2023   CBC    Component Value Date/Time   WBC 7.7 08/25/2022 1411   RBC 4.07 08/25/2022 1411   HGB 12.2 08/25/2022 1411   HCT 37.1 08/25/2022 1411   PLT 290 08/25/2022 1411   MCV 91.2 08/25/2022 1411   MCH 30.0 08/25/2022 1411   MCHC 32.9 08/25/2022 1411   RDW 12.8 08/25/2022 1411   Iron Studies    Component Value Date/Time   IRON 42 05/08/2015 1632   TIBC 480 (H) 05/08/2015 1632   IRONPCTSAT 9 (L) 05/08/2015 1632   Lipid Panel     Component Value Date/Time   CHOL 150 03/30/2023 0902   TRIG 93 03/30/2023 0902   HDL 53 03/30/2023 0902   CHOLHDL 2.8 02/02/2022 0000   VLDL 19 05/06/2015 0954   LDLCALC 80 03/30/2023 0902   LDLCALC 74 02/02/2022 0000   Hepatic Function Panel     Component Value Date/Time   PROT 6.9 03/30/2023 0902   ALBUMIN 4.1 03/30/2023 0902   AST 17 03/30/2023 0902   ALT 16 03/30/2023 0902   ALKPHOS 50 03/30/2023 0902   BILITOT 0.2 03/30/2023 0902      Component Value Date/Time   TSH 1.270 03/30/2023  1610   Nutritional Lab Results  Component Value Date   VD25OH 41.9 03/30/2023   VD25OH 26 (L) 01/12/2012     ASSESSMENT AND PLAN  TREATMENT PLAN FOR OBESITY:  Recommended Dietary Goals  Quintasha is currently in the action stage of change. As such, her goal is to continue weight management plan. She has agreed to the Category 2 Plan.  Behavioral Intervention  We discussed the following Behavioral Modification Strategies today: increasing lean protein intake, decreasing simple carbohydrates , increasing vegetables, increasing lower glycemic fruits, increasing water intake, work on meal planning and preparation, reading food labels , keeping healthy foods at home, continue to practice mindfulness when eating,  and planning for success.  Additional resources provided today: NA  Recommended Physical Activity Goals  Khyleigh has been advised to work up to 150 minutes of moderate intensity aerobic activity a week and strengthening exercises 2-3 times per week for cardiovascular health, weight loss maintenance and preservation of muscle mass.   She has agreed to Think about ways to increase daily physical activity and overcoming barriers to exercise, Increase physical activity in their day and reduce sedentary time (increase NEAT)., and Work on scheduling and tracking physical activity.    Pharmacotherapy We discussed various medication options to help Dorla with her weight loss efforts and we both agreed to start Contrave. Side effects discussed.  ASSOCIATED CONDITIONS ADDRESSED TODAY  Action/Plan  Polyphagia -     Contrave; Start 1 tablet every morning for 7 days, then 1 tablet twice daily for 7 days, then 2 tablets every morning and one every evening for 7 days and then take 2 tablets po BID  Dispense: 120 tablet; Refill: 0  Generalized obesity -     Contrave; Start 1 tablet every morning for 7 days, then 1 tablet twice daily for 7 days, then 2 tablets every morning and one every evening for 7 days and then take 2 tablets po BID  Dispense: 120 tablet; Refill: 0  BMI 39.0-39.9,adult         Return in about 4 weeks (around 07/19/2023).Marland Kitchen She was informed of the importance of frequent follow up visits to maximize her success with intensive lifestyle modifications for her multiple health conditions.   ATTESTASTION STATEMENTS:  Reviewed by clinician on day of visit: allergies, medications, problem list, medical history, surgical history, family history, social history, and previous encounter notes.     Theodis Sato. Makai Dumond FNP-C

## 2023-06-23 ENCOUNTER — Encounter: Payer: Self-pay | Admitting: Professional

## 2023-06-23 ENCOUNTER — Ambulatory Visit: Payer: 59 | Admitting: Professional

## 2023-06-23 DIAGNOSIS — F331 Major depressive disorder, recurrent, moderate: Secondary | ICD-10-CM | POA: Diagnosis not present

## 2023-06-23 DIAGNOSIS — F4322 Adjustment disorder with anxiety: Secondary | ICD-10-CM
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
## 2023-06-28 ENCOUNTER — Encounter: Payer: Self-pay | Admitting: Family Medicine

## 2023-06-28 ENCOUNTER — Ambulatory Visit: Payer: 59 | Admitting: Family Medicine

## 2023-06-28 VITALS — BP 131/74 | HR 77 | Ht 61.0 in | Wt 217.0 lb

## 2023-06-28 DIAGNOSIS — F330 Major depressive disorder, recurrent, mild: Secondary | ICD-10-CM

## 2023-06-28 DIAGNOSIS — G43009 Migraine without aura, not intractable, without status migrainosus: Secondary | ICD-10-CM | POA: Diagnosis not present

## 2023-06-28 DIAGNOSIS — N921 Excessive and frequent menstruation with irregular cycle: Secondary | ICD-10-CM

## 2023-06-28 DIAGNOSIS — I10 Essential (primary) hypertension: Secondary | ICD-10-CM | POA: Diagnosis not present

## 2023-06-28 MED ORDER — LEVONORGEST-ETH ESTRAD 91-DAY 0.15-0.03 &0.01 MG PO TABS
1.0000 | ORAL_TABLET | Freq: Every day | ORAL | 3 refills | Status: DC
Start: 2023-06-28 — End: 2024-06-21

## 2023-06-28 MED ORDER — ESCITALOPRAM OXALATE 10 MG PO TABS: ORAL_TABLET | ORAL | 1 refills | Status: DC

## 2023-06-28 NOTE — Patient Instructions (Signed)
Please just call the pharmacy and asked them to fill the prescription for the [facility] they should still have it on file.  If they do not then please let us know.

## 2023-06-28 NOTE — Assessment & Plan Note (Signed)
The Emgality approval should still be instated so encouraged her to call the pharmacy to see if they can go ahead and fill it so she can switch off of the Topamax.  Discontinue the Topamax.

## 2023-06-28 NOTE — Assessment & Plan Note (Signed)
PHQ-9 score 14 today and GAD-7 score of 14 today.  We discussed treatment options including medication she is already engaged in routine therapy every 2 weeks.  Will start with Lexapro and then have her follow-up in about 3 to 4 weeks to see how she is doing.  Did discuss potential side effects and concerns.

## 2023-06-28 NOTE — Assessment & Plan Note (Signed)
Most likely be starting Contrave in the next couple of weeks.

## 2023-06-28 NOTE — Assessment & Plan Note (Signed)
Blood pressure overall looks good today we will continue with current regimen she continues to work on [employer] as well

## 2023-06-28 NOTE — Progress Notes (Signed)
Established Patient Office Visit  Subjective   Patient ID: Connie Carlson, female    DOB: Dec 25, 1980  Age: 42 y.o. MRN: [id number]  Chief Complaint  Patient presents with   Migraine   Depression    HPI  F/U Depression -she has been in therapy for almost a year and her therapist who she sees every 2 weeks and really encouraged her to come in to discuss other treatment options.  She is still struggling with feeling down she reports not sleeping well.  She is waking up and having a lot of anxiety around her adult son.  She has no energy she has been tearful a lot of days and just feels like she has no motivation and energy to get up and do things she has to force herself to get out of bed in the mornings and motivate she is also been a lot more nauseated lately.  Has been experiencing a lot more migraines.  We had gotten Emgality approved back in June but she says she never actually started it she said she just kind of felt apathetic about it and so just kept taking the Topamax even though it has not really been helpful or effective.  She has been following with [facility] as well and they are wanting to start her on Contrave.  She wanted to make sure was good to be safe.     ROS    Objective:     BP 131/74   Pulse 77   Ht 5\' 1"  (1.549 m)   Wt 217 lb (98.4 kg)   LMP 06/05/2023 (Exact Date)   SpO2 95%   BMI 41.00 kg/m    Physical Exam Vitals and nursing note reviewed.  Constitutional:      Appearance: Normal appearance.  HENT:     Head: Normocephalic and atraumatic.  Pulmonary:     Effort: Pulmonary effort is normal.  Neurological:     Mental Status: She is alert.  Psychiatric:        Mood and Affect: Mood normal.      No results found for any visits on 06/28/23.    The 10-year ASCVD risk score (Arnett DK, et al., 2019) is: 0.6%    Assessment & Plan:   Problem List Items Addressed This Visit       Cardiovascular and Mediastinum   Migraine  headache without aura - Primary    The Emgality approval should still be instated so encouraged her to call the pharmacy to see if they can go ahead and fill it so she can switch off of the Topamax.  Discontinue the Topamax.      Relevant Medications   escitalopram (LEXAPRO) 10 MG tablet   Hypertension    Blood pressure overall looks good today we will continue with current regimen she continues to work on weight management as well        Other   Severe obesity (BMI 35.0-39.9) with comorbidity (HCC)    Most likely be starting Contrave in the next couple of weeks.      Major depressive disorder, recurrent episode, mild (HCC)    PHQ-9 score 14 today and GAD-7 score of 14 today.  We discussed treatment options including medication she is already engaged in routine therapy every 2 weeks.  Will start with Lexapro and then have her follow-up in about 3 to 4 weeks to see how she is doing.  Did discuss potential side effects and concerns.  Relevant Medications   escitalopram (LEXAPRO) 10 MG tablet   Other Visit Diagnoses     Menorrhagia with irregular cycle       Relevant Medications   Levonorgestrel-Ethinyl Estradiol (SIMPESSE) 0.15-0.03 &0.01 MG tablet       Return in about 24 days (around 07/22/2023) for New start medication.    Nani Gasser, MD

## 2023-07-08 ENCOUNTER — Ambulatory Visit (INDEPENDENT_AMBULATORY_CARE_PROVIDER_SITE_OTHER): Payer: 59 | Admitting: Professional

## 2023-07-08 ENCOUNTER — Encounter: Payer: Self-pay | Admitting: Professional

## 2023-07-08 DIAGNOSIS — F4322 Adjustment disorder with anxiety: Secondary | ICD-10-CM

## 2023-07-08 DIAGNOSIS — F331 Major depressive disorder, recurrent, moderate: Secondary | ICD-10-CM | POA: Diagnosis not present
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
## 2023-07-15 ENCOUNTER — Encounter: Payer: Self-pay | Admitting: Physician Assistant

## 2023-07-15 ENCOUNTER — Ambulatory Visit: Payer: 59 | Admitting: Physician Assistant

## 2023-07-15 VITALS — BP 141/68 | HR 68 | Temp 98.7°F | Ht 61.0 in | Wt 216.5 lb

## 2023-07-15 DIAGNOSIS — J020 Streptococcal pharyngitis: Secondary | ICD-10-CM | POA: Diagnosis not present

## 2023-07-15 DIAGNOSIS — J029 Acute pharyngitis, unspecified: Secondary | ICD-10-CM | POA: Diagnosis not present

## 2023-07-15 DIAGNOSIS — R059 Cough, unspecified: Secondary | ICD-10-CM

## 2023-07-15 LAB — POCT RAPID STREP A (OFFICE): Rapid Strep A Screen: POSITIVE — AB

## 2023-07-15 LAB — POCT INFLUENZA A/B
Influenza A, POC: NEGATIVE
Influenza B, POC: NEGATIVE

## 2023-07-15 LAB — POC COVID19 BINAXNOW: SARS Coronavirus 2 Ag: NEGATIVE

## 2023-07-15 MED ORDER — AMOXICILLIN 875 MG PO TABS: 875.0000 mg | ORAL_TABLET | Freq: Two times a day (BID) | ORAL | 0 refills | Status: DC

## 2023-07-15 NOTE — Progress Notes (Signed)
Acute Office Visit  Subjective:     Patient ID: Connie Carlson, female    DOB: [birth year], 42 y.o.   MRN: [id number]  Chief Complaint  Patient presents with   Cough    C/o cough, sore throat,  bodyaches, and headache  x 3 days. Unknown if has had fever. Using dayquil and nyquil OTC .     Cough   Connie Carlson is a 42 year old female who presents to the clinic with URI symptoms. She states this started three days ago. She has a non productive cough, nasal congestion, sore throat, and has felt feverish but has not been checking her temperature at home. She has tried Dayquil and Nyquil with little relief. She doesn't know of any sick contacts but does work at [employer] and interacts with a lot of people most days. Denies SOB/CP.  Marland Kitchen. Active Ambulatory Problems    Diagnosis Date Noted   Allergic rhinitis 06/28/2006   GASTROESOPHAGEAL REFLUX, NO ESOPHAGITIS 06/28/2006   WEIGHT GAIN 01/01/2009   Migraine headache without aura 02/16/2013   IFG (impaired fasting glucose) 10/08/2013   Hypertension 06/11/2020   Severe obesity (BMI 35.0-39.9) with comorbidity (HCC) 06/11/2020   Depression, recurrent (HCC) 04/07/2021   Anal fissure 01/13/2022   Stress and adjustment reaction 06/29/2022   Major depressive disorder, recurrent episode, mild (HCC) 08/26/2022   Other insomnia 03/30/2023   Generalized obesity 03/30/2023   BMI 37.0-37.9, adult 03/30/2023   Elevated glucose 03/30/2023   Vitamin D deficiency 03/30/2023   Health care maintenance 03/30/2023   SOB (shortness of breath) on exertion 03/30/2023   Other fatigue 03/30/2023   Resolved Ambulatory Problems    Diagnosis Date Noted   Morbid obesity (HCC) 06/28/2006   Migraine headache 11/15/2007   Acute pharyngitis 03/05/2009   INFLUENZA 09/22/2009   Acute gastritis 06/28/2006   Alopecia 12/03/2009   Dysuria 11/15/2007   AMENORRHEA 11/18/2010   PARESTHESIA 11/18/2010   Prediabetes 05/06/2015   Breakthrough bleeding on birth control pills  10/17/2020   Sinobronchitis 10/23/2021   Menorrhagia 04/06/2022   Past Medical History:  Diagnosis Date   High blood pressure      Review of Systems  Respiratory:  Positive for cough.    See HPI     Objective:    BP (!) 141/68   Pulse 68   Temp 98.7 F (37.1 C)   Ht 5\' 1"  (1.549 m)   Wt 216 lb 8 oz (98.2 kg)   LMP 06/05/2023 (Exact Date)   SpO2 98%   BMI 40.91 kg/m  BP Readings from Last 3 Encounters:  07/15/23 (!) 141/68  06/28/23 131/74  05/31/23 136/84   Wt Readings from Last 3 Encounters:  07/15/23 216 lb 8 oz (98.2 kg)  06/28/23 217 lb (98.4 kg)  05/31/23 210 lb (95.3 kg)    .Marland Kitchen Results for orders placed or performed in visit on 07/15/23  POC COVID-19  Result Value Ref Range   SARS Coronavirus 2 Ag Negative Negative  POCT Influenza A/B  Result Value Ref Range   Influenza A, POC Negative Negative   Influenza B, POC Negative Negative  POCT rapid strep A  Result Value Ref Range   Rapid Strep A Screen Positive (A) Negative      Physical Exam Constitutional:      Appearance: She is ill-appearing.  HENT:     Head: Normocephalic and atraumatic.     Right Ear: Tympanic membrane, ear canal and external ear normal.  Left Ear: Tympanic membrane, ear canal and external ear normal.     Nose: Rhinorrhea present.     Mouth/Throat:     Pharynx: Posterior oropharyngeal erythema present. No oropharyngeal exudate.  Eyes:     Conjunctiva/sclera: Conjunctivae normal.  Cardiovascular:     Rate and Rhythm: Normal rate and regular rhythm.     Heart sounds: Normal heart sounds.  Pulmonary:     Effort: Pulmonary effort is normal.     Breath sounds: Normal breath sounds.  Neurological:     General: No focal deficit present.     Mental Status: She is alert and oriented to person, place, and time.  Psychiatric:        Mood and Affect: Mood normal.        Behavior: Behavior normal.        Assessment & Plan:  .Briona was seen today for cough.  Diagnoses and  all orders for this visit:  Strep pharyngitis -     amoxicillin (AMOXIL) 875 MG tablet; Take 1 tablet (875 mg total) by mouth 2 (two) times daily.  Cough, unspecified type -     POC COVID-19 -     POCT Influenza A/B  Pharyngitis, unspecified etiology -     POC COVID-19 -     POCT Influenza A/B -     POCT rapid strep A     Strep PCR positive  COVID/flu negative Start Amoxicillin BID. Patient educated to take full course of antibiotics. Gargle salt water and use tylenol or ibuprofen for symptomatic relief.  Follow up if symptoms persist or get worse.    Tandy Gaw, PA-C

## 2023-07-15 NOTE — Patient Instructions (Signed)

## 2023-07-19 ENCOUNTER — Ambulatory Visit: Payer: 59 | Admitting: Nurse Practitioner

## 2023-07-22 ENCOUNTER — Encounter: Payer: Self-pay | Admitting: Professional

## 2023-07-22 ENCOUNTER — Ambulatory Visit: Payer: 59 | Admitting: Professional

## 2023-07-22 DIAGNOSIS — F4322 Adjustment disorder with anxiety: Secondary | ICD-10-CM | POA: Diagnosis not present

## 2023-07-22 DIAGNOSIS — F331 Major depressive disorder, recurrent, moderate: Secondary | ICD-10-CM | POA: Diagnosis not present
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
## 2023-07-25 ENCOUNTER — Other Ambulatory Visit: Payer: Self-pay | Admitting: Family Medicine

## 2023-07-25 DIAGNOSIS — I10 Essential (primary) hypertension: Secondary | ICD-10-CM

## 2023-07-26 ENCOUNTER — Ambulatory Visit: Payer: 59 | Admitting: Family Medicine

## 2023-08-02 ENCOUNTER — Ambulatory Visit: Payer: 59 | Admitting: Nurse Practitioner

## 2023-08-03 ENCOUNTER — Encounter: Payer: Self-pay | Admitting: Family Medicine

## 2023-08-03 MED ORDER — DULOXETINE HCL 20 MG PO CPEP: 20.0000 mg | ORAL_CAPSULE | Freq: Every day | ORAL | 3 refills | Status: DC

## 2023-08-04 ENCOUNTER — Telehealth: Payer: Self-pay | Admitting: *Deleted

## 2023-08-04 NOTE — Telephone Encounter (Signed)
Left patient a message to call and schedule 3 month F/U with Dr. Berton Lan.

## 2023-08-05 ENCOUNTER — Ambulatory Visit (INDEPENDENT_AMBULATORY_CARE_PROVIDER_SITE_OTHER): Payer: 59 | Admitting: Professional

## 2023-08-05 ENCOUNTER — Encounter: Payer: Self-pay | Admitting: Professional

## 2023-08-05 DIAGNOSIS — F331 Major depressive disorder, recurrent, moderate: Secondary | ICD-10-CM | POA: Diagnosis not present

## 2023-08-05 DIAGNOSIS — F4322 Adjustment disorder with anxiety: Secondary | ICD-10-CM | POA: Diagnosis not present
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
## 2023-08-19 ENCOUNTER — Ambulatory Visit: Payer: 59 | Admitting: Professional

## 2023-08-23 ENCOUNTER — Ambulatory Visit: Payer: 59 | Admitting: Nurse Practitioner

## 2023-08-29 NOTE — Therapy (Unsigned)
OUTPATIENT PHYSICAL THERAPY FEMALE PELVIC EVALUATION   Patient Name: Connie Carlson MRN: [id number] DOB:1981/01/29, 42 y.o., female Today's Date: 08/29/2023  END OF SESSION:   Past Medical History:  Diagnosis Date   High blood pressure    Past Surgical History:  Procedure Laterality Date   CHOLECYSTECTOMY     mole remove     under right breast by [facility] - skin cancer   Patient Active Problem List   Diagnosis Date Noted   Other insomnia 03/30/2023   Generalized obesity 03/30/2023   BMI 37.0-37.9, adult 03/30/2023   Elevated glucose 03/30/2023   Vitamin D deficiency 03/30/2023   Health care maintenance 03/30/2023   SOB (shortness of breath) on exertion 03/30/2023   Other fatigue 03/30/2023   Major depressive disorder, recurrent episode, mild (HCC) 08/26/2022   Stress and adjustment reaction 06/29/2022   Anal fissure 01/13/2022   Depression, recurrent (HCC) 04/07/2021   Hypertension 06/11/2020   Severe obesity (BMI 35.0-39.9) with comorbidity (HCC) 06/11/2020   IFG (impaired fasting glucose) 10/08/2013   Migraine headache without aura 02/16/2013   WEIGHT GAIN 01/01/2009   Allergic rhinitis 06/28/2006   GASTROESOPHAGEAL REFLUX, NO ESOPHAGITIS 06/28/2006    PCP: Agapito Games, MD  REFERRING PROVIDER: Lennart Pall, MD   REFERRING DIAG: [phone] (ICD-10-CM) - Pelvic floor dysfunction   THERAPY DIAG:  No diagnosis found.  Rationale for Evaluation and Treatment: Rehabilitation  ONSET DATE: ***  SUBJECTIVE:                                                                                                                                                                                           SUBJECTIVE STATEMENT: *** Fluid intake: {Yes/No:304960894}   PAIN:  Are you having pain? {yes/no:20286} NPRS scale: ***/10 Pain location: {pelvic pain location:27098}  Pain type: {type:313116} Pain description: {PAIN DESCRIPTION:21022940}    Aggravating factors: *** Relieving factors: ***  PRECAUTIONS: {Therapy precautions:24002}  RED FLAGS: {PT Red Flags:29287}   WEIGHT BEARING RESTRICTIONS: No  FALLS:  Has patient fallen in last 6 months? {fallsyesno:27318}  LIVING ENVIRONMENT: Lives with: {OPRC lives with:25569::"lives with their family"} Lives in: {Lives in:25570} Stairs: {opstairs:27293} Has following equipment at home: {Assistive devices:23999}  OCCUPATION: ***  PLOF: {PLOF:24004}  PATIENT GOALS: ***  PERTINENT HISTORY:  Cholecystectomy Sexual abuse: {Yes/No:304960894}  BOWEL MOVEMENT: Pain with bowel movement: {yes/no:20286} Type of bowel movement:{PT BM type:27100} Fully empty rectum: {Yes/No:304960894} Leakage: {Yes/No:304960894} Pads: {Yes/No:304960894} Fiber supplement: {Yes/No:304960894}  URINATION: Pain with urination: {yes/no:20286} Fully empty bladder: {Yes/No:304960894} Stream: {PT urination:27102} Urgency: {Yes/No:304960894} Frequency: *** Leakage: {PT leakage:27103} Pads: {Yes/No:304960894}  INTERCOURSE: Pain with intercourse: {pain with intercourse PA:27099} Ability  to have vaginal penetration:  {Yes/No:304960894} Climax: *** Marinoff Scale: ***/3  PREGNANCY: Vaginal deliveries *** Tearing {Yes***/No:304960894} C-section deliveries *** Currently pregnant {Yes***/No:304960894}  PROLAPSE: {PT prolapse:27101}   OBJECTIVE:  Note: Objective measures were completed at Evaluation unless otherwise noted.  DIAGNOSTIC FINDINGS:  ***  PATIENT SURVEYS:  {rehab surveys:24030}  PFIQ-7 ***  COGNITION: Overall cognitive status: {cognition:24006}     SENSATION: Light touch: {intact/deficits:24005} Proprioception: {intact/deficits:24005}  MUSCLE LENGTH: Hamstrings: Right *** deg; Left *** deg Thomas test: Right *** deg; Left *** deg  LUMBAR SPECIAL TESTS:  {lumbar special test:25242}  FUNCTIONAL TESTS:  {Functional tests:24029}  GAIT: Distance walked:  *** Assistive device utilized: {Assistive devices:23999} Level of assistance: {Levels of assistance:24026} Comments: ***  POSTURE: {posture:25561}  PELVIC ALIGNMENT:  LUMBARAROM/PROM:  A/PROM A/PROM  eval  Flexion   Extension   Right lateral flexion   Left lateral flexion   Right rotation   Left rotation    (Blank rows = not tested)  LOWER EXTREMITY ROM:  {AROM/PROM:27142} ROM Right eval Left eval  Hip flexion    Hip extension    Hip abduction    Hip adduction    Hip internal rotation    Hip external rotation    Knee flexion    Knee extension    Ankle dorsiflexion    Ankle plantarflexion    Ankle inversion    Ankle eversion     (Blank rows = not tested)  LOWER EXTREMITY MMT:  MMT Right eval Left eval  Hip flexion    Hip extension    Hip abduction    Hip adduction    Hip internal rotation    Hip external rotation    Knee flexion    Knee extension    Ankle dorsiflexion    Ankle plantarflexion    Ankle inversion    Ankle eversion     PALPATION:   General  ***                External Perineal Exam ***                             Internal Pelvic Floor ***  Patient confirms identification and approves PT to assess internal pelvic floor and treatment {yes/no:20286}  PELVIC MMT:   MMT eval  Vaginal   Internal Anal Sphincter   External Anal Sphincter   Puborectalis   Diastasis Recti   (Blank rows = not tested)        TONE: ***  PROLAPSE: ***  TODAY'S TREATMENT:                                                                                                                              DATE: ***  EVAL ***   PATIENT EDUCATION:  Education details: *** Person educated: {Person educated:25204} Education method: {Education Method:25205} Education comprehension: {Education Comprehension:25206}  HOME EXERCISE PROGRAM: ***  ASSESSMENT:  CLINICAL  IMPRESSION: Patient is a *** y.o. *** who was seen today for physical therapy evaluation  and treatment for ***.   OBJECTIVE IMPAIRMENTS: {opptimpairments:25111}.   ACTIVITY LIMITATIONS: {activitylimitations:27494}  PARTICIPATION LIMITATIONS: {participationrestrictions:25113}  PERSONAL FACTORS: {Personal factors:25162} are also affecting patient's functional outcome.   REHAB POTENTIAL: {rehabpotential:25112}  CLINICAL DECISION MAKING: {clinical decision making:25114}  EVALUATION COMPLEXITY: {Evaluation complexity:25115}   GOALS: Goals reviewed with patient? {yes/no:20286}  SHORT TERM GOALS: Target date: ***  *** Baseline: Goal status: INITIAL  2.  *** Baseline:  Goal status: INITIAL  3.  *** Baseline:  Goal status: INITIAL  4.  *** Baseline:  Goal status: INITIAL  5.  *** Baseline:  Goal status: INITIAL  6.  *** Baseline:  Goal status: INITIAL  LONG TERM GOALS: Target date: ***  *** Baseline:  Goal status: INITIAL  2.  *** Baseline:  Goal status: INITIAL  3.  *** Baseline:  Goal status: INITIAL  4.  *** Baseline:  Goal status: INITIAL  5.  *** Baseline:  Goal status: INITIAL  6.  *** Baseline:  Goal status: INITIAL  PLAN:  PT FREQUENCY: {rehab frequency:25116}  PT DURATION: {rehab duration:25117}  PLANNED INTERVENTIONS: {rehab planned interventions:25118::"97110-Therapeutic exercises","97530- Therapeutic [phone]- Neuromuscular re-education","97535- Self JXBJ","47829- Manual therapy"}  PLAN FOR NEXT SESSION: ***   Demba Nigh, PT 08/29/2023, 8:07 AM

## 2023-08-30 ENCOUNTER — Other Ambulatory Visit: Payer: Self-pay

## 2023-08-30 ENCOUNTER — Encounter: Payer: Self-pay | Admitting: Physical Therapy

## 2023-08-30 ENCOUNTER — Encounter: Payer: Self-pay | Admitting: Family Medicine

## 2023-08-30 ENCOUNTER — Ambulatory Visit: Payer: 59 | Admitting: Family Medicine

## 2023-08-30 ENCOUNTER — Encounter: Payer: 59 | Attending: Obstetrics and Gynecology | Admitting: Physical Therapy

## 2023-08-30 VITALS — BP 130/78 | HR 77 | Ht 61.0 in | Wt 223.0 lb

## 2023-08-30 DIAGNOSIS — M6281 Muscle weakness (generalized): Secondary | ICD-10-CM | POA: Diagnosis present

## 2023-08-30 DIAGNOSIS — R102 Pelvic and perineal pain: Secondary | ICD-10-CM | POA: Insufficient documentation

## 2023-08-30 DIAGNOSIS — F330 Major depressive disorder, recurrent, mild: Secondary | ICD-10-CM | POA: Diagnosis not present

## 2023-08-30 DIAGNOSIS — M6289 Other specified disorders of muscle: Secondary | ICD-10-CM | POA: Insufficient documentation

## 2023-08-30 DIAGNOSIS — R252 Cramp and spasm: Secondary | ICD-10-CM | POA: Diagnosis present

## 2023-08-30 DIAGNOSIS — R1030 Lower abdominal pain, unspecified: Secondary | ICD-10-CM | POA: Diagnosis present

## 2023-08-30 DIAGNOSIS — I10 Essential (primary) hypertension: Secondary | ICD-10-CM

## 2023-08-30 MED ORDER — DULOXETINE HCL 30 MG PO CPEP: 30.0000 mg | ORAL_CAPSULE | Freq: Every day | ORAL | 0 refills | Status: DC

## 2023-08-30 NOTE — Patient Instructions (Signed)
Send me a MyChart note in about 3 weeks before you refill the 30 mg tab and let me know if you feel like we need to go up on the medication.  Otherwise I will see you back in 7 weeks to follow-up.  Will do blood work at that time as well.

## 2023-08-30 NOTE — Assessment & Plan Note (Signed)
Discussed options.  Will increase duloxetine to 30 mg.  At the end of the 30 days if she would like to go up on the medication to either 40 or 60 mg we can.  We discussed the importance of Connie Carlson is getting that does correct to really help alleviate her symptoms.  Her PHQ-9 score was 9 today and GAD-7 score of 8 she has noticed some positive improvements.  We did discuss whether or not the sex drive issue was a deal breaker she says for now she will just continue with the medication and we will circle back to that later.

## 2023-08-30 NOTE — Assessment & Plan Note (Signed)
Pressure does look better today it was elevated last time she was here.

## 2023-08-30 NOTE — Progress Notes (Signed)
Established Patient Office Visit  Subjective   Patient ID: Connie Carlson, female    DOB: 1981/01/27  Age: 42 y.o. MRN: [id number]  Chief Complaint  Patient presents with   mood    HPI Here for depression follow-up.  I recently started her on an SSRI, Lexapro, but she was not having a good response.  She had sent me a MyChart note and we decided to taper off and switch to duloxetine.  It looks like she did engage in a few therapy sessions.  She says so far the Cymbalta has been definitely working better than the Lexapro.  She has noticed that her mind is racing a lot less at night so she is actually sleeping a little better.  She has been less tearful pill late.  She feels like she has to have less of the internal conversation to get up and get to work in the mornings.  She has noticed though that she has had a negative impact on her sex drive but so far that feeling negative side effect.  OB/GYN referred her to pelvic PT and she is Weight lifting restrictions of no more than 25 pounds.  She is actually gena start pelvic PT today.  It will be her first session.      ROS    Objective:     BP 130/78   Pulse 77   Ht 5\' 1"  (1.549 m)   Wt 223 lb (101.2 kg)   SpO2 100%   BMI 42.14 kg/m    Physical Exam Vitals and nursing note reviewed.  Constitutional:      Appearance: Normal appearance.  HENT:     Head: Normocephalic and atraumatic.  Eyes:     Conjunctiva/sclera: Conjunctivae normal.  Cardiovascular:     Rate and Rhythm: Normal rate and regular rhythm.  Pulmonary:     Effort: Pulmonary effort is normal.     Breath sounds: Normal breath sounds.  Skin:    General: Skin is warm and dry.  Neurological:     Mental Status: She is alert.  Psychiatric:        Mood and Affect: Mood normal.      No results found for any visits on 08/30/23.    The 10-year ASCVD risk score (Arnett DK, et al., 2019) is: 0.6%    Assessment & Plan:   Problem List Items Addressed This  Visit       Cardiovascular and Mediastinum   Hypertension    Pressure does look better today it was elevated last time she was here.        Other   Major depressive disorder, recurrent episode, mild (HCC) - Primary    Discussed options.  Will increase duloxetine to 30 mg.  At the end of the 30 days if she would like to go up on the medication to either 40 or 60 mg we can.  We discussed the importance of Lesly Rubenstein is getting that does correct to really help alleviate her symptoms.  Her PHQ-9 score was 9 today and GAD-7 score of 8 she has noticed some positive improvements.  We did discuss whether or not the sex drive issue was a deal breaker she says for now she will just continue with the medication and we will circle back to that later.      Relevant Medications   DULoxetine (CYMBALTA) 30 MG capsule    Return in about 7 weeks (around 10/18/2023) for Mood, Hypertension, and Labs .  Nani Gasser, MD

## 2023-09-02 ENCOUNTER — Ambulatory Visit: Payer: 59 | Admitting: Professional

## 2023-09-02 ENCOUNTER — Telehealth: Payer: Self-pay | Admitting: Family Medicine

## 2023-09-02 ENCOUNTER — Encounter: Payer: Self-pay | Admitting: Professional

## 2023-09-02 DIAGNOSIS — F331 Major depressive disorder, recurrent, moderate: Secondary | ICD-10-CM

## 2023-09-02 DIAGNOSIS — F4322 Adjustment disorder with anxiety: Secondary | ICD-10-CM | POA: Diagnosis not present

## 2023-09-02 NOTE — Progress Notes (Signed)
[facility] Counselor/Therapist Progress Note  Patient ID: Connie Carlson, MRN: [id number],    Prefers to go by Connie Carlson, her middle name  Date: 09/02/2023  Time Spent:  50 minutes 805-855am   Treatment Type: Individual Therapy  Risk Assessment: Danger to Self:  No Self-injurious Behavior: No Danger to Others: No  Subjective: The patient arrived late for her inperson appointment.   Issues addressed: 1-depressed mood -she says she is feeling "about 10% better" than our previous session -current symptoms including: low energy, not returning to regular activities, increased difficulty concentrating at work, increased irritability -positives: pt not having night awakening with worry; she is able to go back to sleep without worrypt reports she has noticed an increase in depressed mood with increased isolation, unable to get out of bed, lack of concentration, and poor sleep -Cymbalta was increased this week from 20mg  to 30mg  and she believes it is a good fit -based on hx of PHQ-9 she is showing improvement     08/30/2023    9:14 AM 05/27/2023    8:17 AM 03/14/2023    3:43 PM  Depression screen PHQ 2/9  Decreased Interest 2 2 2   Down, Depressed, Hopeless 1 2 2   PHQ - 2 Score 3 4 4   Altered sleeping 0 3 1  Tired, decreased energy 1 3 1   Change in appetite 2 3 1   Feeling bad or failure about yourself  1 3 2   Trouble concentrating 1 0 1  Moving slowly or fidgety/restless 1 0 0  Suicidal thoughts 0 0 0  PHQ-9 Score 9 16 10   Difficult doing work/chores Somewhat difficult Not difficult at all Somewhat difficult  2-relationship with [name] -she no longer has any communication with ex bf and has not since his August birthday -pt has bee out of the relationship since July 2023 -she wishes things could be different but admits that he does things his way -she no longer reads texts or emails and doesn't pick up the phone   -thou painful she admits that it is best for her -she  admits any contact with him causes her a setback 2-family relationships -mom living with her and is now enjoying life like never before   -she hs friends, goes out and listens to music -pt works on weekends and her mom spends a lot of time wit her sister -pt has always felt jealous of her sister since she was young -her parents showed favoritism -pt's sister became legal in this country before her and has more -pt was off this past weekend and Reuel Boom was off -father moved to [place] at end of July   -we have our father close but we don't because of "this woman" -pt talks with father via text -went to [place] with son Reuel Boom and invited mom; her mom wanted to tell her sister   -pt noticed her mom was not happy about not including her sister   -picked up the things from back porch her father bought for her from [place]  Treatment Plan Problems Addressed  Career Success Obstacles, Depression, Low Self-Esteem/Lack of Assertiveness, Partner Relational Problems  Goals 1. Develop a sense of own personal power and self-esteem within the relationship. 2. Develop advocacy and empowerment skills that involve taking a proactive stance in the work environment. 3. Develop assertiveness skills and techniques for application in work-related activities (e.g., establishing boundaries with coworkers, Garment/textile technologist). 4. Develop healthy cognitive mechanisms to facilitate positive attitudes and beliefs about self within the context  of one's environment to mitigate depressive symptoms. Objective Articulate signs and symptoms of depression in current life experiences. Target Date: 2023-10-07 Frequency: Biweekly  Progress: 70 Modality: individual  Related Interventions Encourage the client to share her feelings of depression to gain an insight into precipitating events and implications of symptoms; normalize her feelings of depression. Objective Implement behavioral interventions to overcome depression. Target  Date: 2023-10-07 Frequency: Biweekly  Progress: 30 Modality: individual  Related Interventions Assign the client to participate as fully as possible in a healthy exercise regimen. Encourage the client to continue participation in positive social support systems. Objective Identify and replace cognitive self-talk that supports depression. Target Date: 2023-10-07 Frequency: Biweekly  Progress: 30 Modality: individual  Related Interventions Reinforce the client's positive, reality-based cognitive messages that enhance self-confidence and increase adaptive action (see "Positive Self-Talk" in the Adult Psychotherapy Homework Planner, 2nd ed. by Stephannie Li). Do "behavioral experiments" in which depressive automatic thoughts are treated as hypotheses/predictions, reality-based alternative hypotheses/ predictions are generated, and both are tested against the client's past, present, and/or future experiences. Encourage the client to discuss cognitive distortions, including automatic thoughts (e.g., negative view of self, future, experience) and negative schemas (e.g., core beliefs about self and others based on earlier childhood experiences); assess frequency of negative self-statements associated with depression. Objective Increase the frequency of engaging in pleasant activities. Target Date: 2023-10-07 Frequency: Biweekly  Progress: 30 Modality: individual  Related Interventions Assign the client reading materials regarding overcoming depression for women (e.g., Women and Depression: A Practical Self-Help Guide by Allyne Gee; Feeling Good by Lawerance Bach; Women & Depression by Collier Bullock; Silencing the Self: Women and Depression by Ree Kida). Objective Identify precipitating events and factors of depression, particularly roles of self and others in depression. Target Date: 2023-10-07 Frequency: Biweekly  Progress: 80 Modality: individual  Related Interventions Explore the degree to which primary symptoms of  depression are a result of gender role socialization (e.g., passivity, decreased self-esteem, learned helplessness, interpersonal orientation towards pleasing others). Evaluate symptoms to consider the presence of culture-bound syndromes and/or culture specificity of symptoms (e.g., ataques de nervios, amok, postemigration effects). Objective Increase the level of physical exercise. Target Date: 2023-10-07 Frequency: Biweekly  Progress: 10 Modality: individual  Objective Increase social contacts and communicate needs within existing interpersonal relationships. Target Date: 2023-10-07 Frequency: Biweekly  Progress: 30 Modality: individual  5. Explore career options that have been automatically ruled out due to low self-concept or limited opportunities. Objective Identify and replace at least three negative self-talk statements that perpetuate feelings of powerlessness or hopelessness. Target Date: 2023-10-07 Frequency: Biweekly  Progress: 10 Modality: individual  Objective Identify and replace cognitive self-talk that contributes to all-or-nothing thinking, gender-role stereotypes, or low career self-efficacy. Target Date: 2023-10-07 Frequency: Biweekly  Progress: 10 Modality: individual  Objective Develop career-related strategies to overcome and persist in the face of obstacles. Target Date: 2023-10-07 Frequency: Biweekly  Progress: 30 Modality: individual  Objective Increase self-esteem and self-concept by exploring and encompassing other roles/identities besides those of career. Target Date: 2023-10-07 Frequency: Biweekly  Progress: 20 Modality: individual  Objective Identify barriers and personal benefits to balancing career and family life. Target Date: 2023-10-07 Frequency: Biweekly  Progress: 60 Modality: individual  6. Identify and increase intrapersonal, interpersonal, and physical resources to foster positive coping strategies. 7. Improve depressed mood to maximize  effective social, occupational, and physical functioning. 8. Improve self-esteem and develop a positive self-image as capable and competent. 9. Increase ability to express needs and desires openly and honestly. 10. Increase assertiveness skills and ability  to advocate for self. 11. Increase awareness of own role in relationship conflicts. Objective Identify a pattern of repeatedly forming destructive intimate relationships. Target Date: 2023-10-07 Frequency: Biweekly  Progress: 60 Modality: individual  12. Increase involvement in activities that foster confidence and a sense of accomplishment. Objective Acknowledge self-disparaging statements and recognize the tendency to engage in such statements. Target Date: 2023-10-07 Frequency: Biweekly  Progress: 60 Modality: individual  Related Interventions Ask the client to complete and process an exercise in the book Ten Days to Self-Esteem! Lawerance Bach). Objective Verbalize an understanding of the differences between passive, assertive, and aggressive behavior. Target Date: 2023-10-07 Frequency: Biweekly  Progress: 50 Modality: individual  Objective Describe personal history of self-devaluation and lack of assertiveness. Target Date: 2023-10-07 Frequency: Biweekly  Progress: 60 Modality: individual  Related Interventions Use cognitive-restructuring techniques to change the client's belief system (i.e., via reality-testing and rational thought) toward a more positive, realistic self-perception. Assist the client in becoming aware of how she indirectly expresses negative feelings about self (e.g., lack of eye contact, social withdrawal, sweating, expectations of failure or rejection). Assist the client in identifying negative beliefs about herself. Discuss context of self-disparagement, including client's perceptions of self from childhood to present, role of caregivers and peers, and related experiences. Objective Identify three roadblocks to  improved self-esteem. Target Date: 2023-10-07 Frequency: Biweekly  Progress: 0 Modality: individual  Objective   Diagnosis:Major depressive disorder, recurrent episode, moderate (HCC)  Adjustment disorder with anxiety  Plan: -pt wants to talk at next session about her relationship with her father -meet again on Friday, September 30, 2023 at 8am in person

## 2023-09-02 NOTE — Telephone Encounter (Signed)
Patient dropped off document FMLA, to be filled out by provider. Patient requested to send it back via Fax within 7-days. Document is located in providers tray at front office.Please advise at [facility] [phone]

## 2023-09-06 ENCOUNTER — Ambulatory Visit: Payer: 59 | Admitting: Nurse Practitioner

## 2023-09-06 ENCOUNTER — Encounter: Payer: 59 | Admitting: Physical Therapy

## 2023-09-07 ENCOUNTER — Ambulatory Visit: Payer: 59 | Admitting: Obstetrics and Gynecology

## 2023-09-08 NOTE — Telephone Encounter (Signed)
Please call patient I started to fill out her paperwork and just wanted to clarify that this is an extension it looks like the original forms were approved from June 1 to November 28.  And so if she wanting [place] to extend the FMLA?

## 2023-09-09 NOTE — Telephone Encounter (Signed)
Connie Carlson states she needed extra time each month for past time off. She has been taking an extra day each month. More than the 4 days already approved.

## 2023-09-12 NOTE — Telephone Encounter (Signed)
Arms completed and placed in [facility] basket

## 2023-09-13 ENCOUNTER — Encounter: Payer: 59 | Admitting: Physical Therapy

## 2023-09-13 ENCOUNTER — Ambulatory Visit: Payer: 59 | Admitting: Physical Therapy

## 2023-09-16 ENCOUNTER — Telehealth: Payer: Self-pay

## 2023-09-16 NOTE — Telephone Encounter (Signed)
Copied from CRM [id number]. Topic: General - Other >> Sep 16, 2023 12:06 PM Donita Brooks wrote: Reason for CRM: pt is calling in regarding fmla paperwork for migraine. pt drop off paperwork 2-3 weeks ago. pt wanted to know was the paperwork cone cause she has until December 31st to return it.

## 2023-09-18 NOTE — Telephone Encounter (Signed)
Please see other phone note. It was faxed 12/23 and placed in [facility] basket. Please notify pt

## 2023-09-19 NOTE — Telephone Encounter (Signed)
LVM advising pt of faxed docs. Pt aware pick-up available from [facility].

## 2023-09-26 NOTE — Telephone Encounter (Signed)
 Forms faxed ,confirmation received and scanned into pt's chart

## 2023-09-27 ENCOUNTER — Encounter: Payer: 59 | Attending: Obstetrics and Gynecology | Admitting: Physical Therapy

## 2023-09-27 ENCOUNTER — Encounter: Payer: Self-pay | Admitting: Physical Therapy

## 2023-09-27 ENCOUNTER — Other Ambulatory Visit: Payer: Self-pay | Admitting: Family Medicine

## 2023-09-27 DIAGNOSIS — R102 Pelvic and perineal pain: Secondary | ICD-10-CM | POA: Diagnosis present

## 2023-09-27 DIAGNOSIS — M6281 Muscle weakness (generalized): Secondary | ICD-10-CM | POA: Diagnosis present

## 2023-09-27 DIAGNOSIS — R1030 Lower abdominal pain, unspecified: Secondary | ICD-10-CM

## 2023-09-27 DIAGNOSIS — R252 Cramp and spasm: Secondary | ICD-10-CM | POA: Diagnosis present

## 2023-09-27 DIAGNOSIS — F330 Major depressive disorder, recurrent, mild: Secondary | ICD-10-CM

## 2023-09-27 NOTE — Therapy (Signed)
 OUTPATIENT PHYSICAL THERAPY FEMALE PELVIC TREATMENT   Patient Name: Connie Carlson MRN: [id number] DOB:1981-08-28, 43 y.o., female Today's Date: 09/27/2023  END OF SESSION:  PT End of Session - 09/27/23 1034     Visit Number 2    Date for PT Re-Evaluation 02/28/24    Authorization Type Aet[employer]    PT Start Time 1030    PT Stop Time 1115    PT Time Calculation (min) 45 min    Activity Tolerance Patient tolerated treatment well    Behavior During Therapy [facility] for tasks assessed/performed             Past Medical History:  Diagnosis Date   High blood pressure    Past Surgical History:  Procedure Laterality Date   CHOLECYSTECTOMY     mole remove     under right breast by [facility] - skin cancer   Patient Active Problem List   Diagnosis Date Noted   Other insomnia 03/30/2023   Generalized obesity 03/30/2023   BMI 37.0-37.9, adult 03/30/2023   Elevated glucose 03/30/2023   Vitamin D  deficiency 03/30/2023   Health care maintenance 03/30/2023   SOB (shortness of breath) on exertion 03/30/2023   Other fatigue 03/30/2023   Major depressive disorder, recurrent episode, mild (HCC) 08/26/2022   Stress and adjustment reaction 06/29/2022   Anal fissure 01/13/2022   Depression, recurrent (HCC) 04/07/2021   Hypertension 06/11/2020   Severe obesity (BMI 35.0-39.9) with comorbidity (HCC) 06/11/2020   IFG (impaired fasting glucose) 10/08/2013   Migraine headache without aura 02/16/2013   WEIGHT GAIN 01/01/2009   Allergic rhinitis 06/28/2006   GASTROESOPHAGEAL REFLUX, NO ESOPHAGITIS 06/28/2006    PCP: Alvan Dorothyann BIRCH, MD  REFERRING PROVIDER: Erik Kieth JAYSON, MD   REFERRING DIAG: [phone] (ICD-10-CM) - Pelvic floor dysfunction   THERAPY DIAG:  Cramp and spasm  Pelvic pain  Lower abdominal pain  Muscle weakness (generalized)  Rationale for Evaluation and Treatment: Rehabilitation  ONSET DATE: 1/23  SUBJECTIVE:                                                                                                                                                                                            SUBJECTIVE STATEMENT: No changes from the initial eval.  PAIN:  Are you having pain? Yes NPRS scale: 4-8/10 Pain location:  lower abdominal area  Pain type: pressure, like something is going to come out of the vagina Pain description: intermittent   Aggravating factors: lifting heavy items, standing several hours, carrying and bending over Relieving factors: Advil  PRECAUTIONS: None  RED FLAGS: None   WEIGHT BEARING RESTRICTIONS: No  FALLS:  Has patient fallen in last 6 months? No  LIVING ENVIRONMENT: Lives with: lives with their family  OCCUPATION: works at [employer] with a lot of lifting.   PLOF: Independent  PATIENT GOALS: reduce pain  PERTINENT HISTORY:  Cholecystectomy Sexual abuse: No  BOWEL MOVEMENT: no issues  URINATION: Pain with urination: No Fully empty bladder: Yes:   Stream: Strong Urgency: No Frequency: average Leakage: Coughing Pads: No  INTERCOURSE: Pain with intercourse: After Intercourse, 10/10 Ability to have vaginal penetration:  Yes:   Climax: not when having pain Marinoff Scale: 2/3  PREGNANCY: Vaginal deliveries 1 Tearing No  OBJECTIVE:  Note: Objective measures were completed at Evaluation unless otherwise noted.  DIAGNOSTIC FINDINGS:  Ultrasound negative; biopsy is negative   COGNITION: Overall cognitive status: Within functional limits for tasks assessed     SENSATION: Light touch: Appears intact Proprioception: Appears intact   POSTURE: No Significant postural limitations  PELVIC ALIGNMENT:  LUMBARAROM/PROM: full lumbar ROM   LOWER EXTREMITY ROM: full bilateral hip ROM   LOWER EXTREMITY MMT: Bilateral hip strength is 4/5.    PALPATION:   General  tenderness located in the lower abdomen, decreased movement of the lower rib cage, good mobility of the diaphragm,  tenderness in the buttocks                External Perineal Exam tenderness located in the bulbocavernosus, ischiocavernosus, perineal body                             Internal Pelvic Floor levator ani, obturator internist, side of the introitus, sides of the bladder  Patient confirms identification and approves PT to assess internal pelvic floor and treatment Yes  PELVIC MMT:   MMT eval  Vaginal 3/5 holding for 6 seconds  (Blank rows = not tested)        TONE: increased  PROLAPSE: none  TODAY'S TREATMENT:       09/27/23 Manual: Soft tissue mobilization: Circular massage to the abdomen to improve tissue mobility Educated patient on how to lift her tissue up off the bladder in supine to reduce pressure and pain Myofascial release: Fascial release along the mesenteric root, along the umbilicus, urachus ligament, pulling tissue up off the pubic bone in supine Fascial release around the lower abdomen in quadruped with patient going back and forth, mobilization of tissue off the bladder and by the uterus Tissue rolling along the lower rib cage Supine with release around the bladder and uterus with knees moving side to side Exercises: Stretches/mobility: Karolynn pose 5 times with therapist assisting for her to bring buttocks to the feet Cat cow with tactile cues to posterior tilt the pelvis Supine diaphragmatic breathing with tactile cues on the posterior rib cage to expand the tissue and hands on the lower rib cage to move the rib cage downward  DATE: 08/30/23  EVAL see below   PATIENT EDUCATION:  09/27/23 Education details: Access Code: [email or web address], information on dry needling Person educated: Patient Education method: Explanation, Demonstration, Tactile cues, Verbal cues, and Handouts Education comprehension: verbalized understanding, returned demonstration,  verbal cues required, tactile cues required, and needs further education  HOME EXERCISE PROGRAM: 09/27/23 Access Code: [email or web address] URL: [email or web address] Date: 09/27/2023 Prepared by: Channing Pereyra  Exercises - Seated Piriformis Stretch with Trunk Bend  - 1 x daily - 7 x weekly - 1 sets - 1 reps - 30 sec hold - Seated Hamstring Stretch  - 1 x daily - 7 x weekly - 1 sets - 1 reps - 30 sec hold - Seated Happy Baby With Trunk Flexion For Pelvic Relaxation  - 1 x daily - 7 x weekly - 1 sets - 1 reps - 30 sec hold - Cat Cow  - 1 x daily - 7 x weekly - 1 sets - 10 reps - Diaphragmatic Breathing in Child's Pose with Pelvic Floor Relaxation  - 1 x daily - 7 x weekly - 1 sets - 5 reps - 10 sec hold - Supine Diaphragmatic Breathing  - 1 x daily - 7 x weekly - 1 sets - 10 reps  Patient Education - Trigger Point Dry Needling  ASSESSMENT:  CLINICAL IMPRESSION: Patient is a 43 y.o. female who was seen today for physical therapy  treatment for pelvic floor dysfunction.  Patient reports her pain decreased to 3/10 after manual work and was not feeling pressure on the pelvic floor. She needs tactile cues to expand her lower rib cage with diaphragmatic breathing.  Patient had tenderness and restrictions in the lower abdomen. Patient will benefit from skilled therapy to reduce her pain to restore her function.    OBJECTIVE IMPAIRMENTS: decreased activity tolerance, decreased strength, increased fascial restrictions, increased muscle spasms, and pain.   ACTIVITY LIMITATIONS: lifting and standing  PARTICIPATION LIMITATIONS: cleaning, laundry, shopping, community activity, and occupation  PERSONAL FACTORS: Fitness are also affecting patient's functional outcome.   REHAB POTENTIAL: Excellent  CLINICAL DECISION MAKING: Stable/uncomplicated  EVALUATION COMPLEXITY: Low   GOALS: Goals reviewed with patient? Yes  SHORT TERM GOALS: Target date: 09/26/23  Patient independent with initial HEP  for flexibility and diaphragmatic  breathing.  Baseline: Goal status: INITIAL  2.  Patient educated on correct lifting technique to reduce strain on the pelvic floor.  Baseline:  Goal status: INITIAL  3.  Patient educated on perineal massage to reduce trigger points.  Baseline:  Goal status: INITIAL   LONG TERM GOALS: Target date: 02/1024  Patient independent with advanced HEP for core and pelvic floor strength.  Baseline:  Goal status: INITIAL  2.  Patient able to lift 20# with minimal to no pain due to reduction of trigger points in the pelvic floor.  Baseline:  Goal status: INITIAL  3.  Patient reports minimal to no pain after penile penetration due to the ability to lengthen the pelvic floor.  Baseline:  Goal status: INITIAL  4.  Patient reports her lower abdominal pain decreased </= 0-1/10 with standing 1-2 hours at work due to improve strength of the core and hips.  Baseline:  Goal status: INITIAL   PLAN:  PT FREQUENCY: 1x/week  PT DURATION: 6 months  PLANNED INTERVENTIONS: 97110-Therapeutic exercises, 97530- Therapeutic activity, [id number]- Neuromuscular re-education, 97140- Manual therapy, 97014- Electrical stimulation (unattended), 97035- Ultrasound, Patient/Family education, Taping, Dry Needling, Cryotherapy, Moist heat, and Biofeedback  PLAN FOR NEXT SESSION: manual work to the  abdomen,  pelvic floor work and educated patient on lifting correctly, hip flexor stretch, start to work on the outside of the pelvic floor   Channing Pereyra, PT 09/27/23 11:25 AM

## 2023-09-30 ENCOUNTER — Ambulatory Visit: Payer: 59 | Admitting: Professional

## 2023-10-04 ENCOUNTER — Encounter: Payer: 59 | Admitting: Physical Therapy

## 2023-10-04 ENCOUNTER — Encounter: Payer: Self-pay | Admitting: Physical Therapy

## 2023-10-04 DIAGNOSIS — R252 Cramp and spasm: Secondary | ICD-10-CM | POA: Diagnosis not present

## 2023-10-04 DIAGNOSIS — M6281 Muscle weakness (generalized): Secondary | ICD-10-CM

## 2023-10-04 DIAGNOSIS — R102 Pelvic and perineal pain: Secondary | ICD-10-CM

## 2023-10-04 DIAGNOSIS — R1030 Lower abdominal pain, unspecified: Secondary | ICD-10-CM

## 2023-10-04 NOTE — Therapy (Signed)
 OUTPATIENT PHYSICAL THERAPY FEMALE PELVIC TREATMENT   Patient Name: Connie Carlson MRN: [id number] DOB:04-09-1981, 43 y.o., female Today's Date: 10/04/2023  END OF SESSION:  PT End of Session - 10/04/23 1037     Visit Number 3    Date for PT Re-Evaluation 02/28/24    Authorization Type Aetna    PT Start Time 1030    PT Stop Time 1115    PT Time Calculation (min) 45 min    Activity Tolerance Patient tolerated treatment well    Behavior During Therapy [facility] for tasks assessed/performed             Past Medical History:  Diagnosis Date   High blood pressure    Past Surgical History:  Procedure Laterality Date   CHOLECYSTECTOMY     mole remove     under right breast by [facility]atology - skin cancer   Patient Active Problem List   Diagnosis Date Noted   Other insomnia 03/30/2023   Generalized obesity 03/30/2023   BMI 37.0-37.9, adult 03/30/2023   Elevated glucose 03/30/2023   Vitamin D  deficiency 03/30/2023   Health care maintenance 03/30/2023   SOB (shortness of breath) on exertion 03/30/2023   Other fatigue 03/30/2023   Major depressive disorder, recurrent episode, mild (HCC) 08/26/2022   Stress and adjustment reaction 06/29/2022   Anal fissure 01/13/2022   Depression, recurrent (HCC) 04/07/2021   Hypertension 06/11/2020   Severe obesity (BMI 35.0-39.9) with comorbidity (HCC) 06/11/2020   IFG (impaired fasting glucose) 10/08/2013   Migraine headache without aura 02/16/2013   WEIGHT GAIN 01/01/2009   Allergic rhinitis 06/28/2006   GASTROESOPHAGEAL REFLUX, NO ESOPHAGITIS 06/28/2006    PCP: Alvan Dorothyann BIRCH, MD  REFERRING PROVIDER: Erik Kieth JAYSON, MD   REFERRING DIAG: [phone] (ICD-10-CM) - Pelvic floor dysfunction   THERAPY DIAG:  Cramp and spasm  Pelvic pain  Lower abdominal pain  Muscle weakness (generalized)  Rationale for Evaluation and Treatment: Rehabilitation  ONSET DATE: 1/23  SUBJECTIVE:                                                                                                                                                                                            SUBJECTIVE STATEMENT: I have been doing the exercises. The pain is not as strong.  No changes from the initial eval.  PAIN:  Are you having pain? Yes NPRS scale: 6/10 Pain location:  lower abdominal area  Pain type: pressure, like something is going to come out of the vagina Pain description: intermittent   Aggravating factors: lifting heavy items, standing several hours, carrying and bending over Relieving factors: Advil  PRECAUTIONS: None  RED FLAGS: None   WEIGHT BEARING RESTRICTIONS: No  FALLS:  Has patient fallen in last 6 months? No  LIVING ENVIRONMENT: Lives with: lives with their family  OCCUPATION: works at [employer] with a lot of lifting.   PLOF: Independent  PATIENT GOALS: reduce pain  PERTINENT HISTORY:  Cholecystectomy Sexual abuse: No  BOWEL MOVEMENT: no issues  URINATION: Pain with urination: No Fully empty bladder: Yes:   Stream: Strong Urgency: No Frequency: average Leakage: Coughing Pads: No  INTERCOURSE: Pain with intercourse: After Intercourse, 10/10 Ability to have vaginal penetration:  Yes:   Climax: not when having pain Marinoff Scale: 2/3  PREGNANCY: Vaginal deliveries 1 Tearing No  OBJECTIVE:  Note: Objective measures were completed at Evaluation unless otherwise noted.  DIAGNOSTIC FINDINGS:  Ultrasound negative; biopsy is negative   COGNITION: Overall cognitive status: Within functional limits for tasks assessed     SENSATION: Light touch: Appears intact Proprioception: Appears intact   POSTURE: No Significant postural limitations  PELVIC ALIGNMENT:  LUMBARAROM/PROM: full lumbar ROM   LOWER EXTREMITY ROM: full bilateral hip ROM   LOWER EXTREMITY MMT: Bilateral hip strength is 4/5.    PALPATION:   General  tenderness located in the lower abdomen, decreased  movement of the lower rib cage, good mobility of the diaphragm, tenderness in the buttocks                External Perineal Exam tenderness located in the bulbocavernosus, ischiocavernosus, perineal body                             Internal Pelvic Floor levator ani, obturator internist, side of the introitus, sides of the bladder  Patient confirms identification and approves PT to assess internal pelvic floor and treatment Yes  PELVIC MMT:   MMT eval  Vaginal 3/5 holding for 6 seconds  (Blank rows = not tested)        TONE: increased  PROLAPSE: none  TODAY'S TREATMENT:      10/04/23 Manual: Soft tissue mobilization: Annual work to the diaphragm, along the upper abdominals, latissimus, lower lateral intercoastals Myofascial release: ascial release along the mesenteric root, along the umbilicus, urachus ligament, Spinal mobilization: Laying on side with therapist mobilizing the lower rib cage to improve opening Neuromuscular re-education: Down training: Diaphragmatic breathing to open up the pelvic floor in supine Exercises: Stretches/mobility: Supine figure 4 stretch holding 30 sec bil.  Supine hip flexor stretch holding 30 sec bil. Using a strap Sidely open book 10 x each side with tactile cues to increase stretch and then cues to contract abdominals when going back to start position     09/27/23 Manual: Soft tissue mobilization: Circular massage to the abdomen to improve tissue mobility Educated patient on how to lift her tissue up off the bladder in supine to reduce pressure and pain Myofascial release: Fascial release along the mesenteric root, along the umbilicus, urachus ligament, pulling tissue up off the pubic bone in supine Fascial release around the lower abdomen in quadruped with patient going back and forth, mobilization of tissue off the bladder and by the uterus Tissue rolling along the lower rib cage Supine with release around the bladder and uterus with  knees moving side to side Exercises: Stretches/mobility: Karolynn pose 5 times with therapist assisting for her to bring buttocks to the feet Cat cow with tactile cues to posterior tilt the pelvis Supine diaphragmatic breathing with tactile cues on the  posterior rib cage to expand the tissue and hands on the lower rib cage to move the rib cage downward    PATIENT EDUCATION:  10/04/23 Education details: Access Code: [email or web address], information on dry needling Person educated: Patient Education method: Explanation, Demonstration, Tactile cues, Verbal cues, and Handouts Education comprehension: verbalized understanding, returned demonstration, verbal cues required, tactile cues required, and needs further education  HOME EXERCISE PROGRAM: 10/04/23 Access Code: [email or web address] URL: [email or web address] Date: 10/04/2023 Prepared by: Channing Pereyra  Exercises -- Modified Thomas Stretch  - 1 x daily - 7 x weekly - 1 sets - 1 reps - 30 sec hold - Supine Diaphragmatic Breathing  - 1 x daily - 2 x weekly - 1 sets - 10 reps - Sidelying Thoracic Rotation with Open Book  - 1 x daily - 2 x weekly - 1 sets - 10 reps  Patient Education - Trigger Point Dry Needling  Patient Education - Trigger Point Dry Needling  ASSESSMENT:  CLINICAL IMPRESSION: Patient is a 43 y.o. female who was seen today for physical therapy  treatment for pelvic floor dysfunction.  Patient reports her average pain is 6/10 instead of 8/10.  Patient has a tight lower rib cage with tenderness. She is learning how to expand the lower rib cage for diaphragmatic breathing to relax the pelvic floor. She continues to have tightness in the lower abdomen and tenderness. Patient had tenderness and restrictions in the lower abdomen. Patient will benefit from skilled therapy to reduce her pain to restore her function.    OBJECTIVE IMPAIRMENTS: decreased activity tolerance, decreased strength, increased fascial restrictions, increased muscle  spasms, and pain.   ACTIVITY LIMITATIONS: lifting and standing  PARTICIPATION LIMITATIONS: cleaning, laundry, shopping, community activity, and occupation  PERSONAL FACTORS: Fitness are also affecting patient's functional outcome.   REHAB POTENTIAL: Excellent  CLINICAL DECISION MAKING: Stable/uncomplicated  EVALUATION COMPLEXITY: Low   GOALS: Goals reviewed with patient? Yes  SHORT TERM GOALS: Target date: 09/26/23  Patient independent with initial HEP for flexibility and diaphragmatic  breathing.  Baseline: Goal status: Met 10/04/23  2.  Patient educated on correct lifting technique to reduce strain on the pelvic floor.  Baseline:  Goal status: INITIAL  3.  Patient educated on perineal massage to reduce trigger points.  Baseline:  Goal status: INITIAL   LONG TERM GOALS: Target date: 02/1024  Patient independent with advanced HEP for core and pelvic floor strength.  Baseline:  Goal status: INITIAL  2.  Patient able to lift 20# with minimal to no pain due to reduction of trigger points in the pelvic floor.  Baseline:  Goal status: INITIAL  3.  Patient reports minimal to no pain after penile penetration due to the ability to lengthen the pelvic floor.  Baseline:  Goal status: INITIAL  4.  Patient reports her lower abdominal pain decreased </= 0-1/10 with standing 1-2 hours at work due to improve strength of the core and hips.  Baseline:  Goal status: INITIAL   PLAN:  PT FREQUENCY: 1x/week  PT DURATION: 6 months  PLANNED INTERVENTIONS: 97110-Therapeutic exercises, 97530- Therapeutic activity, 97112- Neuromuscular re-education, 97140- Manual therapy, 97014- Electrical stimulation (unattended), 97035- Ultrasound, Patient/Family education, Taping, Dry Needling, Cryotherapy, Moist heat, and Biofeedback  PLAN FOR NEXT SESSION:   pelvic floor work and educated patient on lifting correctly, start to work on the outside of the pelvic floor   Channing Pereyra, PT 10/04/23  11:29 AM

## 2023-10-10 NOTE — Progress Notes (Unsigned)
   RETURN GYNECOLOGY VISIT  Subjective:  Connie Carlson is a 43 y.o. G1P1001 with LMP 09/14/23 presenting for follow up of pelvic pain and irregular periods  From 05/19/23: Saw Dr. Penne Lash around 1 year ago with several months of lower abdominal/pelvic pain and irregular periods. Had tried COCs in the past without relief. Tried provera with improvement in her pain & bleeding. Work up included, E2 < 15, EMB (benign) and pelvic US w/ 4mm hyperechoic nodule in the upper uterine segment. Repeat US showed resolution of the nodule. Pt ultimately discontinued her COCs (hx HTN). I saw her 8/29 where she reported persistent irregular periods and pelvic pain radiating to her back. Worse with bending, lifting, intercourse. Her w/u of irregular bleeding is most c/w menopausal transition and her pain seemed to be related to myofascial pain. Planned to track cycles, due PFPT and rx for flexeril to use sparingly was sent.   TODAY: Has done 3 sessions of PFPT and is planned for weekly PFPT x 6 months. Has noticed improvement, has brought pain down to a 7. She did notice a big difference with flexeril when she used it for acute spasms after intercourse. Has used it 4 times total.  Her job was able to accommodate a 20-25lb lifting restriction, plan to continue this restriction for next 3 months.   Objective:   Vitals:   10/12/23 1006  BP: (!) 149/92  Pulse: 76  Weight: 227 lb (103 kg)  Height: 5\' 1"  (1.549 m)    General:  Alert, oriented and cooperative. Patient is in no acute distress.  Skin: Skin is warm and dry. No rash noted.   Cardiovascular: Normal heart rate noted  Respiratory: Normal respiratory effort, no problems with respiration noted   Assessment and Plan:  Connie Carlson is a 43 y.o. with myofascial pelvic pain, improving but not yet at goal in terms of pain control  Continue PFPT Continue 20-25lb lifting restriction at work Continue flexeril prn, using sparingly as directed  Total encounter  time: 16 minutes  Return in about 3 months (around 01/10/2024) for follow up pelvic pain.  Future Appointments  Date Time Provider Department Center  10/14/2023  8:00 AM Teofilo Pod, [facility] None  10/25/2023  8:30 AM Theressa Millard, PT [facility]  10/28/2023  8:00 AM Teofilo Pod, [facility] None  11/11/2023  8:00 AM Teofilo Pod, [facility] None  11/25/2023  8:00 AM Teofilo Pod, [facility] None  11/29/2023  2:00 PM Theressa Millard, PT [facility]  12/09/2023  8:00 AM Teofilo Pod, [facility] None  12/13/2023 11:30 AM Theressa Millard, PT [facility]  12/20/2023 10:30 AM Theressa Millard, PT [facility]   Lennart Pall, MD

## 2023-10-11 ENCOUNTER — Encounter: Payer: 59 | Admitting: Physical Therapy

## 2023-10-11 ENCOUNTER — Encounter: Payer: Self-pay | Admitting: Physical Therapy

## 2023-10-11 DIAGNOSIS — R102 Pelvic and perineal pain: Secondary | ICD-10-CM

## 2023-10-11 DIAGNOSIS — M6281 Muscle weakness (generalized): Secondary | ICD-10-CM

## 2023-10-11 DIAGNOSIS — R252 Cramp and spasm: Secondary | ICD-10-CM

## 2023-10-11 DIAGNOSIS — R1030 Lower abdominal pain, unspecified: Secondary | ICD-10-CM

## 2023-10-11 NOTE — Therapy (Signed)
OUTPATIENT PHYSICAL THERAPY FEMALE PELVIC TREATMENT   Patient Name: Connie Carlson MRN: [id number] DOB:December 17, 1980, 43 y.o., female Today's Date: 10/11/2023  END OF SESSION:  PT End of Session - 10/11/23 1037     Visit Number 4    Date for PT Re-Evaluation 02/28/24    Authorization Type [employer]    PT Start Time 1030    PT Stop Time 1115    PT Time Calculation (min) 45 min    Activity Tolerance Patient tolerated treatment well    Behavior During Therapy [facility] for tasks assessed/performed             Past Medical History:  Diagnosis Date   High blood pressure    Past Surgical History:  Procedure Laterality Date   CHOLECYSTECTOMY     mole remove     under right breast by [facility] - skin cancer   Patient Active Problem List   Diagnosis Date Noted   Other insomnia 03/30/2023   Generalized obesity 03/30/2023   BMI 37.0-37.9, adult 03/30/2023   Elevated glucose 03/30/2023   Vitamin D deficiency 03/30/2023   Health care maintenance 03/30/2023   SOB (shortness of breath) on exertion 03/30/2023   Other fatigue 03/30/2023   Major depressive disorder, recurrent episode, mild (HCC) 08/26/2022   Stress and adjustment reaction 06/29/2022   Anal fissure 01/13/2022   Depression, recurrent (HCC) 04/07/2021   Hypertension 06/11/2020   Severe obesity (BMI 35.0-39.9) with comorbidity (HCC) 06/11/2020   IFG (impaired fasting glucose) 10/08/2013   Migraine headache without aura 02/16/2013   WEIGHT GAIN 01/01/2009   Allergic rhinitis 06/28/2006   GASTROESOPHAGEAL REFLUX, NO ESOPHAGITIS 06/28/2006    PCP: Agapito Games, MD  REFERRING PROVIDER: Lennart Pall, MD   REFERRING DIAG: [phone] (ICD-10-CM) - Pelvic floor dysfunction   THERAPY DIAG:  Cramp and spasm  Pelvic pain  Lower abdominal pain  Muscle weakness (generalized)  Rationale for Evaluation and Treatment: Rehabilitation  ONSET DATE: 1/23  SUBJECTIVE:                                                                                                                                                                                            SUBJECTIVE STATEMENT: Leaked with coughing. Had some lower abdominal pain with coughing.  I have been doing the exercises. The pain is not as strong.  No changes from the initial eval.  PAIN:  Are you having pain? Yes NPRS scale: 6/10 Pain location:  lower abdominal area  Pain type: pressure, like something is going to come out of the vagina Pain description: intermittent   Aggravating factors: lifting heavy items,  standing several hours, carrying and bending over Relieving factors: Advil  PRECAUTIONS: None  RED FLAGS: None   WEIGHT BEARING RESTRICTIONS: No  FALLS:  Has patient fallen in last 6 months? No  LIVING ENVIRONMENT: Lives with: lives with their family  OCCUPATION: works at [employer] with a lot of lifting.   PLOF: Independent  PATIENT GOALS: reduce pain  PERTINENT HISTORY:  Cholecystectomy Sexual abuse: No  BOWEL MOVEMENT: no issues  URINATION: Pain with urination: No Fully empty bladder: Yes:   Stream: Strong Urgency: No Frequency: average Leakage: Coughing Pads: No  INTERCOURSE: Pain with intercourse: After Intercourse, 10/10 Ability to have vaginal penetration:  Yes:   Climax: not when having pain Marinoff Scale: 2/3  PREGNANCY: Vaginal deliveries 1 Tearing No  OBJECTIVE:  Note: Objective measures were completed at Evaluation unless otherwise noted.  DIAGNOSTIC FINDINGS:  Ultrasound negative; biopsy is negative   COGNITION: Overall cognitive status: Within functional limits for tasks assessed     SENSATION: Light touch: Appears intact Proprioception: Appears intact   POSTURE: No Significant postural limitations  PELVIC ALIGNMENT:  LUMBARAROM/PROM: full lumbar ROM   LOWER EXTREMITY ROM: full bilateral hip ROM   LOWER EXTREMITY MMT: Bilateral hip strength is 4/5.    PALPATION:    General  tenderness located in the lower abdomen, decreased movement of the lower rib cage, good mobility of the diaphragm, tenderness in the buttocks                External Perineal Exam tenderness located in the bulbocavernosus, ischiocavernosus, perineal body                             Internal Pelvic Floor levator ani, obturator internist, side of the introitus, sides of the bladder  Patient confirms identification and approves PT to assess internal pelvic floor and treatment Yes  PELVIC MMT:   MMT eval 10/11/23  Vaginal 3/5 holding for 6 seconds 3/5  (Blank rows = not tested)        TONE: increased  PROLAPSE: none  TODAY'S TREATMENT:      10/11/23 Manual: Soft tissue mobilization: Manual work to bilateral hip adductors to improve tissue mobility Manual work to the urogenital diaphragm, along the perineal body, levator ani, and ischiocavernosus to lengthen the tissue Internal pelvic floor techniques: No emotional/communication barriers or cognitive limitation. Patient is motivated to learn. Patient understands and agrees with treatment goals and plan. PT explains patient will be examined in standing, sitting, and lying down to see how their muscles and joints work. When they are ready, they will be asked to remove their underwear so PT can examine their perineum. The patient is also given the option of providing their own chaperone as one is not provided in our facility. The patient also has the right and is explained the right to defer or refuse any part of the evaluation or treatment including the internal exam. With the patient's consent, PT will use one gloved finger to gently assess the muscles of the pelvic floor, seeing how well it contracts and relaxes and if there is muscle symmetry. After, the patient will get dressed and PT and patient will discuss exam findings and plan of care. PT and patient discuss plan of care, schedule, attendance policy and HEP activities.   Therapist using her index finger going through the vaginal canal working on the levator ani and puborectalis with hip movements to lengthen the tissue and monitor for pain.  Neuromuscular re-education: Down training: Diaphragmatic breathing to work on bulging the pelvic floor  Therapist finger in the vaginal canal working on relaxing the pelvic floor after contraction and to be able to feel what relaxation feels like Exercises: Stretches/mobility: Single knee to chest holding 30 sec bil.  1/2 happy baby stretch holding 30 sec bil.  Pull knee across the trunk holding 30 sec bil.  Therapeutic activities: Functional strengthening activities: Educated patient on squatting with core engagement and breathing out with lifting to reduce strain on pelvic floor.     10/04/23 Manual: Soft tissue mobilization: Annual work to the diaphragm, along the upper abdominals, latissimus, lower lateral intercoastals Myofascial release: ascial release along the mesenteric root, along the umbilicus, urachus ligament, Spinal mobilization: Laying on side with therapist mobilizing the lower rib cage to improve opening Neuromuscular re-education: Down training: Diaphragmatic breathing to open up the pelvic floor in supine Exercises: Stretches/mobility: Supine figure 4 stretch holding 30 sec bil.  Supine hip flexor stretch holding 30 sec bil. Using a strap Sidely open book 10 x each side with tactile cues to increase stretch and then cues to contract abdominals when going back to start position     09/27/23 Manual: Soft tissue mobilization: Circular massage to the abdomen to improve tissue mobility Educated patient on how to lift her tissue up off the bladder in supine to reduce pressure and pain Myofascial release: Fascial release along the mesenteric root, along the umbilicus, urachus ligament, pulling tissue up off the pubic bone in supine Fascial release around the lower abdomen in quadruped with  patient going back and forth, mobilization of tissue off the bladder and by the uterus Tissue rolling along the lower rib cage Supine with release around the bladder and uterus with knees moving side to side Exercises: Stretches/mobility: Marjo Bicker pose 5 times with therapist assisting for her to bring buttocks to the feet Cat cow with tactile cues to posterior tilt the pelvis Supine diaphragmatic breathing with tactile cues on the posterior rib cage to expand the tissue and hands on the lower rib cage to move the rib cage downward    PATIENT EDUCATION:  10/04/23 Education details: Access Code: [email or web address], information on dry needling Person educated: Patient Education method: Explanation, Demonstration, Tactile cues, Verbal cues, and Handouts Education comprehension: verbalized understanding, returned demonstration, verbal cues required, tactile cues required, and needs further education  HOME EXERCISE PROGRAM: 10/04/23 Access Code: [email or web address] URL: [email or web address] Date: 10/04/2023 Prepared by: [name]  Exercises -- Modified Thomas Stretch  - 1 x daily - 7 x weekly - 1 sets - 1 reps - 30 sec hold - Supine Diaphragmatic Breathing  - 1 x daily - 2 x weekly - 1 sets - 10 reps - Sidelying Thoracic Rotation with Open Book  - 1 x daily - 2 x weekly - 1 sets - 10 reps  Patient Education - Trigger Point Dry Needling  Patient Education - Trigger Point Dry Needling  ASSESSMENT:  CLINICAL IMPRESSION: Patient is a 43 y.o. female who was seen today for physical therapy  treatment for pelvic floor dysfunction.  Patient was able to fully relax her pelvic floor after a contraction and feel the relaxation. She has multiple trigger points in the pelvic floor with left side having the most. Pain decreased from 6/10 to 5/10 with the manual work. She leaked urine when coughing last week and had a pulling pain in the lower abdominal area.  Patient will benefit from skilled therapy to  reduce her pain  to restore her function.    OBJECTIVE IMPAIRMENTS: decreased activity tolerance, decreased strength, increased fascial restrictions, increased muscle spasms, and pain.   ACTIVITY LIMITATIONS: lifting and standing  PARTICIPATION LIMITATIONS: cleaning, laundry, shopping, community activity, and occupation  PERSONAL FACTORS: Fitness are also affecting patient's functional outcome.   REHAB POTENTIAL: Excellent  CLINICAL DECISION MAKING: Stable/uncomplicated  EVALUATION COMPLEXITY: Low   GOALS: Goals reviewed with patient? Yes  SHORT TERM GOALS: Target date: 09/26/23  Patient independent with initial HEP for flexibility and diaphragmatic  breathing.  Baseline: Goal status: Met 10/04/23  2.  Patient educated on correct lifting technique to reduce strain on the pelvic floor.  Baseline:  Goal status: INITIAL  3.  Patient educated on perineal massage to reduce trigger points.  Baseline:  Goal status: INITIAL   LONG TERM GOALS: Target date: 02/1024  Patient independent with advanced HEP for core and pelvic floor strength.  Baseline:  Goal status: INITIAL  2.  Patient able to lift 20# with minimal to no pain due to reduction of trigger points in the pelvic floor.  Baseline:  Goal status: INITIAL  3.  Patient reports minimal to no pain after penile penetration due to the ability to lengthen the pelvic floor.  Baseline:  Goal status: INITIAL  4.  Patient reports her lower abdominal pain decreased </= 0-1/10 with standing 1-2 hours at work due to improve strength of the core and hips.  Baseline:  Goal status: INITIAL   PLAN:  PT FREQUENCY: 1x/week  PT DURATION: 6 months  PLANNED INTERVENTIONS: 97110-Therapeutic exercises, 97530- Therapeutic activity, [id number]- Neuromuscular re-education, 97140- Manual therapy, 97014- Electrical stimulation (unattended), 97035- Ultrasound, Patient/Family education, Taping, Dry Needling, Cryotherapy, Moist heat, and  Biofeedback  PLAN FOR NEXT SESSION:   pelvic floor work,  educated patient on lifting correctly, see how it went with doctor, core strength  [name], PT 10/11/23 11:39 AM

## 2023-10-12 ENCOUNTER — Encounter: Payer: Self-pay | Admitting: Obstetrics and Gynecology

## 2023-10-12 ENCOUNTER — Ambulatory Visit: Payer: 59 | Admitting: Obstetrics and Gynecology

## 2023-10-12 VITALS — BP 149/92 | HR 76 | Ht 61.0 in | Wt 227.0 lb

## 2023-10-12 DIAGNOSIS — G8929 Other chronic pain: Secondary | ICD-10-CM | POA: Diagnosis not present

## 2023-10-12 DIAGNOSIS — R102 Pelvic and perineal pain: Secondary | ICD-10-CM

## 2023-10-12 NOTE — Progress Notes (Signed)
Pt has noticed improvement in pain since starting PT

## 2023-10-14 ENCOUNTER — Ambulatory Visit: Payer: 59 | Admitting: Professional

## 2023-10-14 ENCOUNTER — Encounter: Payer: Self-pay | Admitting: Professional

## 2023-10-14 DIAGNOSIS — F331 Major depressive disorder, recurrent, moderate: Secondary | ICD-10-CM | POA: Diagnosis not present

## 2023-10-14 DIAGNOSIS — F4322 Adjustment disorder with anxiety: Secondary | ICD-10-CM | POA: Diagnosis not present

## 2023-10-14 NOTE — Progress Notes (Addendum)
 [facility] Counselor/Therapist Progress Note  Patient ID: Connie Carlson, MRN: [id number],    Prefers to go by Connie Carlson, her middle name  Date: 10/14/2023  Time Spent:  49 minutes 810-859am   Treatment Type: Individual Therapy  Risk Assessment: Danger to Self:  No Self-injurious Behavior: No Danger to Others: No  Subjective: The patient arrived late for her inperson appointment.   Issues addressed: 1-professional -pt on leave of absence from work due to lifting limitations and employer unable to accommodate -pt has a lot of unstructured time and finds herself sleeping a lot -pt employer has been as accommodating as possible 2-mood -depressed but not as difficult as in December -pt reports she has increased sleep but thinks it is related to lack of structure 3-coping skills -how to structure her time while off work -plan for staying focused on positives   Treatment Plan Problems Addressed  Career Success Obstacles, Depression, Low Self-Esteem/Lack of Assertiveness, Partner Relational Problems  Goals 1. Develop a sense of own personal power and self-esteem within the relationship. 2. Develop advocacy and empowerment skills that involve taking a proactive stance in the work environment. 3. Develop assertiveness skills and techniques for application in work-related activities (e.g., establishing boundaries with coworkers, Garment/textile technologist). 4. Develop healthy cognitive mechanisms to facilitate positive attitudes and beliefs about self within the context of one's environment to mitigate depressive symptoms. Objective Articulate signs and symptoms of depression in current life experiences. Target Date: 2024-10-06 Frequency: Biweekly  Progress: 70 Modality: individual  Related Interventions Encourage the client to share her feelings of depression to gain an insight into precipitating events and implications of symptoms; normalize her feelings of  depression. Objective Implement behavioral interventions to overcome depression. Target Date: 2024-10-06 Frequency: Biweekly  Progress: 30 Modality: individual  Related Interventions Assign the client to participate as fully as possible in a healthy exercise regimen. Encourage the client to continue participation in positive social support systems. Objective Identify and replace cognitive self-talk that supports depression. Target Date: 2024-10-06 Frequency: Biweekly  Progress: 30 Modality: individual  Related Interventions Reinforce the client's positive, reality-based cognitive messages that enhance self-confidence and increase adaptive action (see "Positive Self-Talk" in the Adult Psychotherapy Homework Planner, 2nd ed. by Stephannie Li). Do "behavioral experiments" in which depressive automatic thoughts are treated as hypotheses/predictions, reality-based alternative hypotheses/ predictions are generated, and both are tested against the client's past, present, and/or future experiences. Encourage the client to discuss cognitive distortions, including automatic thoughts (e.g., negative view of self, future, experience) and negative schemas (e.g., core beliefs about self and others based on earlier childhood experiences); assess frequency of negative self-statements associated with depression. Objective Increase the frequency of engaging in pleasant activities. Target Date: 2024-10-06 Frequency: Biweekly  Progress: 30 Modality: individual  Related Interventions Assign the client reading materials regarding overcoming depression for women (e.g., Women and Depression: A Practical Self-Help Guide by Allyne Gee; Feeling Good by Lawerance Bach; Women & Depression by Collier Bullock; Silencing the Self: Women and Depression by Ree Kida). Objective Identify precipitating events and factors of depression, particularly roles of self and others in depression. Target Date: 2024-10-06 Frequency: Biweekly  Progress: 80 Modality:  individual  Related Interventions Explore the degree to which primary symptoms of depression are a result of gender role socialization (e.g., passivity, decreased self-esteem, learned helplessness, interpersonal orientation towards pleasing others). Evaluate symptoms to consider the presence of culture-bound syndromes and/or culture specificity of symptoms (e.g., ataques de nervios, amok, postemigration effects). Objective Increase the level of physical exercise. Target Date: 2024-10-06 Frequency:  Biweekly  Progress: 10 Modality: individual  Objective Increase social contacts and communicate needs within existing interpersonal relationships. Target Date: 2024-10-06 Frequency: Biweekly  Progress: 30 Modality: individual  5. Explore career options that have been automatically ruled out due to low self-concept or limited opportunities. Objective Identify and replace at least three negative self-talk statements that perpetuate feelings of powerlessness or hopelessness. Target Date: 2024-10-06 Frequency: Biweekly  Progress: 10 Modality: individual  Objective Identify and replace cognitive self-talk that contributes to all-or-nothing thinking, gender-role stereotypes, or low career self-efficacy. Target Date: 2023-10-07 Frequency: Biweekly  Progress: 10 Modality: individual  Objective Develop career-related strategies to overcome and persist in the face of obstacles. Target Date: 2024-10-06 Frequency: Biweekly  Progress: 30 Modality: individual  Objective Increase self-esteem and self-concept by exploring and encompassing other roles/identities besides those of career. Target Date: 2024-10-06 Frequency: Biweekly  Progress: 20 Modality: individual  Objective Identify barriers and personal benefits to balancing career and family life. Target Date: 2024-10-06 Frequency: Biweekly  Progress: 60 Modality: individual  6. Identify and increase intrapersonal, interpersonal, and physical resources  to foster positive coping strategies. 7. Improve depressed mood to maximize effective social, occupational, and physical functioning. 8. Improve self-esteem and develop a positive self-image as capable and competent. 9. Increase ability to express needs and desires openly and honestly. 10. Increase assertiveness skills and ability to advocate for self. 11. Increase awareness of own role in relationship conflicts. Objective Identify a pattern of repeatedly forming destructive intimate relationships. Target Date: 2024-10-06 Frequency: Biweekly  Progress: 60 Modality: individual  12. Increase involvement in activities that foster confidence and a sense of accomplishment. Objective Acknowledge self-disparaging statements and recognize the tendency to engage in such statements. Target Date: 2024-10-06 Frequency: Biweekly  Progress: 60 Modality: individual  Related Interventions Ask the client to complete and process an exercise in the book Ten Days to Self-Esteem! Lawerance Bach). Objective Verbalize an understanding of the differences between passive, assertive, and aggressive behavior. Target Date: 2024-10-06 Frequency: Biweekly  Progress: 50 Modality: individual  Objective Describe personal history of self-devaluation and lack of assertiveness. Target Date: 2024-10-06 Frequency: Biweekly  Progress: 60 Modality: individual  Related Interventions Use cognitive-restructuring techniques to change the client's belief system (i.e., via reality-testing and rational thought) toward a more positive, realistic self-perception. Assist the client in becoming aware of how she indirectly expresses negative feelings about self (e.g., lack of eye contact, social withdrawal, sweating, expectations of failure or rejection). Assist the client in identifying negative beliefs about herself. Discuss context of self-disparagement, including client's perceptions of self from childhood to present, role of caregivers and  peers, and related experiences. Objective Identify three roadblocks to improved self-esteem. Target Date: 2024-10-06 Frequency: Biweekly  Progress: 0 Modality: individual  Objective   Diagnosis:Major depressive disorder, recurrent episode, moderate (HCC)  Adjustment disorder with anxiety  Plan: -pt wants to talk at next session about her relationship with her father -meet again on Friday, November 04, 2023 at 1pm in person

## 2023-10-25 ENCOUNTER — Encounter: Payer: 59 | Attending: Obstetrics and Gynecology | Admitting: Physical Therapy

## 2023-10-25 ENCOUNTER — Other Ambulatory Visit: Payer: Self-pay | Admitting: Family Medicine

## 2023-10-25 ENCOUNTER — Encounter: Payer: Self-pay | Admitting: Physical Therapy

## 2023-10-25 DIAGNOSIS — M6281 Muscle weakness (generalized): Secondary | ICD-10-CM | POA: Diagnosis present

## 2023-10-25 DIAGNOSIS — R1030 Lower abdominal pain, unspecified: Secondary | ICD-10-CM | POA: Diagnosis present

## 2023-10-25 DIAGNOSIS — R252 Cramp and spasm: Secondary | ICD-10-CM | POA: Diagnosis present

## 2023-10-25 DIAGNOSIS — R102 Pelvic and perineal pain: Secondary | ICD-10-CM | POA: Insufficient documentation

## 2023-10-25 DIAGNOSIS — F330 Major depressive disorder, recurrent, mild: Secondary | ICD-10-CM

## 2023-10-25 NOTE — Therapy (Signed)
 OUTPATIENT PHYSICAL THERAPY FEMALE PELVIC TREATMENT   Patient Name: Connie Carlson MRN: [id number] DOB:Sep 19, 1981, 43 y.o., female Today's Date: 10/25/2023  END OF SESSION:  PT End of Session - 10/25/23 0837     Visit Number 5    Date for PT Re-Evaluation 02/28/24    PT Start Time 0830    PT Stop Time 0915    PT Time Calculation (min) 45 min    Activity Tolerance Patient tolerated treatment well    Behavior During Therapy [facility] for tasks assessed/performed             Past Medical History:  Diagnosis Date   High blood pressure    Past Surgical History:  Procedure Laterality Date   CHOLECYSTECTOMY     mole remove     under right breast by [facility] - skin cancer   Patient Active Problem List   Diagnosis Date Noted   Other insomnia 03/30/2023   Generalized obesity 03/30/2023   BMI 37.0-37.9, adult 03/30/2023   Elevated glucose 03/30/2023   Vitamin D  deficiency 03/30/2023   Health care maintenance 03/30/2023   SOB (shortness of breath) on exertion 03/30/2023   Other fatigue 03/30/2023   Major depressive disorder, recurrent episode, mild (HCC) 08/26/2022   Stress and adjustment reaction 06/29/2022   Anal fissure 01/13/2022   Depression, recurrent (HCC) 04/07/2021   Hypertension 06/11/2020   Severe obesity (BMI 35.0-39.9) with comorbidity (HCC) 06/11/2020   IFG (impaired fasting glucose) 10/08/2013   Migraine headache without aura 02/16/2013   WEIGHT GAIN 01/01/2009   Allergic rhinitis 06/28/2006   GASTROESOPHAGEAL REFLUX, NO ESOPHAGITIS 06/28/2006    PCP: Alvan Dorothyann BIRCH, MD  REFERRING PROVIDER: Erik Kieth JAYSON, MD   REFERRING DIAG: [phone] (ICD-10-CM) - Pelvic floor dysfunction   THERAPY DIAG:  Cramp and spasm  Pelvic pain  Lower abdominal pain  Muscle weakness (generalized)  Rationale for Evaluation and Treatment: Rehabilitation  ONSET DATE: 1/23  SUBJECTIVE:                                                                                                                                                                                            SUBJECTIVE STATEMENT: Saw the Obgyn and kept me on the restrictions. I have been out of work since the 1/23. Plan on going back to work on how the therapy goes. The lower abdominal pain is doing better. MD put me on restrictions including not standing long periods of time, no lifting more than 20 pounds, no bending. Breaks every 2 hours. I still feel like something is coming out of the vaginal opening. Sneeze or cough urine comes  out. Patient reports she does not try to have intercourse because she is afraid of the pain. I have been dealing with my depression for 8 months.    PAIN:  Are you having pain? Yes NPRS scale: 5/10; 10/25/23 Pain location:  lower abdominal area  Pain type: pressure, like something is going to come out of the vagina Pain description: intermittent   Aggravating factors: lifting heavy items, standing several hours, carrying and bending over Relieving factors: Advil  PRECAUTIONS: None  RED FLAGS: None   WEIGHT BEARING RESTRICTIONS: No  FALLS:  Has patient fallen in last 6 months? No  LIVING ENVIRONMENT: Lives with: lives with their family  OCCUPATION: works at [employer] with a lot of lifting.   PLOF: Independent  PATIENT GOALS: reduce pain  PERTINENT HISTORY:  Cholecystectomy Sexual abuse: No  BOWEL MOVEMENT: no issues  URINATION: Pain with urination: No Fully empty bladder: Yes:   Stream: Strong Urgency: No Frequency: average Leakage: Coughing Pads: No  INTERCOURSE: Pain with intercourse: After Intercourse, 10/10 Ability to have vaginal penetration:  Yes:   Climax: not when having pain Marinoff Scale: 2/3  PREGNANCY: Vaginal deliveries 1 Tearing No  OBJECTIVE:  Note: Objective measures were completed at Evaluation unless otherwise noted.  DIAGNOSTIC FINDINGS:  Ultrasound negative; biopsy is  negative   COGNITION: Overall cognitive status: Within functional limits for tasks assessed     SENSATION: Light touch: Appears intact Proprioception: Appears intact   POSTURE: No Significant postural limitations  PELVIC ALIGNMENT:  LUMBARAROM/PROM: full lumbar ROM   LOWER EXTREMITY ROM: full bilateral hip ROM   LOWER EXTREMITY MMT: Bilateral hip strength is 4/5.    PALPATION:   General  tenderness located in the lower abdomen, decreased movement of the lower rib cage, good mobility of the diaphragm, tenderness in the buttocks                External Perineal Exam tenderness located in the bulbocavernosus, ischiocavernosus, perineal body                             Internal Pelvic Floor levator ani, obturator internist, side of the introitus, sides of the bladder  Patient confirms identification and approves PT to assess internal pelvic floor and treatment Yes  PELVIC MMT:   MMT eval 10/11/23  Vaginal 3/5 holding for 6 seconds 3/5  (Blank rows = not tested)        TONE: increased  PROLAPSE: none  TODAY'S TREATMENT:      10/25/23 Manual: Internal pelvic floor techniques: No emotional/communication barriers or cognitive limitation. Patient is motivated to learn. Patient understands and agrees with treatment goals and plan. PT explains patient will be examined in standing, sitting, and lying down to see how their muscles and joints work. When they are ready, they will be asked to remove their underwear so PT can examine their perineum. The patient is also given the option of providing their own chaperone as one is not provided in our facility. The patient also has the right and is explained the right to defer or refuse any part of the evaluation or treatment including the internal exam. With the patient's consent, PT will use one gloved finger to gently assess the muscles of the pelvic floor, seeing how well it contracts and relaxes and if there is muscle symmetry. After,  the patient will get dressed and PT and patient will discuss exam findings and plan of care. PT and  patient discuss plan of care, schedule, attendance policy and HEP activities.  Placing therapist finger into the vaginal canal working on the levator ani and obturator internist with hip movements Manual work on the sides of the bladder and urethra Exercises: Stretches/mobility: Sidely open book 10 x each side with tactile cues to increase stretch and then cues to contract abdominals when going back to start position Pull knee across the trunk holding 30 sec bil. Single knee to chest holding 30 sec bil.  Prone press ups 5 times Diaphragmatic breathing to perform pelvic drop for relaxation of the pelvic floor with therapist finger in the vaginal canal to feel the muscles Strengthening: Educated patient on walking program for 15 minutes per day to work on her endurance and strength to return to work Self-care: Educated patient on vaginal wand, how it is used on the pelvic floor using the pelvic model, and where she can purchase one to reduce trigger points in the pelvic floor    10/11/23 Manual: Soft tissue mobilization: Manual work to bilateral hip adductors to improve tissue mobility Manual work to the urogenital diaphragm, along the perineal body, levator ani, and ischiocavernosus to lengthen the tissue Internal pelvic floor techniques: No emotional/communication barriers or cognitive limitation. Patient is motivated to learn. Patient understands and agrees with treatment goals and plan. PT explains patient will be examined in standing, sitting, and lying down to see how their muscles and joints work. When they are ready, they will be asked to remove their underwear so PT can examine their perineum. The patient is also given the option of providing their own chaperone as one is not provided in our facility. The patient also has the right and is explained the right to defer or refuse any part of  the evaluation or treatment including the internal exam. With the patient's consent, PT will use one gloved finger to gently assess the muscles of the pelvic floor, seeing how well it contracts and relaxes and if there is muscle symmetry. After, the patient will get dressed and PT and patient will discuss exam findings and plan of care. PT and patient discuss plan of care, schedule, attendance policy and HEP activities.  Therapist using her index finger going through the vaginal canal working on the levator ani and puborectalis with hip movements to lengthen the tissue and monitor for pain.  Neuromuscular re-education: Down training: Diaphragmatic breathing to work on bulging the pelvic floor  Therapist finger in the vaginal canal working on relaxing the pelvic floor after contraction and to be able to feel what relaxation feels like Exercises: Stretches/mobility: Single knee to chest holding 30 sec bil.  1/2 happy baby stretch holding 30 sec bil.  Pull knee across the trunk holding 30 sec bil.  Therapeutic activities: Functional strengthening activities: Educated patient on squatting with core engagement and breathing out with lifting to reduce strain on pelvic floor.     10/04/23 Manual: Soft tissue mobilization: Annual work to the diaphragm, along the upper abdominals, latissimus, lower lateral intercoastals Myofascial release: ascial release along the mesenteric root, along the umbilicus, urachus ligament, Spinal mobilization: Laying on side with therapist mobilizing the lower rib cage to improve opening Neuromuscular re-education: Down training: Diaphragmatic breathing to open up the pelvic floor in supine Exercises: Stretches/mobility: Supine figure 4 stretch holding 30 sec bil.  Supine hip flexor stretch holding 30 sec bil. Using a strap Sidely open book 10 x each side with tactile cues to increase stretch and then cues to contract  abdominals when going back to start position      PATIENT EDUCATION:  10/25/23 Education details: Access Code: [email or web address], information on dry needling, information on vaginal wand, educated patient on walking 15 min per day Person educated: Patient Education method: Explanation, Demonstration, Tactile cues, Verbal cues, and Handouts Education comprehension: verbalized understanding, returned demonstration, verbal cues required, tactile cues required, and needs further education  HOME EXERCISE PROGRAM: 10/25/23 Access Code: [email or web address] URL: [email or web address] Date: 10/25/2023 Prepared by: Channing Pereyra  Exercises - Seated Piriformis Stretch with Trunk Bend  - 1 x daily - 7 x weekly - 1 sets - 1 reps - 30 sec hold - Seated Hamstring Stretch  - 1 x daily - 7 x weekly - 1 sets - 1 reps - 30 sec hold - Seated Happy Baby With Trunk Flexion For Pelvic Relaxation  - 1 x daily - 7 x weekly - 1 sets - 1 reps - 30 sec hold - Cat Cow  - 1 x daily - 7 x weekly - 1 sets - 10 reps - Diaphragmatic Breathing in Child's Pose with Pelvic Floor Relaxation  - 1 x daily - 7 x weekly - 1 sets - 5 reps - 10 sec hold - Supine Diaphragmatic Breathing  - 1 x daily - 7 x weekly - 1 sets - 10 reps - Sidelying Thoracic Rotation with Open Book  - 1 x daily - 2 x weekly - 1 sets - 10 reps   ASSESSMENT:  CLINICAL IMPRESSION: Patient is a 43 y.o. female who was seen today for physical therapy  treatment for pelvic floor dysfunction. Patient is presently out of work due to the continued pain and falling out feeling vaginally. She has been depressed and difficult for her to exercise. Therapist and patient came up with a plan for her to try to exercise daily with walking 15 minutes per day and several exercises.  She continues to have tenderness throughout the pelvic floor and around the bladder and urethra. She has difficulty with relaxing the pelvic floor with diaphragmatic breathing. Patient is not having intercourse due to fear of pain. Patient will benefit  from skilled therapy to reduce her pain to restore her function.    OBJECTIVE IMPAIRMENTS: decreased activity tolerance, decreased strength, increased fascial restrictions, increased muscle spasms, and pain.   ACTIVITY LIMITATIONS: lifting and standing  PARTICIPATION LIMITATIONS: cleaning, laundry, shopping, community activity, and occupation  PERSONAL FACTORS: Fitness are also affecting patient's functional outcome.   REHAB POTENTIAL: Excellent  CLINICAL DECISION MAKING: Stable/uncomplicated  EVALUATION COMPLEXITY: Low   GOALS: Goals reviewed with patient? Yes  SHORT TERM GOALS: Target date: 09/26/23  Patient independent with initial HEP for flexibility and diaphragmatic  breathing.  Baseline: Goal status: Met 10/04/23  2.  Patient educated on correct lifting technique to reduce strain on the pelvic floor.  Baseline:  Goal status: INITIAL  3.  Patient educated on perineal massage to reduce trigger points.  Baseline: educated patient on vaginal wand Goal status: ongoing   LONG TERM GOALS: Target date: 02/1024  Patient independent with advanced HEP for core and pelvic floor strength.  Baseline:  Goal status: INITIAL  2.  Patient able to lift 20# with minimal to no pain due to reduction of trigger points in the pelvic floor.  Baseline:  Goal status: INITIAL  3.  Patient reports minimal to no pain after penile penetration due to the ability to lengthen the pelvic floor.  Baseline:  Goal status: INITIAL  4.  Patient reports  her lower abdominal pain decreased </= 0-1/10 with standing 1-2 hours at work due to improve strength of the core and hips.  Baseline:  Goal status: INITIAL   PLAN:  PT FREQUENCY: 1x/week  PT DURATION: 6 months  PLANNED INTERVENTIONS: 97110-Therapeutic exercises, 97530- Therapeutic activity, [id number]- Neuromuscular re-education, 97140- Manual therapy, 97014- Electrical stimulation (unattended), 97035- Ultrasound, Patient/Family education, Taping,  Dry Needling, Cryotherapy, Moist heat, and Biofeedback  PLAN FOR NEXT SESSION:   pelvic floor work,  educated patient on lifting correctly, core strength, see if she has gotten the wand, foam rolling of the hips  Channing Pereyra, PT 10/25/23 9:32 AM

## 2023-10-27 ENCOUNTER — Other Ambulatory Visit: Payer: Self-pay | Admitting: Family Medicine

## 2023-10-27 DIAGNOSIS — I10 Essential (primary) hypertension: Secondary | ICD-10-CM

## 2023-10-28 ENCOUNTER — Ambulatory Visit: Payer: 59 | Admitting: Professional

## 2023-11-01 ENCOUNTER — Encounter: Payer: 59 | Admitting: Physical Therapy

## 2023-11-01 ENCOUNTER — Encounter: Payer: Self-pay | Admitting: Physical Therapy

## 2023-11-01 DIAGNOSIS — M6281 Muscle weakness (generalized): Secondary | ICD-10-CM

## 2023-11-01 DIAGNOSIS — R252 Cramp and spasm: Secondary | ICD-10-CM | POA: Diagnosis not present

## 2023-11-01 DIAGNOSIS — R1030 Lower abdominal pain, unspecified: Secondary | ICD-10-CM

## 2023-11-01 DIAGNOSIS — R102 Pelvic and perineal pain unspecified side: Secondary | ICD-10-CM

## 2023-11-01 NOTE — Patient Instructions (Signed)
The first picture shows that there is no effect on the pelvic floor with gravity eliminated. The next three show that with a wedge pillow or a few pillows from home under your pelvis the pelvic floor is inverted and may relax and allows gravity to help return prolapsed areas more inward to help relieve symptoms. Do this 15-20 mins every evening when symptoms tend to be worse. Stop if you have pain or negative symptoms.     [name], PT [facility] [address] [place], [place] [address] W: [phone] [name]

## 2023-11-01 NOTE — Therapy (Signed)
OUTPATIENT PHYSICAL THERAPY FEMALE PELVIC TREATMENT   Patient Name: Connie Carlson MRN: [id number] DOB:14-Sep-1981, 43 y.o., female Today's Date: 11/01/2023  END OF SESSION:  PT End of Session - 11/01/23 1034     Visit Number 6    Date for PT Re-Evaluation 02/28/24    Authorization Type [employer]    PT Start Time 0930    PT Stop Time 1025    PT Time Calculation (min) 55 min    Activity Tolerance Patient tolerated treatment well    Behavior During Therapy [facility] for tasks assessed/performed             Past Medical History:  Diagnosis Date   High blood pressure    Past Surgical History:  Procedure Laterality Date   CHOLECYSTECTOMY     mole remove     under right breast by [facility] - skin cancer   Patient Active Problem List   Diagnosis Date Noted   Other insomnia 03/30/2023   Generalized obesity 03/30/2023   BMI 37.0-37.9, adult 03/30/2023   Elevated glucose 03/30/2023   Vitamin D deficiency 03/30/2023   Health care maintenance 03/30/2023   SOB (shortness of breath) on exertion 03/30/2023   Other fatigue 03/30/2023   Major depressive disorder, recurrent episode, mild (HCC) 08/26/2022   Stress and adjustment reaction 06/29/2022   Anal fissure 01/13/2022   Depression, recurrent (HCC) 04/07/2021   Hypertension 06/11/2020   Severe obesity (BMI 35.0-39.9) with comorbidity (HCC) 06/11/2020   IFG (impaired fasting glucose) 10/08/2013   Migraine headache without aura 02/16/2013   WEIGHT GAIN 01/01/2009   Allergic rhinitis 06/28/2006   GASTROESOPHAGEAL REFLUX, NO ESOPHAGITIS 06/28/2006    PCP: Agapito Games, MD  REFERRING PROVIDER: Lennart Pall, MD   REFERRING DIAG: [phone] (ICD-10-CM) - Pelvic floor dysfunction   THERAPY DIAG:  Cramp and spasm  Pelvic pain  Lower abdominal pain  Muscle weakness (generalized)  Rationale for Evaluation and Treatment: Rehabilitation  ONSET DATE: 1/23  SUBJECTIVE:                                                                                                                                                                                            SUBJECTIVE STATEMENT: I have been having lower pain. I walked 2 days since last visit for 25 minutes. I did the exercises 2 times.I have been doing a lot of cleaning last week. I felt sore after the manual work last time. I have ordered the vaginal wand.   PAIN:  Are you having pain? Yes NPRS scale: 8/10; 11/01/23 Pain location:  lower abdominal area  Pain type: pressure, like something  is going to come out of the vagina Pain description: intermittent   Aggravating factors: lifting heavy items, standing several hours, carrying and bending over Relieving factors: Advil  PRECAUTIONS: None  RED FLAGS: None   WEIGHT BEARING RESTRICTIONS: No  FALLS:  Has patient fallen in last 6 months? No  LIVING ENVIRONMENT: Lives with: lives with their family  OCCUPATION: works at [employer] with a lot of lifting.   PLOF: Independent  PATIENT GOALS: reduce pain  PERTINENT HISTORY:  Cholecystectomy Sexual abuse: No  BOWEL MOVEMENT: no issues  URINATION: Pain with urination: No Fully empty bladder: Yes:   Stream: Strong Urgency: No Frequency: average Leakage: Coughing Pads: No  INTERCOURSE: Pain with intercourse: After Intercourse, 10/10 Ability to have vaginal penetration:  Yes:   Climax: not when having pain Marinoff Scale: 2/3  PREGNANCY: Vaginal deliveries 1 Tearing No  OBJECTIVE:  Note: Objective measures were completed at Evaluation unless otherwise noted.  DIAGNOSTIC FINDINGS:  Ultrasound negative; biopsy is negative   COGNITION: Overall cognitive status: Within functional limits for tasks assessed     SENSATION: Light touch: Appears intact Proprioception: Appears intact   POSTURE: No Significant postural limitations  PELVIC ALIGNMENT:  LUMBARAROM/PROM: full lumbar ROM   LOWER EXTREMITY ROM: full bilateral hip  ROM   LOWER EXTREMITY MMT: Bilateral hip strength is 4/5.    PALPATION:   General  tenderness located in the lower abdomen, decreased movement of the lower rib cage, good mobility of the diaphragm, tenderness in the buttocks                External Perineal Exam tenderness located in the bulbocavernosus, ischiocavernosus, perineal body                             Internal Pelvic Floor levator ani, obturator internist, side of the introitus, sides of the bladder  Patient confirms identification and approves PT to assess internal pelvic floor and treatment Yes  PELVIC MMT:   MMT eval 10/11/23  Vaginal 3/5 holding for 6 seconds 3/5  (Blank rows = not tested)        TONE: increased  PROLAPSE: none  TODAY'S TREATMENT:      11/01/23 Manual: Soft tissue mobilization: Manual work to the diaphragm  Manual work to the gluteals to lengthen the muscles Manual work to the Tax inspector on her side  working on the trigger points Myofascial release: Fascial release along the lower abdomen, mesenteric root, urachus ligament to release the restrictions and reduce pain Neuromuscular re-education: Form correction: Quadruped with knee on yoga block to stretch out the gluteals and SI joint for mobility by rocking side to side and forward and back Down training: Diaphragmatic breathing to elongate the pelvic floor in supine Childs pose with breathing into back to open up the back rib cage Exercises: Strengthening: Resistive hip flexion increases pain of the lower abdomen Therapeutic activities: Functional strengthening activities: Laying on her back with legs elevated to reduce the pressure in the lower abdomen and massage upward in the lower abdomen. Patient understands to do this 2 times per day for 15 minutes Patient squat 8 times with verbal cues to not flex at waist, breath as she stands up and use her legs to reduce pressure on the pelvic floor.      10/25/23 Manual: Internal pelvic floor techniques: No emotional/communication barriers or cognitive limitation. Patient is motivated to learn. Patient understands and agrees with  treatment goals and plan. PT explains patient will be examined in standing, sitting, and lying down to see how their muscles and joints work. When they are ready, they will be asked to remove their underwear so PT can examine their perineum. The patient is also given the option of providing their own chaperone as one is not provided in our facility. The patient also has the right and is explained the right to defer or refuse any part of the evaluation or treatment including the internal exam. With the patient's consent, PT will use one gloved finger to gently assess the muscles of the pelvic floor, seeing how well it contracts and relaxes and if there is muscle symmetry. After, the patient will get dressed and PT and patient will discuss exam findings and plan of care. PT and patient discuss plan of care, schedule, attendance policy and HEP activities.  Placing therapist finger into the vaginal canal working on the levator ani and obturator internist with hip movements Manual work on the sides of the bladder and urethra Exercises: Stretches/mobility: Sidely open book 10 x each side with tactile cues to increase stretch and then cues to contract abdominals when going back to start position Pull knee across the trunk holding 30 sec bil. Single knee to chest holding 30 sec bil.  Prone press ups 5 times Diaphragmatic breathing to perform pelvic drop for relaxation of the pelvic floor with therapist finger in the vaginal canal to feel the muscles Strengthening: Educated patient on walking program for 15 minutes per day to work on her endurance and strength to return to work Self-care: Educated patient on vaginal wand, how it is used on the pelvic floor using the pelvic model, and where she can purchase one to reduce trigger  points in the pelvic floor    10/11/23 Manual: Soft tissue mobilization: Manual work to bilateral hip adductors to improve tissue mobility Manual work to the urogenital diaphragm, along the perineal body, levator ani, and ischiocavernosus to lengthen the tissue Internal pelvic floor techniques: No emotional/communication barriers or cognitive limitation. Patient is motivated to learn. Patient understands and agrees with treatment goals and plan. PT explains patient will be examined in standing, sitting, and lying down to see how their muscles and joints work. When they are ready, they will be asked to remove their underwear so PT can examine their perineum. The patient is also given the option of providing their own chaperone as one is not provided in our facility. The patient also has the right and is explained the right to defer or refuse any part of the evaluation or treatment including the internal exam. With the patient's consent, PT will use one gloved finger to gently assess the muscles of the pelvic floor, seeing how well it contracts and relaxes and if there is muscle symmetry. After, the patient will get dressed and PT and patient will discuss exam findings and plan of care. PT and patient discuss plan of care, schedule, attendance policy and HEP activities.  Therapist using her index finger going through the vaginal canal working on the levator ani and puborectalis with hip movements to lengthen the tissue and monitor for pain.  Neuromuscular re-education: Down training: Diaphragmatic breathing to work on bulging the pelvic floor  Therapist finger in the vaginal canal working on relaxing the pelvic floor after contraction and to be able to feel what relaxation feels like Exercises: Stretches/mobility: Single knee to chest holding 30 sec bil.  1/2 happy baby stretch holding 30  sec bil.  Pull knee across the trunk holding 30 sec bil.  Therapeutic activities: Functional strengthening  activities: Educated patient on squatting with core engagement and breathing out with lifting to reduce strain on pelvic floor.       PATIENT EDUCATION:  10/25/23 Education details: Access Code: [email or web address], information on dry needling, information on vaginal wand, educated patient on walking 15 min per day Person educated: Patient Education method: Explanation, Demonstration, Tactile cues, Verbal cues, and Handouts Education comprehension: verbalized understanding, returned demonstration, verbal cues required, tactile cues required, and needs further education  HOME EXERCISE PROGRAM: 10/25/23 Access Code: [email or web address] URL: [email or web address] Date: 10/25/2023 Prepared by: [name]  Exercises - Seated Piriformis Stretch with Trunk Bend  - 1 x daily - 7 x weekly - 1 sets - 1 reps - 30 sec hold - Seated Hamstring Stretch  - 1 x daily - 7 x weekly - 1 sets - 1 reps - 30 sec hold - Seated Happy Baby With Trunk Flexion For Pelvic Relaxation  - 1 x daily - 7 x weekly - 1 sets - 1 reps - 30 sec hold - Cat Cow  - 1 x daily - 7 x weekly - 1 sets - 10 reps - Diaphragmatic Breathing in Child's Pose with Pelvic Floor Relaxation  - 1 x daily - 7 x weekly - 1 sets - 5 reps - 10 sec hold - Supine Diaphragmatic Breathing  - 1 x daily - 7 x weekly - 1 sets - 10 reps - Sidelying Thoracic Rotation with Open Book  - 1 x daily - 2 x weekly - 1 sets - 10 reps   ASSESSMENT:  CLINICAL IMPRESSION: Patient is a 43 y.o. female who was seen today for physical therapy  treatment for pelvic floor dysfunction. Patient has palpable tenderness located in the lower abdomen.  Laying on stomach causes 4/10 and feels pressure in the vagina. Patient is not having intercourse due to fear of pain. Patient has more tightness on the left gluteal than right. She has increased pain when in childs pose due to the pressure on the abdomen.  Patient is learning ways to manage her prolapse. Patient will benefit from skilled  therapy to reduce her pain to restore her function.    OBJECTIVE IMPAIRMENTS: decreased activity tolerance, decreased strength, increased fascial restrictions, increased muscle spasms, and pain.   ACTIVITY LIMITATIONS: lifting and standing  PARTICIPATION LIMITATIONS: cleaning, laundry, shopping, community activity, and occupation  PERSONAL FACTORS: Fitness are also affecting patient's functional outcome.   REHAB POTENTIAL: Excellent  CLINICAL DECISION MAKING: Stable/uncomplicated  EVALUATION COMPLEXITY: Low   GOALS: Goals reviewed with patient? Yes  SHORT TERM GOALS: Target date: 09/26/23  Patient independent with initial HEP for flexibility and diaphragmatic  breathing.  Baseline: Goal status: Met 10/04/23  2.  Patient educated on correct lifting technique to reduce strain on the pelvic floor.  Baseline:  Goal status: INITIAL  3.  Patient educated on perineal massage to reduce trigger points.  Baseline: educated patient on vaginal wand Goal status: ongoing   LONG TERM GOALS: Target date: 02/1024  Patient independent with advanced HEP for core and pelvic floor strength.  Baseline:  Goal status: INITIAL  2.  Patient able to lift 20# with minimal to no pain due to reduction of trigger points in the pelvic floor.  Baseline:  Goal status: INITIAL  3.  Patient reports minimal to no pain after penile penetration due to the ability to lengthen the pelvic floor.  Baseline:  Goal status: INITIAL  4.  Patient reports her lower abdominal pain decreased </= 0-1/10 with standing 1-2 hours at work due to improve strength of the core and hips.  Baseline:  Goal status: INITIAL   PLAN:  PT FREQUENCY: 1x/week  PT DURATION: 6 months  PLANNED INTERVENTIONS: 97110-Therapeutic exercises, 97530- Therapeutic activity, [id number]- Neuromuscular re-education, 97140- Manual therapy, 97014- Electrical stimulation (unattended), 97035- Ultrasound, Patient/Family education, Taping, Dry Needling,  Cryotherapy, Moist heat, and Biofeedback  PLAN FOR NEXT SESSION:   pelvic floor work,  core strength, see if she has gotten the wand, foam rolling of the hips  [name], PT 11/01/23 11:36 AM

## 2023-11-04 ENCOUNTER — Ambulatory Visit: Payer: 59 | Admitting: Professional

## 2023-11-04 ENCOUNTER — Encounter: Payer: Self-pay | Admitting: Professional

## 2023-11-04 DIAGNOSIS — F331 Major depressive disorder, recurrent, moderate: Secondary | ICD-10-CM

## 2023-11-04 DIAGNOSIS — F4322 Adjustment disorder with anxiety: Secondary | ICD-10-CM

## 2023-11-04 NOTE — Progress Notes (Addendum)
 [facility] Counselor/Therapist Progress Note  Patient ID: Connie Carlson, MRN: [id number],    Prefers to go by Connie Carlson, her middle name  Date: 11/04/2023  Time Spent:  48 minutes 906-954am   Treatment Type: Individual Therapy  Risk Assessment: Danger to Self:  No Self-injurious Behavior: No Danger to Others: No  Subjective: The patient arrived late for her inperson appointment.   Issues addressed: 1-mood -depressed and tearful -unmotivated -spends most time by herself -only gets dressed and make up on if going somewhere -pt still grieving loss of relationship with Connie Carlson despite knowing it was not good for her -admits that she at least "had" to do things 2-son Connie Carlson -has announced he plans to move to [facility] with his S.O. -pt trying to convince him to stay because she has never lived in a different city than him -son is huge support to pt and he will no longer be living with her 3-coping -creating and following a schedule -get out of home daily -engage with other people -get your own social group to prepare for Daniel's departure  Treatment Plan Problems Addressed  Career Success Obstacles, Depression, Low Self-Esteem/Lack of Assertiveness, Partner Relational Problems  Goals 1. Develop a sense of own personal power and self-esteem within the relationship. 2. Develop advocacy and empowerment skills that involve taking a proactive stance in the work environment. 3. Develop assertiveness skills and techniques for application in work-related activities (e.g., establishing boundaries with coworkers, Garment/textile technologist). 4. Develop healthy cognitive mechanisms to facilitate positive attitudes and beliefs about self within the context of one's environment to mitigate depressive symptoms. Objective Articulate signs and symptoms of depression in current life experiences. Target Date: 2024-11-03 Frequency: Biweekly  Progress: 70 Modality: individual  Related  Interventions Encourage the client to share her feelings of depression to gain an insight into precipitating events and implications of symptoms; normalize her feelings of depression. Objective Implement behavioral interventions to overcome depression. Target Date: 2024-11-03 Frequency: Biweekly  Progress: 30 Modality: individual  Related Interventions Assign the client to participate as fully as possible in a healthy exercise regimen. Encourage the client to continue participation in positive social support systems. Objective Identify and replace cognitive self-talk that supports depression. Target Date: [id number] Frequency: Biweekly  Progress: 30 Modality: individual  Related Interventions Reinforce the client's positive, reality-based cognitive messages that enhance self-confidence and increase adaptive action (see "Positive Self-Talk" in the Adult Psychotherapy Homework Planner, 2nd ed. by Stephannie Li). Do "behavioral experiments" in which depressive automatic thoughts are treated as hypotheses/predictions, reality-based alternative hypotheses/ predictions are generated, and both are tested against the client's past, present, and/or future experiences. Encourage the client to discuss cognitive distortions, including automatic thoughts (e.g., negative view of self, future, experience) and negative schemas (e.g., core beliefs about self and others based on earlier childhood experiences); assess frequency of negative self-statements associated with depression. Objective Increase the frequency of engaging in pleasant activities. Target Date: 2024-11-03 Frequency: Biweekly  Progress: 30 Modality: individual  Related Interventions Assign the client reading materials regarding overcoming depression for women (e.g., Women and Depression: A Practical Self-Help Guide by Allyne Gee; Feeling Good by Lawerance Bach; Women & Depression by Collier Bullock; Silencing the Self: Women and Depression by  Ree Kida). Objective Identify precipitating events and factors of depression, particularly roles of self and others in depression. 2024-11-03 Frequency: Biweekly   Modality: individual  Related Interventions Explore the degree to which primary symptoms of depression are a result of gender role socialization (e.g., passivity, decreased self-esteem, learned helplessness, interpersonal orientation towards  pleasing others). Evaluate symptoms to consider the presence of culture-bound syndromes and/or culture specificity of symptoms (e.g., ataques de nervios, amok, postemigration effects). Objective Increase the level of physical exercise. Target Date: 2024-11-03 Frequency: Biweekly  Progress: 10 Modality: individual  Objective Increase social contacts and communicate needs within existing interpersonal relationships. Target Date: 2024-11-03 Frequency: Biweekly  Progress: 30 Modality: individual  5. Explore career options that have been automatically ruled out due to low self-concept or limited opportunities. Objective Identify and replace at least three negative self-talk statements that perpetuate feelings of powerlessness or hopelessness. Target Date: 2024-11-03 Frequency: Biweekly  Progress: 10 Modality: individual  Objective Identify and replace cognitive self-talk that contributes to all-or-nothing thinking, gender-role stereotypes, or low career self-efficacy. Target Date: 2024-11-03 Frequency: Biweekly  Progress: 10 Modality: individual  Objective Develop career-related strategies to overcome and persist in the face of obstacles. Target Date: 2024-11-03 Frequency: Biweekly  Progress: 30 Modality: individual  Objective Increase self-esteem and self-concept by exploring and encompassing other roles/identities besides those of career. Target Date: 2024-11-03 Frequency: Biweekly  Progress: 20 Modality: individual  Objective Identify barriers and personal benefits to balancing career and  family life. Target Date: 2024-11-03 Frequency: Biweekly  Progress: 60 Modality: individual  6. Identify and increase intrapersonal, interpersonal, and physical resources to foster positive coping strategies. 7. Improve depressed mood to maximize effective social, occupational, and physical functioning. 8. Improve self-esteem and develop a positive self-image as capable and competent. 9. Increase ability to express needs and desires openly and honestly. 10. Increase assertiveness skills and ability to advocate for self. 11. Increase awareness of own role in relationship conflicts. Objective Identify a pattern of repeatedly forming destructive intimate relationships. Target Date: 2024-11-03 Frequency: Biweekly  Progress: 60 Modality: individual  12. Increase involvement in activities that foster confidence and a sense of accomplishment. Objective Acknowledge self-disparaging statements and recognize the tendency to engage in such statements. Target Date: 2024-11-03 Frequency: Biweekly  Progress: 60 Modality: individual  Related Interventions Ask the client to complete and process an exercise in the book Ten Days to Self-Esteem! Lawerance Bach). Objective Verbalize an understanding of the differences between passive, assertive, and aggressive behavior. Target Date: 2024-11-03 Frequency: Biweekly  Progress: 50 Modality: individual  Objective Describe personal history of self-devaluation and lack of assertiveness. Target Date: 2024-11-03 Frequency: Biweekly  Progress: 60 Modality: individual  Related Interventions Use cognitive-restructuring techniques to change the client's belief system (i.e., via reality-testing and rational thought) toward a more positive, realistic self-perception. Assist the client in becoming aware of how she indirectly expresses negative feelings about self (e.g., lack of eye contact, social withdrawal, sweating, expectations of failure or rejection). Assist the client in  identifying negative beliefs about herself. Discuss context of self-disparagement, including client's perceptions of self from childhood to present, role of caregivers and peers, and related experiences. Objective Identify three roadblocks to improved self-esteem. Target Date: 2024-11-03 Frequency: Biweekly  Progress: 0 Modality: individual  Objective   Diagnosis:Major depressive disorder, recurrent episode, moderate (HCC)  Adjustment disorder with anxiety  Plan:  -meet again on Friday, November 04, 2023 at 1pm in person

## 2023-11-08 ENCOUNTER — Encounter: Payer: Self-pay | Admitting: Physical Therapy

## 2023-11-11 ENCOUNTER — Ambulatory Visit: Payer: 59 | Admitting: Professional

## 2023-11-15 ENCOUNTER — Encounter: Payer: 59 | Admitting: Physical Therapy

## 2023-11-15 ENCOUNTER — Encounter: Payer: Self-pay | Admitting: Physical Therapy

## 2023-11-15 DIAGNOSIS — R252 Cramp and spasm: Secondary | ICD-10-CM | POA: Diagnosis not present

## 2023-11-15 DIAGNOSIS — R1030 Lower abdominal pain, unspecified: Secondary | ICD-10-CM

## 2023-11-15 DIAGNOSIS — M6281 Muscle weakness (generalized): Secondary | ICD-10-CM

## 2023-11-15 DIAGNOSIS — R102 Pelvic and perineal pain: Secondary | ICD-10-CM

## 2023-11-15 NOTE — Therapy (Signed)
 OUTPATIENT PHYSICAL THERAPY FEMALE PELVIC TREATMENT   Patient Name: Connie Carlson MRN: [id number] DOB:1981/05/05, 42 y.o., female Today's Date: 11/15/2023  END OF SESSION:  PT End of Session - 11/15/23 0835     Visit Number 7    Date for PT Re-Evaluation 02/28/24    Authorization Type [employer]    PT Start Time 0830    PT Stop Time 0915    PT Time Calculation (min) 45 min    Activity Tolerance Patient tolerated treatment well    Behavior During Therapy [facility] for tasks assessed/performed             Past Medical History:  Diagnosis Date   High blood pressure    Past Surgical History:  Procedure Laterality Date   CHOLECYSTECTOMY     mole remove     under right breast by [facility] - skin cancer   Patient Active Problem List   Diagnosis Date Noted   Other insomnia 03/30/2023   Generalized obesity 03/30/2023   BMI 37.0-37.9, adult 03/30/2023   Elevated glucose 03/30/2023   Vitamin D deficiency 03/30/2023   Health care maintenance 03/30/2023   SOB (shortness of breath) on exertion 03/30/2023   Other fatigue 03/30/2023   Major depressive disorder, recurrent episode, mild (HCC) 08/26/2022   Stress and adjustment reaction 06/29/2022   Anal fissure 01/13/2022   Depression, recurrent (HCC) 04/07/2021   Hypertension 06/11/2020   Severe obesity (BMI 35.0-39.9) with comorbidity (HCC) 06/11/2020   IFG (impaired fasting glucose) 10/08/2013   Migraine headache without aura 02/16/2013   WEIGHT GAIN 01/01/2009   Allergic rhinitis 06/28/2006   GASTROESOPHAGEAL REFLUX, NO ESOPHAGITIS 06/28/2006    PCP: Agapito Games, MD  REFERRING PROVIDER: Lennart Pall, MD   REFERRING DIAG: [phone] (ICD-10-CM) - Pelvic floor dysfunction   THERAPY DIAG:  Cramp and spasm  Pelvic pain  Lower abdominal pain  Muscle weakness (generalized)  Rationale for Evaluation and Treatment: Rehabilitation  ONSET DATE: 1/23  SUBJECTIVE:                                                                                                                                                                                            SUBJECTIVE STATEMENT: Did not get the wand for the pelvic floor yet. I have been doing some of the exercises at home. I will start walking more this week since weather is better. Laying down with legs up will lower the pain and bring pain from 9 to 6/10. I have been leaking urine with the coughing. I have not had intercourse due to the fear of pain.   PAIN:  Are  you having pain? Yes NPRS scale: 8/10; 11/01/23 Pain location:  lower abdominal area  Pain type: pressure, like something is going to come out of the vagina Pain description: intermittent   Aggravating factors: lifting heavy items, standing several hours, carrying and bending over Relieving factors: Advil  PRECAUTIONS: None  RED FLAGS: None   WEIGHT BEARING RESTRICTIONS: No  FALLS:  Has patient fallen in last 6 months? No  LIVING ENVIRONMENT: Lives with: lives with their family  OCCUPATION: works at [employer] with a lot of lifting.   PLOF: Independent  PATIENT GOALS: reduce pain  PERTINENT HISTORY:  Cholecystectomy Sexual abuse: No  BOWEL MOVEMENT: no issues  URINATION: Pain with urination: No Fully empty bladder: Yes:   Stream: Strong Urgency: No Frequency: average Leakage: Coughing, sneeze, laughing Pads: No  INTERCOURSE: Pain with intercourse: After Intercourse, 10/10 Ability to have vaginal penetration:  Yes:   Climax: not when having pain Marinoff Scale: 2/3  PREGNANCY: Vaginal deliveries 1 Tearing No  OBJECTIVE:  Note: Objective measures were completed at Evaluation unless otherwise noted.  DIAGNOSTIC FINDINGS:  Ultrasound negative; biopsy is negative   COGNITION: Overall cognitive status: Within functional limits for tasks assessed     SENSATION: Light touch: Appears intact Proprioception: Appears intact   POSTURE: No Significant postural  limitations  PELVIC ALIGNMENT:  LUMBARAROM/PROM: full lumbar ROM   LOWER EXTREMITY ROM: full bilateral hip ROM   LOWER EXTREMITY MMT: Bilateral hip strength is 4/5.    PALPATION:   General  tenderness located in the lower abdomen, decreased movement of the lower rib cage, good mobility of the diaphragm, tenderness in the buttocks                External Perineal Exam tenderness located in the bulbocavernosus, ischiocavernosus, perineal body                             Internal Pelvic Floor levator ani, obturator internist, side of the introitus, sides of the bladder  Patient confirms identification and approves PT to assess internal pelvic floor and treatment Yes  PELVIC MMT:   MMT eval 10/11/23  Vaginal 3/5 holding for 6 seconds 3/5  (Blank rows = not tested)        TONE: increased  PROLAPSE: none  TODAY'S TREATMENT:    11/15/23 Manual: Soft tissue mobilization: Manual work to bilateral hip adductors, quadriceps, and ITB to reduce trigger points Myofascial release: Around the urogenital diaphragm, along the ischiocavernosus, bulbocavernosus and obturator internist to reduce trigger points Exercises: Stretches/mobility: Piriformis stretch in sitting holding for 30 sec bil.  Lunge hip adductor stretch moving foot in and out bil.  Deep squat to stretch the pelvic floor Standing bilateral hamstring stretch with forward fold stretch holding 30 sec.  Self-care: Discussed with patient on seeing a therapist to assist with her fear of penile penetration vaginally. This fear prevents her from being intimate with her husband. She sees a therapist presently and will discuss with her.       11/01/23 Manual: Soft tissue mobilization: Manual work to the diaphragm  Manual work to the gluteals to lengthen the muscles Manual work to the Tax inspector on her side  working on the trigger points Myofascial release: Fascial release along the lower  abdomen, mesenteric root, urachus ligament to release the restrictions and reduce pain Neuromuscular re-education: Form correction: Quadruped with knee on yoga block to stretch out the gluteals and SI  joint for mobility by rocking side to side and forward and back Down training: Diaphragmatic breathing to elongate the pelvic floor in supine Childs pose with breathing into back to open up the back rib cage Exercises: Strengthening: Resistive hip flexion increases pain of the lower abdomen Therapeutic activities: Functional strengthening activities: Laying on her back with legs elevated to reduce the pressure in the lower abdomen and massage upward in the lower abdomen. Patient understands to do this 2 times per day for 15 minutes Patient squat 8 times with verbal cues to not flex at waist, breath as she stands up and use her legs to reduce pressure on the pelvic floor.     10/25/23 Manual: Internal pelvic floor techniques: No emotional/communication barriers or cognitive limitation. Patient is motivated to learn. Patient understands and agrees with treatment goals and plan. PT explains patient will be examined in standing, sitting, and lying down to see how their muscles and joints work. When they are ready, they will be asked to remove their underwear so PT can examine their perineum. The patient is also given the option of providing their own chaperone as one is not provided in our facility. The patient also has the right and is explained the right to defer or refuse any part of the evaluation or treatment including the internal exam. With the patient's consent, PT will use one gloved finger to gently assess the muscles of the pelvic floor, seeing how well it contracts and relaxes and if there is muscle symmetry. After, the patient will get dressed and PT and patient will discuss exam findings and plan of care. PT and patient discuss plan of care, schedule, attendance policy and HEP activities.   Placing therapist finger into the vaginal canal working on the levator ani and obturator internist with hip movements Manual work on the sides of the bladder and urethra Exercises: Stretches/mobility: Sidely open book 10 x each side with tactile cues to increase stretch and then cues to contract abdominals when going back to start position Pull knee across the trunk holding 30 sec bil. Single knee to chest holding 30 sec bil.  Prone press ups 5 times Diaphragmatic breathing to perform pelvic drop for relaxation of the pelvic floor with therapist finger in the vaginal canal to feel the muscles Strengthening: Educated patient on walking program for 15 minutes per day to work on her endurance and strength to return to work Self-care: Educated patient on vaginal wand, how it is used on the pelvic floor using the pelvic model, and where she can purchase one to reduce trigger points in the pelvic floor       PATIENT EDUCATION:  11/15/23 Education details: Access Code: [email or web address],sent patient you tube video to perform perineal massage Person educated: Patient Education method: Explanation, Demonstration, Tactile cues, Verbal cues, and Handouts Education comprehension: verbalized understanding, returned demonstration, verbal cues required, tactile cues required, and needs further education  HOME EXERCISE PROGRAM: 10/25/23 Access Code: [email or web address] URL: [email or web address] Date: 10/25/2023 Prepared by: [name]  Exercises - Seated Piriformis Stretch with Trunk Bend  - 1 x daily - 7 x weekly - 1 sets - 1 reps - 30 sec hold - Seated Hamstring Stretch  - 1 x daily - 7 x weekly - 1 sets - 1 reps - 30 sec hold - Seated Happy Baby With Trunk Flexion For Pelvic Relaxation  - 1 x daily - 7 x weekly - 1 sets - 1 reps - 30 sec hold - Cat Cow  -  1 x daily - 7 x weekly - 1 sets - 10 reps - Diaphragmatic Breathing in Child's Pose with Pelvic Floor Relaxation  - 1 x daily - 7 x weekly - 1  sets - 5 reps - 10 sec hold - Supine Diaphragmatic Breathing  - 1 x daily - 7 x weekly - 1 sets - 10 reps - Sidelying Thoracic Rotation with Open Book  - 1 x daily - 2 x weekly - 1 sets - 10 reps   ASSESSMENT:  CLINICAL IMPRESSION: Patient is a 43 y.o. female who was seen today for physical therapy  treatment for pelvic floor dysfunction. Patient has tighter muscles on the right than the left with the perineum and right thigh. She is nervous for therapist to work on the pelvic floor muscles so she does better working on the thighs first then the outside of the perineum. She is now doing a walking program. She ordered the vaginal wand and has not come in yet.   Patient is learning ways to manage her prolapse. Patient will benefit from skilled therapy to reduce her pain to restore her function.    OBJECTIVE IMPAIRMENTS: decreased activity tolerance, decreased strength, increased fascial restrictions, increased muscle spasms, and pain.   ACTIVITY LIMITATIONS: lifting and standing  PARTICIPATION LIMITATIONS: cleaning, laundry, shopping, community activity, and occupation  PERSONAL FACTORS: Fitness are also affecting patient's functional outcome.   REHAB POTENTIAL: Excellent  CLINICAL DECISION MAKING: Stable/uncomplicated  EVALUATION COMPLEXITY: Low   GOALS: Goals reviewed with patient? Yes  SHORT TERM GOALS: Target date: 09/26/23  Patient independent with initial HEP for flexibility and diaphragmatic  breathing.  Baseline: Goal status: Met 10/04/23  2.  Patient educated on correct lifting technique to reduce strain on the pelvic floor.  Baseline:  Goal status: met 11/15/23  3.  Patient educated on perineal massage to reduce trigger points.  Baseline: educated patient on vaginal wand Goal status: Met 11/15/23   LONG TERM GOALS: Target date: 02/1024  Patient independent with advanced HEP for core and pelvic floor strength.  Baseline:  Goal status: INITIAL  2.  Patient able to  lift 20# with minimal to no pain due to reduction of trigger points in the pelvic floor.  Baseline:  Goal status: INITIAL  3.  Patient reports minimal to no pain after penile penetration due to the ability to lengthen the pelvic floor.  Baseline:  Goal status: INITIAL  4.  Patient reports her lower abdominal pain decreased </= 0-1/10 with standing 1-2 hours at work due to improve strength of the core and hips.  Baseline:  Goal status: INITIAL   PLAN:  PT FREQUENCY: 1x/week  PT DURATION: 6 months  PLANNED INTERVENTIONS: 97110-Therapeutic exercises, 97530- Therapeutic activity, [id number]- Neuromuscular re-education, 97140- Manual therapy, 97014- Electrical stimulation (unattended), 97035- Ultrasound, Patient/Family education, Taping, Dry Needling, Cryotherapy, Moist heat, and Biofeedback  PLAN FOR NEXT SESSION:   pelvic floor work,  core strength, see if she has gotten the wand, lifting  [name], PT 11/15/23 9:29 AM

## 2023-11-20 ENCOUNTER — Ambulatory Visit (HOSPITAL_BASED_OUTPATIENT_CLINIC_OR_DEPARTMENT_OTHER): Payer: 59

## 2023-11-21 ENCOUNTER — Telehealth: Payer: Self-pay | Admitting: Nurse Practitioner

## 2023-11-21 ENCOUNTER — Encounter: Payer: Self-pay | Admitting: Nurse Practitioner

## 2023-11-21 ENCOUNTER — Other Ambulatory Visit: Payer: Self-pay | Admitting: Nurse Practitioner

## 2023-11-21 ENCOUNTER — Ambulatory Visit: Admitting: Nurse Practitioner

## 2023-11-21 ENCOUNTER — Telehealth: Payer: Self-pay

## 2023-11-21 VITALS — BP 134/81 | HR 84 | Temp 98.1°F | Ht 61.0 in | Wt 230.0 lb

## 2023-11-21 DIAGNOSIS — I10 Essential (primary) hypertension: Secondary | ICD-10-CM

## 2023-11-21 DIAGNOSIS — Z6841 Body Mass Index (BMI) 40.0 and over, adult: Secondary | ICD-10-CM

## 2023-11-21 DIAGNOSIS — E66813 Obesity, class 3: Secondary | ICD-10-CM

## 2023-11-21 MED ORDER — TIRZEPATIDE-WEIGHT MANAGEMENT 2.5 MG/0.5ML ~~LOC~~ SOLN: 2.5000 mg | SUBCUTANEOUS | 0 refills | Status: DC

## 2023-11-21 NOTE — Progress Notes (Addendum)
 Office: [phone]  /  Fax: [phone]  WEIGHT SUMMARY AND BIOMETRICS  Weight Lost Since Last Visit: 0lb  Weight Gained Since Last Visit: 20lb   Vitals Temp: 98.1 F (36.7 C) BP: 134/81 Pulse Rate: 84 SpO2: 96 %   Anthropometric Measurements Height: 5\' 1"  (1.549 m) Weight: 230 lb (104.3 kg) BMI (Calculated): 43.48 Weight at Last Visit: 210lb Weight Lost Since Last Visit: 0lb Weight Gained Since Last Visit: 20lb Starting Weight: 198lb Total Weight Loss (lbs): 0 lb (0 kg)   Body Composition  Body Fat %: 48 % Fat Mass (lbs): 110.6 lbs Muscle Mass (lbs): 114 lbs Total Body Water (lbs): 79.4 lbs Visceral Fat Rating : 14   Other Clinical Data Fasting: No Labs: No Today's Visit #: 5 Starting Date: 03/30/23     HPI  Chief Complaint: OBESITY  Connie Carlson is here to discuss her progress with her obesity treatment plan. She is on the the Category 2 Plan and states she is following her eating plan approximately 0 % of the time. She states she is exercising 0 minutes 0 days per week.   Interval History:  Since last office visit she has gained 20 lbs.  She is under stress at home and is stress eating.  She has been out of work and is going through a separation.  She started Cymbalta in Jan.  Stopped Topamax in December due to side effects of insomnia.  She is seeing a therapist every 2 weeks.    Pharmacotherapy for weight loss: She is not currently taking medications  for medical weight loss.    I had prescribed her Contrave in the past and didn't start due to cost.    Previous pharmacotherapy for medical weight loss:   Phentermine-stopped due to side effects-felt terrible while taking it Topamax-stopped due to side effects of insomnia  Avoid Qsymia due to side effects to Phentermine and Topamax.    Bariatric surgery:  Patient has not had bariatric surgery.   Hypertension  Medication(s): Diovan HCT 160-12.5mg . Denies side effects.  Denies chest pain,  palpitations and SOB.  BP Readings from Last 3 Encounters:  11/21/23 134/81  10/12/23 (!) 149/92  08/30/23 130/78   Lab Results  Component Value Date   CREATININE 0.87 03/30/2023   CREATININE 0.78 02/15/2023   CREATININE 0.87 08/25/2022      PHYSICAL EXAM:  Blood pressure 134/81, pulse 84, temperature 98.1 F (36.7 C), height 5\' 1"  (1.549 m), weight 230 lb (104.3 kg), last menstrual period 11/05/2023, SpO2 96%. Body mass index is 43.46 kg/m.  General: She is overweight, cooperative, alert, well developed, and in no acute distress. PSYCH: Has normal mood, affect and thought process.   Extremities: No edema.  Neurologic: No gross sensory or motor deficits. No tremors or fasciculations noted.    DIAGNOSTIC DATA REVIEWED:  BMET    Component Value Date/Time   NA 139 03/30/2023 0902   K 4.7 03/30/2023 0902   CL 104 03/30/2023 0902   CO2 22 03/30/2023 0902   GLUCOSE 88 03/30/2023 0902   GLUCOSE 96 02/15/2023 0826   BUN 14 03/30/2023 0902   CREATININE 0.87 03/30/2023 0902   CREATININE 0.78 02/15/2023 0826   CALCIUM 9.5 03/30/2023 0902   GFRNONAA 103 10/17/2020 0000   GFRAA 119 10/17/2020 0000   Lab Results  Component Value Date   HGBA1C 5.5 03/30/2023   HGBA1C 5.8 (H) 10/08/2013   Lab Results  Component Value Date   INSULIN 12.4 03/30/2023   Lab Results  Component Value Date   TSH 1.270 03/30/2023   CBC    Component Value Date/Time   WBC 7.7 08/25/2022 1411   RBC 4.07 08/25/2022 1411   HGB 12.2 08/25/2022 1411   HCT 37.1 08/25/2022 1411   PLT 290 08/25/2022 1411   MCV 91.2 08/25/2022 1411   MCH 30.0 08/25/2022 1411   MCHC 32.9 08/25/2022 1411   RDW 12.8 08/25/2022 1411   Iron Studies    Component Value Date/Time   IRON 42 05/08/2015 1632   TIBC 480 (H) 05/08/2015 1632   IRONPCTSAT 9 (L) 05/08/2015 1632   Lipid Panel     Component Value Date/Time   CHOL 150 03/30/2023 0902   TRIG 93 03/30/2023 0902   HDL 53 03/30/2023 0902   CHOLHDL 2.8  02/02/2022 0000   VLDL 19 05/06/2015 0954   LDLCALC 80 03/30/2023 0902   LDLCALC 74 02/02/2022 0000   Hepatic Function Panel     Component Value Date/Time   PROT 6.9 03/30/2023 0902   ALBUMIN 4.1 03/30/2023 0902   AST 17 03/30/2023 0902   ALT 16 03/30/2023 0902   ALKPHOS 50 03/30/2023 0902   BILITOT 0.2 03/30/2023 0902      Component Value Date/Time   TSH 1.270 03/30/2023 0902   Nutritional Lab Results  Component Value Date   VD25OH 41.9 03/30/2023   VD25OH 26 (L) 01/12/2012     ASSESSMENT AND PLAN  TREATMENT PLAN FOR OBESITY:  Recommended Dietary Goals  Denali is currently in the action stage of change. As such, her goal is to continue weight management plan. She has agreed to keeping a food journal and adhering to recommended goals of 1200-1300 calories and 80+ protein.  Behavioral Intervention  We discussed the following Behavioral Modification Strategies today: increasing lean protein intake to established goals, decreasing simple carbohydrates , increasing vegetables, increasing water intake , work on meal planning and preparation, work on tracking and journaling calories using tracking application, reading food labels , keeping healthy foods at home, continue to work on implementation of reduced calorie nutritional plan, and continue to work on maintaining a reduced calorie state, getting the recommended amount of protein, incorporating whole foods, making healthy choices, staying well hydrated and practicing mindfulness when eating..  Additional resources provided today: NA  Recommended Physical Activity Goals  Arfa has been advised to work up to 150 minutes of moderate intensity aerobic activity a week and strengthening exercises 2-3 times per week for cardiovascular health, weight loss maintenance and preservation of muscle mass.   She has agreed to Think about enjoyable ways to increase daily physical activity and overcoming barriers to exercise, Increase physical  activity in their day and reduce sedentary time (increase NEAT)., and Work on scheduling and tracking physical activity.    Pharmacotherapy We discussed various medication options to help Gretel with her weight loss efforts and we both agreed to start Zepbound 2.5mg .  Side effects discussed. Patient is on BCP.    Contraindications:  Pancreatitis (active gallstones) Medullary thyroid cancer High triglycerides (>500)-will need labs prior to starting Multiple Endocrine Neoplasia syndrome type 2 (MEN 2) Trying to get pregnant Breastfeeding Use with caution with taking insulin or sulfonylureas (will need to monitor blood sugars for hypoglycemia)  Avoid Qsymia due to side effects to Phentermine and Topamax.  ASSOCIATED CONDITIONS ADDRESSED TODAY  Action/Plan  Primary hypertension Continue to follow up with PCP. Continue med as directed  Class 3 severe obesity due to excess calories with serious comorbidity and body mass  index (BMI) of 40.0 to 44.9 in adult [facility]) -     Tirzepatide-Weight Management; Inject 2.5 mg into the skin once a week.  Dispense: 3 mL; Refill: 0         Return in about 3 weeks (around 12/12/2023).Marland Kitchen She was informed of the importance of frequent follow up visits to maximize her success with intensive lifestyle modifications for her multiple health conditions.   ATTESTASTION STATEMENTS:  Reviewed by clinician on day of visit: allergies, medications, problem list, medical history, surgical history, family history, social history, and previous encounter notes.     Theodis Sato. Philo Kurtz FNP-C

## 2023-11-21 NOTE — Telephone Encounter (Signed)
 Patient called, she spoke with her insurance company and they do not cover Zepbound. She said that she does want to get it and she will pay out of pocket.

## 2023-11-21 NOTE — Patient Instructions (Signed)

## 2023-11-22 ENCOUNTER — Encounter: Payer: Self-pay | Admitting: Physical Therapy

## 2023-11-22 ENCOUNTER — Encounter: Payer: Self-pay | Attending: Obstetrics and Gynecology | Admitting: Physical Therapy

## 2023-11-22 DIAGNOSIS — M6289 Other specified disorders of muscle: Secondary | ICD-10-CM | POA: Insufficient documentation

## 2023-11-22 DIAGNOSIS — M6281 Muscle weakness (generalized): Secondary | ICD-10-CM | POA: Diagnosis not present

## 2023-11-22 DIAGNOSIS — R1030 Lower abdominal pain, unspecified: Secondary | ICD-10-CM | POA: Diagnosis present

## 2023-11-22 DIAGNOSIS — R102 Pelvic and perineal pain: Secondary | ICD-10-CM | POA: Insufficient documentation

## 2023-11-22 DIAGNOSIS — R252 Cramp and spasm: Secondary | ICD-10-CM

## 2023-11-22 NOTE — Therapy (Signed)
 OUTPATIENT PHYSICAL THERAPY FEMALE PELVIC TREATMENT   Patient Name: Connie Carlson MRN: [id number] DOB:[birth year], 43 y.o., female Today's Date: 11/22/2023  END OF SESSION:  PT End of Session - 11/22/23 0837     Visit Number 8    Date for PT Re-Evaluation 02/28/24    Authorization Type [employer]    PT Start Time 0830    PT Stop Time 0915    PT Time Calculation (min) 45 min    Activity Tolerance Patient tolerated treatment well    Behavior During Therapy [facility] for tasks assessed/performed             Past Medical History:  Diagnosis Date   High blood pressure    Past Surgical History:  Procedure Laterality Date   CHOLECYSTECTOMY     mole remove     under right breast by [facility] - skin cancer   Patient Active Problem List   Diagnosis Date Noted   Other insomnia 03/30/2023   Generalized obesity 03/30/2023   BMI 37.0-37.9, adult 03/30/2023   Elevated glucose 03/30/2023   Vitamin D deficiency 03/30/2023   Health care maintenance 03/30/2023   SOB (shortness of breath) on exertion 03/30/2023   Other fatigue 03/30/2023   Major depressive disorder, recurrent episode, mild (HCC) 08/26/2022   Stress and adjustment reaction 06/29/2022   Anal fissure 01/13/2022   Depression, recurrent (HCC) 04/07/2021   Hypertension 06/11/2020   Severe obesity (BMI 35.0-39.9) with comorbidity (HCC) 06/11/2020   IFG (impaired fasting glucose) 10/08/2013   Migraine headache without aura 02/16/2013   WEIGHT GAIN 01/01/2009   Allergic rhinitis 06/28/2006   GASTROESOPHAGEAL REFLUX, NO ESOPHAGITIS 06/28/2006    PCP: Agapito Games, MD  REFERRING PROVIDER: Lennart Pall, MD   REFERRING DIAG: M62.89 (ICD-10-CM) - Pelvic floor dysfunction   THERAPY DIAG:  Cramp and spasm  Pelvic pain  Lower abdominal pain  Muscle weakness (generalized)  Rationale for Evaluation and Treatment: Rehabilitation  ONSET DATE: 1/23  SUBJECTIVE:                                                                                                                                                                                            SUBJECTIVE STATEMENT: I was laying on my stomach and had increased pain in the lower abdomen. I have been walking for 25 minutes.    PAIN:  Are you having pain? Yes NPRS scale: 7/10; 11/22/23 Pain location:  lower abdominal area  Pain type: pressure, like something is going to come out of the vagina Pain description: intermittent   Aggravating factors: lifting heavy items, standing several hours, carrying and bending  over Relieving factors: Advil  PRECAUTIONS: None  RED FLAGS: None   WEIGHT BEARING RESTRICTIONS: No  FALLS:  Has patient fallen in last 6 months? No  LIVING ENVIRONMENT: Lives with: lives with their family  OCCUPATION: works at [employer] with a lot of lifting.   PLOF: Independent  PATIENT GOALS: reduce pain  PERTINENT HISTORY:  Cholecystectomy Sexual abuse: No  BOWEL MOVEMENT: no issues  URINATION: Pain with urination: No Fully empty bladder: Yes:   Stream: Strong Urgency: No Frequency: average Leakage: Coughing, sneeze, laughing Pads: No  INTERCOURSE: Pain with intercourse: After Intercourse, 10/10 Ability to have vaginal penetration:  Yes:   Climax: not when having pain Marinoff Scale: 2/3  PREGNANCY: Vaginal deliveries 1 Tearing No  OBJECTIVE:  Note: Objective measures were completed at Evaluation unless otherwise noted.  DIAGNOSTIC FINDINGS:  Ultrasound negative; biopsy is negative   COGNITION: Overall cognitive status: Within functional limits for tasks assessed     SENSATION: Light touch: Appears intact Proprioception: Appears intact   POSTURE: No Significant postural limitations  PELVIC ALIGNMENT:  LUMBARAROM/PROM: full lumbar ROM   LOWER EXTREMITY ROM: full bilateral hip ROM   LOWER EXTREMITY MMT: Bilateral hip strength is 4/5.    PALPATION:   General  tenderness located  in the lower abdomen, decreased movement of the lower rib cage, good mobility of the diaphragm, tenderness in the buttocks                External Perineal Exam tenderness located in the bulbocavernosus, ischiocavernosus, perineal body                             Internal Pelvic Floor levator ani, obturator internist, side of the introitus, sides of the bladder  Patient confirms identification and approves PT to assess internal pelvic floor and treatment Yes  PELVIC MMT:   MMT eval 10/11/23  Vaginal 3/5 holding for 6 seconds 3/5  (Blank rows = not tested)        TONE: increased  PROLAPSE: none  TODAY'S TREATMENT:    11/22/23 Manual: Internal pelvic floor techniques: No emotional/communication barriers or cognitive limitation. Patient is motivated to learn. Patient understands and agrees with treatment goals and plan. PT explains patient will be examined in standing, sitting, and lying down to see how their muscles and joints work. When they are ready, they will be asked to remove their underwear so PT can examine their perineum. The patient is also given the option of providing their own chaperone as one is not provided in our facility. The patient also has the right and is explained the right to defer or refuse any part of the evaluation or treatment including the internal exam. With the patient's consent, PT will use one gloved finger to gently assess the muscles of the pelvic floor, seeing how well it contracts and relaxes and if there is muscle symmetry. After, the patient will get dressed and PT and patient will discuss exam findings and plan of care. PT and patient discuss plan of care, schedule, attendance policy and HEP activities.  Working on the round ligament on the right going through all of the restrictions.  Working on the right labia and ischiocavernosus and bulbocavernosus Release of the mons pubis Neuromuscular re-education: Core facilitation: Supine heel slides to engage  the lower abdominals and stretch the anterior hip Supine knee fall out to stretch the anterior hip and work on abdominals Self-care: Educated patient on how to  use the vaginal wand, using lubricant, how to work on the trigger points and release them, what position she should be in Educated patient on how to perform manual work to the lower abdominal and anterior hip area to manage her pain    11/15/23 Manual: Soft tissue mobilization: Manual work to bilateral hip adductors, quadriceps, and ITB to reduce trigger points Myofascial release: Around the urogenital diaphragm, along the ischiocavernosus, bulbocavernosus and obturator internist to reduce trigger points Exercises: Stretches/mobility: Piriformis stretch in sitting holding for 30 sec bil.  Lunge hip adductor stretch moving foot in and out bil.  Deep squat to stretch the pelvic floor Standing bilateral hamstring stretch with forward fold stretch holding 30 sec.  Self-care: Discussed with patient on seeing a therapist to assist with her fear of penile penetration vaginally. This fear prevents her from being intimate with her husband. She sees a therapist presently and will discuss with her.       11/01/23 Manual: Soft tissue mobilization: Manual work to the diaphragm  Manual work to the gluteals to lengthen the muscles Manual work to the Tax inspector on her side  working on the trigger points Myofascial release: Fascial release along the lower abdomen, mesenteric root, urachus ligament to release the restrictions and reduce pain Neuromuscular re-education: Form correction: Quadruped with knee on yoga block to stretch out the gluteals and SI joint for mobility by rocking side to side and forward and back Down training: Diaphragmatic breathing to elongate the pelvic floor in supine Childs pose with breathing into back to open up the back rib cage Exercises: Strengthening: Resistive hip flexion  increases pain of the lower abdomen Therapeutic activities: Functional strengthening activities: Laying on her back with legs elevated to reduce the pressure in the lower abdomen and massage upward in the lower abdomen. Patient understands to do this 2 times per day for 15 minutes Patient squat 8 times with verbal cues to not flex at waist, breath as she stands up and use her legs to reduce pressure on the pelvic floor.        PATIENT EDUCATION:  11/22/23 Education details: Access Code: [email or web address], educated patient on using the vaginal wand.  Person educated: Patient Education method: Explanation, Demonstration, Tactile cues, Verbal cues, and Handouts Education comprehension: verbalized understanding, returned demonstration, verbal cues required, tactile cues required, and needs further education  HOME EXERCISE PROGRAM: 11/22/23 Access Code: [email or web address] URL: [email or web address] Date: 11/22/2023 Prepared by: [name]  Exercises - Seated Piriformis Stretch with Trunk Bend  - 1 x daily - 7 x weekly - 1 sets - 1 reps - 30 sec hold - Seated Hamstring Stretch  - 1 x daily - 7 x weekly - 1 sets - 1 reps - 30 sec hold - Seated Happy Baby With Trunk Flexion For Pelvic Relaxation  - 1 x daily - 7 x weekly - 1 sets - 1 reps - 30 sec hold - Cat Cow  - 1 x daily - 7 x weekly - 1 sets - 10 reps - Diaphragmatic Breathing in Child's Pose with Pelvic Floor Relaxation  - 1 x daily - 7 x weekly - 1 sets - 5 reps - 10 sec hold - Supine Diaphragmatic Breathing  - 1 x daily - 7 x weekly - 1 sets - 10 reps - Sidelying Thoracic Rotation with Open Book  - 1 x daily - 2 x weekly - 1 sets - 10 reps - Diaphragmatic Breathing with Hips Elevated (for Pelvic  Organ Prolapse)  - 1 x daily - 7 x weekly - 3 sets - 10 reps - Bent Knee Fallouts  - 1 x daily - 3 x weekly - 2 sets - 10 reps - Supine Transversus Abdominis Bracing with Heel Slide  - 1 x daily - 3 x weekly - 2 sets - 10  reps    ASSESSMENT:  CLINICAL IMPRESSION: Patient is a 43 y.o. female who was seen today for physical therapy  treatment for pelvic floor dysfunction. Patient has learned how to use the vaginal wand to work on her pelvic floor muscles to reduce her pain. Her pain decreased from 7/10 to 4/10 after them treatment. She has increased pain in the round ligament, mons pubis and ischiocavernosus. Patient is walking regularly and doing her exercises.  Patient will benefit from skilled therapy to reduce her pain to restore her function.    OBJECTIVE IMPAIRMENTS: decreased activity tolerance, decreased strength, increased fascial restrictions, increased muscle spasms, and pain.   ACTIVITY LIMITATIONS: lifting and standing  PARTICIPATION LIMITATIONS: cleaning, laundry, shopping, community activity, and occupation  PERSONAL FACTORS: Fitness are also affecting patient's functional outcome.   REHAB POTENTIAL: Excellent  CLINICAL DECISION MAKING: Stable/uncomplicated  EVALUATION COMPLEXITY: Low   GOALS: Goals reviewed with patient? Yes  SHORT TERM GOALS: Target date: 09/26/23  Patient independent with initial HEP for flexibility and diaphragmatic  breathing.  Baseline: Goal status: Met 10/04/23  2.  Patient educated on correct lifting technique to reduce strain on the pelvic floor.  Baseline:  Goal status: met 11/15/23  3.  Patient educated on perineal massage to reduce trigger points.  Baseline: educated patient on vaginal wand Goal status: Met 11/15/23   LONG TERM GOALS: Target date: 02/1024  Patient independent with advanced HEP for core and pelvic floor strength.  Baseline:  Goal status: INITIAL  2.  Patient able to lift 20# with minimal to no pain due to reduction of trigger points in the pelvic floor.  Baseline:  Goal status: INITIAL  3.  Patient reports minimal to no pain after penile penetration due to the ability to lengthen the pelvic floor.  Baseline:  Goal status:  INITIAL  4.  Patient reports her lower abdominal pain decreased </= 0-1/10 with standing 1-2 hours at work due to improve strength of the core and hips.  Baseline:  Goal status: INITIAL   PLAN:  PT FREQUENCY: 1x/week  PT DURATION: 6 months  PLANNED INTERVENTIONS: 97110-Therapeutic exercises, 97530- Therapeutic activity, [id number]- Neuromuscular re-education, 97140- Manual therapy, 97014- Electrical stimulation (unattended), 97035- Ultrasound, Patient/Family education, Taping, Dry Needling, Cryotherapy, Moist heat, and Biofeedback  PLAN FOR NEXT SESSION:   pelvic floor work,  core strength,  lifting, see how it is going at home with the vaginal wand  [name], PT 11/22/23 9:34 AM

## 2023-11-23 ENCOUNTER — Other Ambulatory Visit: Payer: Self-pay | Admitting: Family Medicine

## 2023-11-23 DIAGNOSIS — F330 Major depressive disorder, recurrent, mild: Secondary | ICD-10-CM

## 2023-11-24 ENCOUNTER — Encounter: Payer: Self-pay | Admitting: Physical Therapy

## 2023-11-25 ENCOUNTER — Encounter: Payer: Self-pay | Admitting: Professional

## 2023-11-25 ENCOUNTER — Ambulatory Visit: Payer: 59 | Admitting: Professional

## 2023-11-25 DIAGNOSIS — F331 Major depressive disorder, recurrent, moderate: Secondary | ICD-10-CM

## 2023-11-25 DIAGNOSIS — F4322 Adjustment disorder with anxiety: Secondary | ICD-10-CM | POA: Diagnosis not present

## 2023-11-25 NOTE — Progress Notes (Signed)
 [facility] Counselor/Therapist Progress Note  Patient ID: Connie Carlson, MRN: [id number],    Prefers to go by Connie Carlson, her middle name  Date: 11/25/2023  Time Spent:  54 minutes [id number]   Treatment Type: Individual Therapy  Risk Assessment: Danger to Self:  No Self-injurious Behavior: No Danger to Others: No  Subjective: The patient arrived late for her inperson appointment.   Issues addressed: 1-mood -improved 2-self-care -physically active -completing PT at home for her pelvic floor -she has scheduled an appointment to [facility] -pt had days she did not feel like doing anything but it was not excessive -she has completed updates needed for her disability claim 3-social -planned a BBQ with family and friends at her sister's home -celebrated son's 22nd birthday -pt had time to prepare and execute -has been out to eat on two different occasion with different people -meeting friend/coworker Giovanna for lunch today -father was at event but was preoccupied and not emotionally available 4-problem solving -how to get necessary things done and not be overwhelmed -planning and preparing 5-positive thought -she heard a comment that she is the only human she is responsible for in her life from the time of birth to death 6-relationship with father -father was there but he wasn't there just like when she was a child   -he was present only in body not in interaction or emotional availability   -my father never had a father or mother; father absent, mother in [place] and he was raised by maternal grandparents  Treatment Plan Problems Addressed  Career Success Obstacles, Depression, Low Self-Esteem/Lack of Assertiveness, Partner Relational Problems  Goals 1. Develop a sense of own personal power and self-esteem within the relationship. 2. Develop advocacy and empowerment skills that involve taking a proactive stance in the work environment. 3.  Develop assertiveness skills and techniques for application in work-related activities (e.g., establishing boundaries with coworkers, Garment/textile technologist). 4. Develop healthy cognitive mechanisms to facilitate positive attitudes and beliefs about self within the context of one's environment to mitigate depressive symptoms. Objective Articulate signs and symptoms of depression in current life experiences. Target Date: 2024-11-03 Frequency: Biweekly  Progress: 70 Modality: individual  Related Interventions Encourage the client to share her feelings of depression to gain an insight into precipitating events and implications of symptoms; normalize her feelings of depression. Objective Implement behavioral interventions to overcome depression. Target Date: 2024-11-03 Frequency: Biweekly  Progress: 30 Modality: individual  Related Interventions Assign the client to participate as fully as possible in a healthy exercise regimen. Encourage the client to continue participation in positive social support systems. Objective Identify and replace cognitive self-talk that supports depression. Target Date: [id number] Frequency: Biweekly  Progress: 30 Modality: individual  Related Interventions Reinforce the client's positive, reality-based cognitive messages that enhance self-confidence and increase adaptive action (see "Positive Self-Talk" in the Adult Psychotherapy Homework Planner, 2nd ed. by Stephannie Li). Do "behavioral experiments" in which depressive automatic thoughts are treated as hypotheses/predictions, reality-based alternative hypotheses/ predictions are generated, and both are tested against the client's past, present, and/or future experiences. Encourage the client to discuss cognitive distortions, including automatic thoughts (e.g., negative view of self, future, experience) and negative schemas (e.g., core beliefs about self and others based on earlier childhood experiences); assess  frequency of negative self-statements associated with depression. Objective Increase the frequency of engaging in pleasant activities. Target Date: 2024-11-03 Frequency: Biweekly  Progress: 30 Modality: individual  Related Interventions Assign the client reading materials regarding overcoming depression for  women (e.g., Women and Depression: A IT sales professional by Allyne Gee; Feeling Good by Lawerance Bach; Women & Depression by Collier Bullock; Silencing the Self: Women and Depression by Ree Kida). Objective Identify precipitating events and factors of depression, particularly roles of self and others in depression. 2024-11-03 Frequency: Biweekly   Modality: individual  Related Interventions Explore the degree to which primary symptoms of depression are a result of gender role socialization (e.g., passivity, decreased self-esteem, learned helplessness, interpersonal orientation towards pleasing others). Evaluate symptoms to consider the presence of culture-bound syndromes and/or culture specificity of symptoms (e.g., ataques de nervios, amok, postemigration effects). Objective Increase the level of physical exercise. Target Date: 2024-11-03 Frequency: Biweekly  Progress: 10 Modality: individual  Objective Increase social contacts and communicate needs within existing interpersonal relationships. Target Date: 2024-11-03 Frequency: Biweekly  Progress: 30 Modality: individual  5. Explore career options that have been automatically ruled out due to low self-concept or limited opportunities. Objective Identify and replace at least three negative self-talk statements that perpetuate feelings of powerlessness or hopelessness. Target Date: 2024-11-03 Frequency: Biweekly  Progress: 10 Modality: individual  Objective Identify and replace cognitive self-talk that contributes to all-or-nothing thinking, gender-role stereotypes, or low career self-efficacy. Target Date: 2024-11-03 Frequency: Biweekly  Progress:  10 Modality: individual  Objective Develop career-related strategies to overcome and persist in the face of obstacles. Target Date: 2024-11-03 Frequency: Biweekly  Progress: 30 Modality: individual  Objective Increase self-esteem and self-concept by exploring and encompassing other roles/identities besides those of career. Target Date: 2024-11-03 Frequency: Biweekly  Progress: 20 Modality: individual  Objective Identify barriers and personal benefits to balancing career and family life. Target Date: 2024-11-03 Frequency: Biweekly  Progress: 60 Modality: individual  6. Identify and increase intrapersonal, interpersonal, and physical resources to foster positive coping strategies. 7. Improve depressed mood to maximize effective social, occupational, and physical functioning. 8. Improve self-esteem and develop a positive self-image as capable and competent. 9. Increase ability to express needs and desires openly and honestly. 10. Increase assertiveness skills and ability to advocate for self. 11. Increase awareness of own role in relationship conflicts. Objective Identify a pattern of repeatedly forming destructive intimate relationships. Target Date: 2024-11-03 Frequency: Biweekly  Progress: 60 Modality: individual  12. Increase involvement in activities that foster confidence and a sense of accomplishment. Objective Acknowledge self-disparaging statements and recognize the tendency to engage in such statements. Target Date: 2024-11-03 Frequency: Biweekly  Progress: 60 Modality: individual  Related Interventions Ask the client to complete and process an exercise in the book Ten Days to Self-Esteem! Lawerance Bach). Objective Verbalize an understanding of the differences between passive, assertive, and aggressive behavior. Target Date: 2024-11-03 Frequency: Biweekly  Progress: 50 Modality: individual  Objective Describe personal history of self-devaluation and lack of assertiveness. Target  Date: 2024-11-03 Frequency: Biweekly  Progress: 60 Modality: individual  Related Interventions Use cognitive-restructuring techniques to change the client's belief system (i.e., via reality-testing and rational thought) toward a more positive, realistic self-perception. Assist the client in becoming aware of how she indirectly expresses negative feelings about self (e.g., lack of eye contact, social withdrawal, sweating, expectations of failure or rejection). Assist the client in identifying negative beliefs about herself. Discuss context of self-disparagement, including client's perceptions of self from childhood to present, role of caregivers and peers, and related experiences. Objective Identify three roadblocks to improved self-esteem. Target Date: 2024-11-03 Frequency: Biweekly  Progress: 0 Modality: individual  Objective   Diagnosis:Major depressive disorder, recurrent episode, moderate (HCC)  Adjustment disorder with anxiety  Plan: -continue discussion about father  and his manipulation of people -meet again on Friday, December 09, 2023 at 8am in person

## 2023-11-28 ENCOUNTER — Telehealth: Payer: Self-pay | Admitting: *Deleted

## 2023-11-28 NOTE — Telephone Encounter (Signed)
 N/a sch error

## 2023-11-28 NOTE — Telephone Encounter (Signed)
 Returned call from 2:31 PM. Left patient a message to call the office back.

## 2023-11-29 ENCOUNTER — Encounter: Payer: Self-pay | Admitting: Physical Therapy

## 2023-11-29 ENCOUNTER — Ambulatory Visit: Admitting: Nurse Practitioner

## 2023-11-29 DIAGNOSIS — R1030 Lower abdominal pain, unspecified: Secondary | ICD-10-CM

## 2023-11-29 DIAGNOSIS — M6281 Muscle weakness (generalized): Secondary | ICD-10-CM

## 2023-11-29 DIAGNOSIS — R252 Cramp and spasm: Secondary | ICD-10-CM

## 2023-11-29 DIAGNOSIS — R102 Pelvic and perineal pain unspecified side: Secondary | ICD-10-CM

## 2023-11-29 DIAGNOSIS — M6289 Other specified disorders of muscle: Secondary | ICD-10-CM | POA: Diagnosis not present

## 2023-11-29 NOTE — Therapy (Signed)
 OUTPATIENT PHYSICAL THERAPY FEMALE PELVIC TREATMENT   Patient Name: Connie Carlson MRN: [id number] DOB:09-07-1981, 43 y.o., female Today's Date: 11/29/2023  END OF SESSION:  PT End of Session - 11/29/23 1036     Visit Number 9    Date for PT Re-Evaluation 02/28/24    Authorization Type [employer]    PT Start Time 1030    PT Stop Time 1115    PT Time Calculation (min) 45 min    Activity Tolerance Patient tolerated treatment well    Behavior During Therapy [facility] for tasks assessed/performed             Past Medical History:  Diagnosis Date   High blood pressure    Past Surgical History:  Procedure Laterality Date   CHOLECYSTECTOMY     mole remove     under right breast by [facility] - skin cancer   Patient Active Problem List   Diagnosis Date Noted   Other insomnia 03/30/2023   Generalized obesity 03/30/2023   BMI 37.0-37.9, adult 03/30/2023   Elevated glucose 03/30/2023   Vitamin D deficiency 03/30/2023   Health care maintenance 03/30/2023   SOB (shortness of breath) on exertion 03/30/2023   Other fatigue 03/30/2023   Major depressive disorder, recurrent episode, mild (HCC) 08/26/2022   Stress and adjustment reaction 06/29/2022   Anal fissure 01/13/2022   Depression, recurrent (HCC) 04/07/2021   Hypertension 06/11/2020   Severe obesity (BMI 35.0-39.9) with comorbidity (HCC) 06/11/2020   IFG (impaired fasting glucose) 10/08/2013   Migraine headache without aura 02/16/2013   WEIGHT GAIN 01/01/2009   Allergic rhinitis 06/28/2006   GASTROESOPHAGEAL REFLUX, NO ESOPHAGITIS 06/28/2006    PCP: Agapito Games, MD  REFERRING PROVIDER: Lennart Pall, MD   REFERRING DIAG: [phone] (ICD-10-CM) - Pelvic floor dysfunction   THERAPY DIAG:  Cramp and spasm  Pelvic pain  Lower abdominal pain  Muscle weakness (generalized)  Rationale for Evaluation and Treatment: Rehabilitation  ONSET DATE: 1/23  SUBJECTIVE:                                                                                                                                                                                            SUBJECTIVE STATEMENT: I have tried using the wand 2 times. The area is tense and sore.   PAIN:  Are you having pain? Yes NPRS scale: 7/10; 11/22/23 Pain location:  lower abdominal area  Pain type: pressure, like something is going to come out of the vagina Pain description: intermittent   Aggravating factors: lifting heavy items, standing several hours, carrying and bending over Relieving factors: Advil  PRECAUTIONS: None  RED FLAGS: None   WEIGHT BEARING RESTRICTIONS: No  FALLS:  Has patient fallen in last 6 months? No  LIVING ENVIRONMENT: Lives with: lives with their family  OCCUPATION: works at [employer] with a lot of lifting.   PLOF: Independent  PATIENT GOALS: reduce pain  PERTINENT HISTORY:  Cholecystectomy Sexual abuse: No  BOWEL MOVEMENT: no issues  URINATION: Pain with urination: No Fully empty bladder: Yes:   Stream: Strong Urgency: No Frequency: average Leakage: Coughing, sneeze, laughing Pads: No  INTERCOURSE: Pain with intercourse: After Intercourse, 10/10 Ability to have vaginal penetration:  Yes:   Climax: not when having pain Marinoff Scale: 2/3  PREGNANCY: Vaginal deliveries 1 Tearing No  OBJECTIVE:  Note: Objective measures were completed at Evaluation unless otherwise noted.  DIAGNOSTIC FINDINGS:  Ultrasound negative; biopsy is negative   COGNITION: Overall cognitive status: Within functional limits for tasks assessed     SENSATION: Light touch: Appears intact Proprioception: Appears intact   POSTURE: No Significant postural limitations  PELVIC ALIGNMENT:  LUMBARAROM/PROM: full lumbar ROM   LOWER EXTREMITY ROM: full bilateral hip ROM   LOWER EXTREMITY MMT: Bilateral hip strength is 4/5.    PALPATION:   General  tenderness located in the lower abdomen, decreased movement of  the lower rib cage, good mobility of the diaphragm, tenderness in the buttocks                External Perineal Exam tenderness located in the bulbocavernosus, ischiocavernosus, perineal body                             Internal Pelvic Floor levator ani, obturator internist, side of the introitus, sides of the bladder  Patient confirms identification and approves PT to assess internal pelvic floor and treatment Yes  PELVIC MMT:   MMT eval 10/11/23  Vaginal 3/5 holding for 6 seconds 3/5  (Blank rows = not tested)        TONE: increased  PROLAPSE: none  TODAY'S TREATMENT:    11/29/23 Manual: Soft tissue mobilization: Manual work to the hip adductors to reduce the tension Myofascial release: Fascial release around the urogenital diaphragm going through the different layers  Internal pelvic floor techniques: No emotional/communication barriers or cognitive limitation. Patient is motivated to learn. Patient understands and agrees with treatment goals and plan. PT explains patient will be examined in standing, sitting, and lying down to see how their muscles and joints work. When they are ready, they will be asked to remove their underwear so PT can examine their perineum. The patient is also given the option of providing their own chaperone as one is not provided in our facility. The patient also has the right and is explained the right to defer or refuse any part of the evaluation or treatment including the internal exam. With the patient's consent, PT will use one gloved finger to gently assess the muscles of the pelvic floor, seeing how well it contracts and relaxes and if there is muscle symmetry. After, the patient will get dressed and PT and patient will discuss exam findings and plan of care. PT and patient discuss plan of care, schedule, attendance policy and HEP activities.  Going through the vaginal canal working on the levator ani, obturator internist, along the Alcock's canal,  sides of the bladder, distraction of the bladder and uterus and urethra sphincter Neuromuscular re-education: Down training: Diaphragmatic breathing with tactile cues to the pelvic floor and abdomen to get  the pelvic drop motion Therapeutic activities: Functional strengthening activities: Discussed with patient on using the vaginal wand, showed her internally where to place the wand, showed her how much pressure to use on the wand.    11/22/23 Manual: Internal pelvic floor techniques: No emotional/communication barriers or cognitive limitation. Patient is motivated to learn. Patient understands and agrees with treatment goals and plan. PT explains patient will be examined in standing, sitting, and lying down to see how their muscles and joints work. When they are ready, they will be asked to remove their underwear so PT can examine their perineum. The patient is also given the option of providing their own chaperone as one is not provided in our facility. The patient also has the right and is explained the right to defer or refuse any part of the evaluation or treatment including the internal exam. With the patient's consent, PT will use one gloved finger to gently assess the muscles of the pelvic floor, seeing how well it contracts and relaxes and if there is muscle symmetry. After, the patient will get dressed and PT and patient will discuss exam findings and plan of care. PT and patient discuss plan of care, schedule, attendance policy and HEP activities.  Working on the round ligament on the right going through all of the restrictions.  Working on the right labia and ischiocavernosus and bulbocavernosus Release of the mons pubis Neuromuscular re-education: Core facilitation: Supine heel slides to engage the lower abdominals and stretch the anterior hip Supine knee fall out to stretch the anterior hip and work on abdominals Self-care: Educated patient on how to use the vaginal wand, using  lubricant, how to work on the trigger points and release them, what position she should be in Educated patient on how to perform manual work to the lower abdominal and anterior hip area to manage her pain    11/15/23 Manual: Soft tissue mobilization: Manual work to bilateral hip adductors, quadriceps, and ITB to reduce trigger points Myofascial release: Around the urogenital diaphragm, along the ischiocavernosus, bulbocavernosus and obturator internist to reduce trigger points Exercises: Stretches/mobility: Piriformis stretch in sitting holding for 30 sec bil.  Lunge hip adductor stretch moving foot in and out bil.  Deep squat to stretch the pelvic floor Standing bilateral hamstring stretch with forward fold stretch holding 30 sec.  Self-care: Discussed with patient on seeing a therapist to assist with her fear of penile penetration vaginally. This fear prevents her from being intimate with her husband. She sees a therapist presently and will discuss with her.         PATIENT EDUCATION:  11/22/23 Education details: Access Code: [email or web address], educated patient on using the vaginal wand.  Person educated: Patient Education method: Explanation, Demonstration, Tactile cues, Verbal cues, and Handouts Education comprehension: verbalized understanding, returned demonstration, verbal cues required, tactile cues required, and needs further education  HOME EXERCISE PROGRAM: 11/22/23 Access Code: [email or web address] URL: [email or web address] Date: 11/22/2023 Prepared by: [name]  Exercises - Seated Piriformis Stretch with Trunk Bend  - 1 x daily - 7 x weekly - 1 sets - 1 reps - 30 sec hold - Seated Hamstring Stretch  - 1 x daily - 7 x weekly - 1 sets - 1 reps - 30 sec hold - Seated Happy Baby With Trunk Flexion For Pelvic Relaxation  - 1 x daily - 7 x weekly - 1 sets - 1 reps - 30 sec hold - Cat Cow  - 1 x daily - 7 x weekly -  1 sets - 10 reps - Diaphragmatic Breathing in Child's Pose with  Pelvic Floor Relaxation  - 1 x daily - 7 x weekly - 1 sets - 5 reps - 10 sec hold - Supine Diaphragmatic Breathing  - 1 x daily - 7 x weekly - 1 sets - 10 reps - Sidelying Thoracic Rotation with Open Book  - 1 x daily - 2 x weekly - 1 sets - 10 reps - Diaphragmatic Breathing with Hips Elevated (for Pelvic Organ Prolapse)  - 1 x daily - 7 x weekly - 3 sets - 10 reps - Bent Knee Fallouts  - 1 x daily - 3 x weekly - 2 sets - 10 reps - Supine Transversus Abdominis Bracing with Heel Slide  - 1 x daily - 3 x weekly - 2 sets - 10 reps    ASSESSMENT:  CLINICAL IMPRESSION: Patient is a 43 y.o. female who was seen today for physical therapy  treatment for pelvic floor dysfunction. Patient is waking up 0-1 time per night.   Pain level decreased from 7/10 to 5/10. There was increased movement of the bladder after the manual work. She had more trigger points on the right pelvic floor muscles compared to the left. She was able to do a pelvic floor drop by the end of the treatment. Patient will benefit from skilled therapy to reduce her pain to restore her function.    OBJECTIVE IMPAIRMENTS: decreased activity tolerance, decreased strength, increased fascial restrictions, increased muscle spasms, and pain.   ACTIVITY LIMITATIONS: lifting and standing  PARTICIPATION LIMITATIONS: cleaning, laundry, shopping, community activity, and occupation  PERSONAL FACTORS: Fitness are also affecting patient's functional outcome.   REHAB POTENTIAL: Excellent  CLINICAL DECISION MAKING: Stable/uncomplicated  EVALUATION COMPLEXITY: Low   GOALS: Goals reviewed with patient? Yes  SHORT TERM GOALS: Target date: 09/26/23  Patient independent with initial HEP for flexibility and diaphragmatic  breathing.  Baseline: Goal status: Met 10/04/23  2.  Patient educated on correct lifting technique to reduce strain on the pelvic floor.  Baseline:  Goal status: met 11/15/23  3.  Patient educated on perineal massage to reduce  trigger points.  Baseline: educated patient on vaginal wand Goal status: Met 11/15/23   LONG TERM GOALS: Target date: 02/1024  Patient independent with advanced HEP for core and pelvic floor strength.  Baseline:  Goal status: INITIAL  2.  Patient able to lift 20# with minimal to no pain due to reduction of trigger points in the pelvic floor.  Baseline:  Goal status: INITIAL  3.  Patient reports minimal to no pain after penile penetration due to the ability to lengthen the pelvic floor.  Baseline:  Goal status: INITIAL  4.  Patient reports her lower abdominal pain decreased </= 0-1/10 with standing 1-2 hours at work due to improve strength of the core and hips.  Baseline:  Goal status: INITIAL   PLAN:  PT FREQUENCY: 1x/week  PT DURATION: 6 months  PLANNED INTERVENTIONS: 97110-Therapeutic exercises, 97530- Therapeutic activity, [id number]- Neuromuscular re-education, 97140- Manual therapy, 97014- Electrical stimulation (unattended), 97035- Ultrasound, Patient/Family education, Taping, Dry Needling, Cryotherapy, Moist heat, and Biofeedback  PLAN FOR NEXT SESSION:  internal  pelvic floor work,  core strength,  lifting, see how it is going at home with the vaginal wand  [name], PT 11/29/23 11:28 AM

## 2023-12-04 ENCOUNTER — Ambulatory Visit (HOSPITAL_BASED_OUTPATIENT_CLINIC_OR_DEPARTMENT_OTHER): Admission: RE | Admit: 2023-12-04 | Source: Ambulatory Visit

## 2023-12-04 ENCOUNTER — Encounter (HOSPITAL_BASED_OUTPATIENT_CLINIC_OR_DEPARTMENT_OTHER): Payer: Self-pay

## 2023-12-05 ENCOUNTER — Telehealth: Payer: Self-pay | Admitting: Family Medicine

## 2023-12-05 ENCOUNTER — Ambulatory Visit: Admitting: Family Medicine

## 2023-12-05 NOTE — Telephone Encounter (Signed)
 Call pt- we received paperwor from [employer]. Not sure why,  what is it for? It says she is out of work but I haven't seen her since December.

## 2023-12-09 ENCOUNTER — Encounter: Payer: Self-pay | Admitting: Professional

## 2023-12-09 ENCOUNTER — Other Ambulatory Visit: Payer: Self-pay | Admitting: Nurse Practitioner

## 2023-12-09 ENCOUNTER — Ambulatory Visit (INDEPENDENT_AMBULATORY_CARE_PROVIDER_SITE_OTHER): Payer: 59 | Admitting: Professional

## 2023-12-09 DIAGNOSIS — E66813 Obesity, class 3: Secondary | ICD-10-CM

## 2023-12-09 DIAGNOSIS — F4322 Adjustment disorder with anxiety: Secondary | ICD-10-CM | POA: Diagnosis not present

## 2023-12-09 DIAGNOSIS — F331 Major depressive disorder, recurrent, moderate: Secondary | ICD-10-CM | POA: Diagnosis not present

## 2023-12-09 NOTE — Progress Notes (Signed)
 [facility] Counselor/Therapist Progress Note  Patient ID: Connie Carlson, MRN: [id number],    Prefers to go by [name], her middle name  Date: 12/09/2023  Time Spent:  54 minutes 803-845am   Treatment Type: Individual Therapy  Risk Assessment: Danger to Self:  No Self-injurious Behavior: No Danger to Others: No  Subjective: The patient arrived on time for her inperson appointment.   Issues addressed: 1-anxiety a-pt preoccupied with fear that she will lose her job -she cannot get her Gyn or the PT to sign off on her disability paperwork -she had an appointment with a new MD at [facility] who completed the paperwork but it was their first contact -pt has talked to McColl, the disability rep, about her concern related to no one to sign off -Barbara Cower rejected the new MD signature since there was no history b-discussed worry chains and not jumping to catastrophic outcomes (job loss) c-pt able to verbalize her understanding of how she is not defining "real" vs "hypothetical" situations 2-problem solving -using logical thinking vs. Emotional -how to problem solve potential solutions and not jump to conclusions -asked pt if she had consulted PCP and she canceled her appt after meeting with [facility] MD. -suggested pt send a message through MyChart about her need to her PCP  Treatment Plan Problems Addressed  Career Success Obstacles, Depression, Low Self-Esteem/Lack of Assertiveness, Partner Relational Problems  Goals 1. Develop a sense of own personal power and self-esteem within the relationship. 2. Develop advocacy and empowerment skills that involve taking a proactive stance in the work environment. 3. Develop assertiveness skills and techniques for application in work-related activities (e.g., establishing boundaries with coworkers, Garment/textile technologist). 4. Develop healthy cognitive mechanisms to facilitate positive attitudes and beliefs about self within  the context of one's environment to mitigate depressive symptoms. Objective Articulate signs and symptoms of depression in current life experiences. Target Date: 2024-11-03 Frequency: Biweekly  Progress: 70 Modality: individual  Related Interventions Encourage the client to share her feelings of depression to gain an insight into precipitating events and implications of symptoms; normalize her feelings of depression. Objective Implement behavioral interventions to overcome depression. Target Date: 2024-11-03 Frequency: Biweekly  Progress: 30 Modality: individual  Related Interventions Assign the client to participate as fully as possible in a healthy exercise regimen. Encourage the client to continue participation in positive social support systems. Objective Identify and replace cognitive self-talk that supports depression. Target Date: [id number] Frequency: Biweekly  Progress: 30 Modality: individual  Related Interventions Reinforce the client's positive, reality-based cognitive messages that enhance self-confidence and increase adaptive action (see "Positive Self-Talk" in the Adult Psychotherapy Homework Planner, 2nd ed. by Stephannie Li). Do "behavioral experiments" in which depressive automatic thoughts are treated as hypotheses/predictions, reality-based alternative hypotheses/ predictions are generated, and both are tested against the client's past, present, and/or future experiences. Encourage the client to discuss cognitive distortions, including automatic thoughts (e.g., negative view of self, future, experience) and negative schemas (e.g., core beliefs about self and others based on earlier childhood experiences); assess frequency of negative self-statements associated with depression. Objective Increase the frequency of engaging in pleasant activities. Target Date: 2024-11-03 Frequency: Biweekly  Progress: 30 Modality: individual  Related Interventions Assign the client  reading materials regarding overcoming depression for women (e.g., Women and Depression: A Practical Self-Help Guide by Allyne Gee; Feeling Good by Lawerance Bach; Women & Depression by Collier Bullock; Silencing the Self: Women and Depression by Ree Kida). Objective Identify precipitating events and factors of depression, particularly roles of self  and others in depression. 2024-11-03 Frequency: Biweekly   Modality: individual  Related Interventions Explore the degree to which primary symptoms of depression are a result of gender role socialization (e.g., passivity, decreased self-esteem, learned helplessness, interpersonal orientation towards pleasing others). Evaluate symptoms to consider the presence of culture-bound syndromes and/or culture specificity of symptoms (e.g., ataques de nervios, amok, postemigration effects). Objective Increase the level of physical exercise. Target Date: 2024-11-03 Frequency: Biweekly  Progress: 10 Modality: individual  Objective Increase social contacts and communicate needs within existing interpersonal relationships. Target Date: 2024-11-03 Frequency: Biweekly  Progress: 30 Modality: individual  5. Explore career options that have been automatically ruled out due to low self-concept or limited opportunities. Objective Identify and replace at least three negative self-talk statements that perpetuate feelings of powerlessness or hopelessness. Target Date: 2024-11-03 Frequency: Biweekly  Progress: 10 Modality: individual  Objective Identify and replace cognitive self-talk that contributes to all-or-nothing thinking, gender-role stereotypes, or low career self-efficacy. Target Date: 2024-11-03 Frequency: Biweekly  Progress: 10 Modality: individual  Objective Develop career-related strategies to overcome and persist in the face of obstacles. Target Date: 2024-11-03 Frequency: Biweekly  Progress: 30 Modality: individual  Objective Increase self-esteem and self-concept by  exploring and encompassing other roles/identities besides those of career. Target Date: 2024-11-03 Frequency: Biweekly  Progress: 20 Modality: individual  Objective Identify barriers and personal benefits to balancing career and family life. Target Date: 2024-11-03 Frequency: Biweekly  Progress: 60 Modality: individual  6. Identify and increase intrapersonal, interpersonal, and physical resources to foster positive coping strategies. 7. Improve depressed mood to maximize effective social, occupational, and physical functioning. 8. Improve self-esteem and develop a positive self-image as capable and competent. 9. Increase ability to express needs and desires openly and honestly. 10. Increase assertiveness skills and ability to advocate for self. 11. Increase awareness of own role in relationship conflicts. Objective Identify a pattern of repeatedly forming destructive intimate relationships. Target Date: 2024-11-03 Frequency: Biweekly  Progress: 60 Modality: individual  12. Increase involvement in activities that foster confidence and a sense of accomplishment. Objective Acknowledge self-disparaging statements and recognize the tendency to engage in such statements. Target Date: 2024-11-03 Frequency: Biweekly  Progress: 60 Modality: individual  Related Interventions Ask the client to complete and process an exercise in the book Ten Days to Self-Esteem! Lawerance Bach). Objective Verbalize an understanding of the differences between passive, assertive, and aggressive behavior. Target Date: 2024-11-03 Frequency: Biweekly  Progress: 50 Modality: individual  Objective Describe personal history of self-devaluation and lack of assertiveness. Target Date: 2024-11-03 Frequency: Biweekly  Progress: 60 Modality: individual  Related Interventions Use cognitive-restructuring techniques to change the client's belief system (i.e., via reality-testing and rational thought) toward a more positive, realistic  self-perception. Assist the client in becoming aware of how she indirectly expresses negative feelings about self (e.g., lack of eye contact, social withdrawal, sweating, expectations of failure or rejection). Assist the client in identifying negative beliefs about herself. Discuss context of self-disparagement, including client's perceptions of self from childhood to present, role of caregivers and peers, and related experiences. Objective Identify three roadblocks to improved self-esteem. Target Date: 2024-11-03 Frequency: Biweekly  Progress: 0 Modality: individual  Objective   Diagnosis:Major depressive disorder, recurrent episode, moderate (HCC)  Adjustment disorder with anxiety  Plan:  -meet again on Friday, December 23, 2023 at 8am in person

## 2023-12-09 NOTE — Telephone Encounter (Signed)
 Called pt she stated that these forms were actually supposed to go to her GYN and to PT however neither of them will complete this. She stated that she believes that she has enough information to provide [employer] what they need.  She has an appointment scheduled with Dr. Linford Arnold on Tuesday to f/u with her migraines.

## 2023-12-13 ENCOUNTER — Ambulatory Visit: Admitting: Family Medicine

## 2023-12-13 ENCOUNTER — Encounter: Payer: Self-pay | Admitting: Physical Therapy

## 2023-12-13 ENCOUNTER — Ambulatory Visit: Admitting: Nurse Practitioner

## 2023-12-13 DIAGNOSIS — R252 Cramp and spasm: Secondary | ICD-10-CM

## 2023-12-13 DIAGNOSIS — R1030 Lower abdominal pain, unspecified: Secondary | ICD-10-CM

## 2023-12-13 DIAGNOSIS — M6281 Muscle weakness (generalized): Secondary | ICD-10-CM

## 2023-12-13 DIAGNOSIS — M6289 Other specified disorders of muscle: Secondary | ICD-10-CM | POA: Diagnosis not present

## 2023-12-13 DIAGNOSIS — R102 Pelvic and perineal pain unspecified side: Secondary | ICD-10-CM

## 2023-12-13 NOTE — Therapy (Signed)
 OUTPATIENT PHYSICAL THERAPY FEMALE PELVIC TREATMENT   Patient Name: Connie Carlson MRN: [id number] DOB:04-Nov-1980, 43 y.o., female Today's Date: 12/13/2023  END OF SESSION:  PT End of Session - 12/13/23 1044     Visit Number 10    Date for PT Re-Evaluation 02/28/24    Authorization Type [employer]    PT Start Time 1030    PT Stop Time 1150    PT Time Calculation (min) 80 min    Activity Tolerance Patient tolerated treatment well    Behavior During Therapy [facility] for tasks assessed/performed             Past Medical History:  Diagnosis Date   High blood pressure    Past Surgical History:  Procedure Laterality Date   CHOLECYSTECTOMY     mole remove     under right breast by [facility] - skin cancer   Patient Active Problem List   Diagnosis Date Noted   Other insomnia 03/30/2023   Generalized obesity 03/30/2023   BMI 37.0-37.9, adult 03/30/2023   Elevated glucose 03/30/2023   Vitamin D deficiency 03/30/2023   Health care maintenance 03/30/2023   SOB (shortness of breath) on exertion 03/30/2023   Other fatigue 03/30/2023   Major depressive disorder, recurrent episode, mild (HCC) 08/26/2022   Stress and adjustment reaction 06/29/2022   Anal fissure 01/13/2022   Depression, recurrent (HCC) 04/07/2021   Hypertension 06/11/2020   Severe obesity (BMI 35.0-39.9) with comorbidity (HCC) 06/11/2020   IFG (impaired fasting glucose) 10/08/2013   Migraine headache without aura 02/16/2013   WEIGHT GAIN 01/01/2009   Allergic rhinitis 06/28/2006   GASTROESOPHAGEAL REFLUX, NO ESOPHAGITIS 06/28/2006    PCP: Agapito Games, MD  REFERRING PROVIDER: Lennart Pall, MD   REFERRING DIAG: [phone] (ICD-10-CM) - Pelvic floor dysfunction   THERAPY DIAG:  Cramp and spasm  Pelvic pain  Lower abdominal pain  Muscle weakness (generalized)  Rationale for Evaluation and Treatment: Rehabilitation  ONSET DATE: 1/23  SUBJECTIVE:                                                                                                                                                                                            SUBJECTIVE STATEMENT: I have tried using the wand 2 times. I am walking 4 times per week. I will be sore after using the wand but better after 30 minutes more relaxed.   PAIN:  Are you having pain? Yes NPRS scale: 7/10; 11/22/23 12/13/23 5/10 Pain location:  lower abdominal area  Pain type: pressure, like something is going to come out of the vagina Pain description: intermittent   Aggravating  factors: lifting heavy items, standing several hours, carrying and bending over Relieving factors: Advil  PRECAUTIONS: None  RED FLAGS: None   WEIGHT BEARING RESTRICTIONS: No  FALLS:  Has patient fallen in last 6 months? No  LIVING ENVIRONMENT: Lives with: lives with their family  OCCUPATION: works at [employer] with a lot of lifting.   PLOF: Independent  PATIENT GOALS: reduce pain  PERTINENT HISTORY:  Cholecystectomy Sexual abuse: No  BOWEL MOVEMENT: no issues  URINATION: Pain with urination: No Fully empty bladder: Yes:   Stream: Strong Urgency: No Frequency: average Leakage: Coughing, sneeze, laughing 12/13/23: leakage 30 % better.  Pads: No  INTERCOURSE: Pain with intercourse: After Intercourse, 10/10 Ability to have vaginal penetration:  Yes:   Climax: not when having pain Marinoff Scale: 2/3  PREGNANCY: Vaginal deliveries 1 Tearing No  OBJECTIVE:  Note: Objective measures were completed at Evaluation unless otherwise noted.  DIAGNOSTIC FINDINGS:  Ultrasound negative; biopsy is negative   COGNITION: Overall cognitive status: Within functional limits for tasks assessed     SENSATION: Light touch: Appears intact Proprioception: Appears intact   POSTURE: No Significant postural limitations  PELVIC ALIGNMENT:  LUMBARAROM/PROM: full lumbar ROM   LOWER EXTREMITY ROM: full bilateral hip ROM   LOWER EXTREMITY  MMT: Bilateral hip strength is 4/5.    PALPATION:   General  tenderness located in the lower abdomen, decreased movement of the lower rib cage, good mobility of the diaphragm, tenderness in the buttocks                External Perineal Exam tenderness located in the bulbocavernosus, ischiocavernosus, perineal body                             Internal Pelvic Floor levator ani, obturator internist, side of the introitus, sides of the bladder  Patient confirms identification and approves PT to assess internal pelvic floor and treatment Yes  PELVIC MMT:   MMT eval 10/11/23  Vaginal 3/5 holding for 6 seconds 3/5  (Blank rows = not tested)        TONE: increased  PROLAPSE: None 12/13/23: hen patient bears down in supine anterior wall weakness to the introitus   TODAY'S TREATMENT:    12/13/23 Manual: Soft tissue mobilization: Scar tissue mobilization: Myofascial release: Spinal mobilization: Internal pelvic floor techniques: No emotional/communication barriers or cognitive limitation. Patient is motivated to learn. Patient understands and agrees with treatment goals and plan. PT explains patient will be examined in standing, sitting, and lying down to see how their muscles and joints work. When they are ready, they will be asked to remove their underwear so PT can examine their perineum. The patient is also given the option of providing their own chaperone as one is not provided in our facility. The patient also has the right and is explained the right to defer or refuse any part of the evaluation or treatment including the internal exam. With the patient's consent, PT will use one gloved finger to gently assess the muscles of the pelvic floor, seeing how well it contracts and relaxes and if there is muscle symmetry. After, the patient will get dressed and PT and patient will discuss exam findings and plan of care. PT and patient discuss plan of care, schedule, attendance policy and HEP  activities.  Going through the vaginal canal working on the obturator internist, levator ani, around the urethra and bladder.  Dry needling: Neuromuscular re-education: Core retraining: Transverse  abdominus contraction in supine  10 x Supine heel slide with core engaged and pelvic floor contraction 10 x each Quadruped lift lower extremity 10 x each Supine bridge 20 x Supine alternate shoulder flexion with core and pelvic floor engaged 20 x Form correction: Supine with laying on foam roll to increased mobility of the thoracic lumbar junction to reduce pain with diaphragmatic breathing Down training: Diaphragmatic breathing to relax the pelvic floor as therapist finger in the vaginal canal to feel the pelvic floor muscles relax Self-care: Educated patient on what a prolapse is, how it can cause pain in the lower abdominal area, how to manage the pressure by laying on back with legs elevated    11/29/23 Manual: Soft tissue mobilization: Manual work to the hip adductors to reduce the tension Myofascial release: Fascial release around the urogenital diaphragm going through the different layers  Internal pelvic floor techniques: No emotional/communication barriers or cognitive limitation. Patient is motivated to learn. Patient understands and agrees with treatment goals and plan. PT explains patient will be examined in standing, sitting, and lying down to see how their muscles and joints work. When they are ready, they will be asked to remove their underwear so PT can examine their perineum. The patient is also given the option of providing their own chaperone as one is not provided in our facility. The patient also has the right and is explained the right to defer or refuse any part of the evaluation or treatment including the internal exam. With the patient's consent, PT will use one gloved finger to gently assess the muscles of the pelvic floor, seeing how well it contracts and relaxes and if  there is muscle symmetry. After, the patient will get dressed and PT and patient will discuss exam findings and plan of care. PT and patient discuss plan of care, schedule, attendance policy and HEP activities.  Going through the vaginal canal working on the levator ani, obturator internist, along the Alcock's canal, sides of the bladder, distraction of the bladder and uterus and urethra sphincter Neuromuscular re-education: Down training: Diaphragmatic breathing with tactile cues to the pelvic floor and abdomen to get the pelvic drop motion Therapeutic activities: Functional strengthening activities: Discussed with patient on using the vaginal wand, showed her internally where to place the wand, showed her how much pressure to use on the wand.    11/22/23 Manual: Internal pelvic floor techniques: No emotional/communication barriers or cognitive limitation. Patient is motivated to learn. Patient understands and agrees with treatment goals and plan. PT explains patient will be examined in standing, sitting, and lying down to see how their muscles and joints work. When they are ready, they will be asked to remove their underwear so PT can examine their perineum. The patient is also given the option of providing their own chaperone as one is not provided in our facility. The patient also has the right and is explained the right to defer or refuse any part of the evaluation or treatment including the internal exam. With the patient's consent, PT will use one gloved finger to gently assess the muscles of the pelvic floor, seeing how well it contracts and relaxes and if there is muscle symmetry. After, the patient will get dressed and PT and patient will discuss exam findings and plan of care. PT and patient discuss plan of care, schedule, attendance policy and HEP activities.  Working on the round ligament on the right going through all of the restrictions.  Working on the right  labia and ischiocavernosus  and bulbocavernosus Release of the mons pubis Neuromuscular re-education: Core facilitation: Supine heel slides to engage the lower abdominals and stretch the anterior hip Supine knee fall out to stretch the anterior hip and work on abdominals Self-care: Educated patient on how to use the vaginal wand, using lubricant, how to work on the trigger points and release them, what position she should be in Educated patient on how to perform manual work to the lower abdominal and anterior hip area to manage her pain       PATIENT EDUCATION:  12/13/23 Education details: Access Code: [email or web address], educated patient on using the vaginal wand.  Person educated: Patient Education method: Explanation, Demonstration, Tactile cues, Verbal cues, and Handouts Education comprehension: verbalized understanding, returned demonstration, verbal cues required, tactile cues required, and needs further education  HOME EXERCISE PROGRAM: 12/13/23 Access Code: [email or web address] URL: [email or web address] Date: 12/13/2023 Prepared by: [name]  Exercises - Seated Piriformis Stretch with Trunk Bend  - 1 x daily - 7 x weekly - 1 sets - 1 reps - 30 sec hold - Seated Hamstring Stretch  - 1 x daily - 7 x weekly - 1 sets - 1 reps - 30 sec hold - Seated Happy Baby With Trunk Flexion For Pelvic Relaxation  - 1 x daily - 7 x weekly - 1 sets - 1 reps - 30 sec hold - Cat Cow  - 1 x daily - 7 x weekly - 1 sets - 10 reps - Diaphragmatic Breathing in Child's Pose with Pelvic Floor Relaxation  - 1 x daily - 7 x weekly - 1 sets - 5 reps - 10 sec hold - Supine Diaphragmatic Breathing  - 1 x daily - 7 x weekly - 1 sets - 10 reps - Sidelying Thoracic Rotation with Open Book  - 1 x daily - 2 x weekly - 1 sets - 10 reps - Diaphragmatic Breathing with Hips Elevated (for Pelvic Organ Prolapse)  - 1 x daily - 7 x weekly - 3 sets - 10 reps - Bent Knee Fallouts  - 1 x daily - 3 x weekly - 2 sets - 10 reps - Supine Transversus  Abdominis Bracing with Heel Slide  - 1 x daily - 3 x weekly - 2 sets - 10 reps - Diaphragmatic Breathing with Hips Elevated (for Pelvic Organ Prolapse)  - 2 x daily - 7 x weekly - 1 sets - 10 reps - Supine Bridge with Pelvic Floor Contraction  - 1 x daily - 3 x weekly - 1 sets - 10 reps - Supine Alternating Shoulder Flexion  - 1 x daily - 3 x weekly - 2 sets - 10 reps - Quadruped Leg Lifts  - 1 x daily - 3 x weekly - 2 sets - 10 reps     ASSESSMENT:  CLINICAL IMPRESSION: Patient is a 43 y.o. female who was seen today for physical therapy  treatment for pelvic floor dysfunction.Urinary leakage is 30% better. Patient is using the pelvic wand 2-3 times per week.   Pain level decreased from 7/10 to 5/10. She is learning how to strengthen her core correctly. She does have anterior wall weakness and explained to her what that is. She continues to have trigger points in the pelvic floor but responds well to the soft tissue work. She was able to do a pelvic floor drop by the end of the treatment. Patient will benefit from skilled therapy to reduce her pain to restore her function.  OBJECTIVE IMPAIRMENTS: decreased activity tolerance, decreased strength, increased fascial restrictions, increased muscle spasms, and pain.   ACTIVITY LIMITATIONS: lifting and standing  PARTICIPATION LIMITATIONS: cleaning, laundry, shopping, community activity, and occupation  PERSONAL FACTORS: Fitness are also affecting patient's functional outcome.   REHAB POTENTIAL: Excellent  CLINICAL DECISION MAKING: Stable/uncomplicated  EVALUATION COMPLEXITY: Low   GOALS: Goals reviewed with patient? Yes  SHORT TERM GOALS: Target date: 09/26/23  Patient independent with initial HEP for flexibility and diaphragmatic  breathing.  Baseline: Goal status: Met 10/04/23  2.  Patient educated on correct lifting technique to reduce strain on the pelvic floor.  Baseline:  Goal status: met 11/15/23  3.  Patient educated on  perineal massage to reduce trigger points.  Baseline: educated patient on vaginal wand Goal status: Met 11/15/23   LONG TERM GOALS: Target date: 02/1024  Patient independent with advanced HEP for core and pelvic floor strength.  Baseline:  Goal status: INITIAL  2.  Patient able to lift 20# with minimal to no pain due to reduction of trigger points in the pelvic floor.  Baseline:  Goal status: INITIAL  3.  Patient reports minimal to no pain after penile penetration due to the ability to lengthen the pelvic floor.  Baseline:  Goal status: INITIAL  4.  Patient reports her lower abdominal pain decreased </= 0-1/10 with standing 1-2 hours at work due to improve strength of the core and hips.  Baseline:  Goal status: INITIAL   PLAN:  PT FREQUENCY: 1x/week  PT DURATION: 6 months  PLANNED INTERVENTIONS: 97110-Therapeutic exercises, 97530- Therapeutic activity, [id number]- Neuromuscular re-education, 97140- Manual therapy, 97014- Electrical stimulation (unattended), 97035- Ultrasound, Patient/Family education, Taping, Dry Needling, Cryotherapy, Moist heat, and Biofeedback  PLAN FOR NEXT SESSION:  internal  pelvic floor work,  core strength,  lifting, prolapse management  [name], PT 12/13/23 12:13 PM

## 2023-12-20 ENCOUNTER — Encounter: Payer: Self-pay | Attending: Obstetrics and Gynecology | Admitting: Physical Therapy

## 2023-12-20 ENCOUNTER — Encounter: Payer: Self-pay | Admitting: Physical Therapy

## 2023-12-20 ENCOUNTER — Encounter: Payer: Self-pay | Admitting: Family Medicine

## 2023-12-20 DIAGNOSIS — M6281 Muscle weakness (generalized): Secondary | ICD-10-CM | POA: Diagnosis present

## 2023-12-20 DIAGNOSIS — R1030 Lower abdominal pain, unspecified: Secondary | ICD-10-CM | POA: Insufficient documentation

## 2023-12-20 DIAGNOSIS — R102 Pelvic and perineal pain: Secondary | ICD-10-CM | POA: Insufficient documentation

## 2023-12-20 DIAGNOSIS — R252 Cramp and spasm: Secondary | ICD-10-CM | POA: Insufficient documentation

## 2023-12-20 NOTE — Therapy (Signed)
 OUTPATIENT PHYSICAL THERAPY FEMALE PELVIC TREATMENT   Patient Name: Connie Carlson MRN: [id number] DOB:[birth year], 43 y.o., female Today's Date: 12/20/2023  END OF SESSION:  PT End of Session - 12/20/23 1030     Visit Number 11    Date for PT Re-Evaluation 02/28/24    Authorization Type [employer]    PT Start Time 1030    PT Stop Time 1115    PT Time Calculation (min) 45 min    Activity Tolerance Patient tolerated treatment well    Behavior During Therapy [facility] for tasks assessed/performed             Past Medical History:  Diagnosis Date   High blood pressure    Past Surgical History:  Procedure Laterality Date   CHOLECYSTECTOMY     mole remove     under right breast by [facility] - skin cancer   Patient Active Problem List   Diagnosis Date Noted   Other insomnia 03/30/2023   Generalized obesity 03/30/2023   BMI 37.0-37.9, adult 03/30/2023   Elevated glucose 03/30/2023   Vitamin D deficiency 03/30/2023   Health care maintenance 03/30/2023   SOB (shortness of breath) on exertion 03/30/2023   Other fatigue 03/30/2023   Major depressive disorder, recurrent episode, mild (HCC) 08/26/2022   Stress and adjustment reaction 06/29/2022   Anal fissure 01/13/2022   Depression, recurrent (HCC) 04/07/2021   Hypertension 06/11/2020   Severe obesity (BMI 35.0-39.9) with comorbidity (HCC) 06/11/2020   IFG (impaired fasting glucose) 10/08/2013   Migraine headache without aura 02/16/2013   WEIGHT GAIN 01/01/2009   Allergic rhinitis 06/28/2006   GASTROESOPHAGEAL REFLUX, NO ESOPHAGITIS 06/28/2006    PCP: Agapito Games, MD  REFERRING PROVIDER: Lennart Pall, MD   REFERRING DIAG: [phone] (ICD-10-CM) - Pelvic floor dysfunction   THERAPY DIAG:  Cramp and spasm  Pelvic pain  Lower abdominal pain  Muscle weakness (generalized)  Rationale for Evaluation and Treatment: Rehabilitation  ONSET DATE: 1/23  SUBJECTIVE:                                                                                                                                                                                            SUBJECTIVE STATEMENT:  I am starting to feel better. I am not going to the bathroom at night. When patient feels the pressure she will lay down and elevate her legs.   PAIN:  Are you having pain? Yes NPRS scale: 7/10; 11/22/23 12/13/23 5/10 12/20/23: pain level is 5/10 Pain location:  lower abdominal area  Pain type: pressure, like something is going to come out of the vagina Pain description:  intermittent   Aggravating factors: lifting heavy items, standing several hours, carrying and bending over Relieving factors: Advil  PRECAUTIONS: None  RED FLAGS: None   WEIGHT BEARING RESTRICTIONS: No  FALLS:  Has patient fallen in last 6 months? No  LIVING ENVIRONMENT: Lives with: lives with their family  OCCUPATION: works at [employer] with a lot of lifting.   PLOF: Independent  PATIENT GOALS: reduce pain  PERTINENT HISTORY:  Cholecystectomy Sexual abuse: No  BOWEL MOVEMENT: no issues  URINATION: Pain with urination: No Fully empty bladder: Yes:   Stream: Strong Urgency: No Frequency: average Leakage: Coughing, sneeze, laughing 12/13/23: leakage 30 % better.  12/20/23: urinary leakage is 40% better Pads: No  INTERCOURSE: Pain with intercourse: After Intercourse, 10/10 Ability to have vaginal penetration:  Yes:   Climax: not when having pain Marinoff Scale: 2/3  PREGNANCY: Vaginal deliveries 1 Tearing No  OBJECTIVE:  Note: Objective measures were completed at Evaluation unless otherwise noted.  DIAGNOSTIC FINDINGS:  Ultrasound negative; biopsy is negative   COGNITION: Overall cognitive status: Within functional limits for tasks assessed     SENSATION: Light touch: Appears intact Proprioception: Appears intact   POSTURE: No Significant postural limitations  PELVIC ALIGNMENT:  LUMBARAROM/PROM: full lumbar  ROM   LOWER EXTREMITY ROM: full bilateral hip ROM   LOWER EXTREMITY MMT: Bilateral hip strength is 4/5.    PALPATION:   General  tenderness located in the lower abdomen, decreased movement of the lower rib cage, good mobility of the diaphragm, tenderness in the buttocks                External Perineal Exam tenderness located in the bulbocavernosus, ischiocavernosus, perineal body                             Internal Pelvic Floor levator ani, obturator internist, side of the introitus, sides of the bladder  Patient confirms identification and approves PT to assess internal pelvic floor and treatment Yes  PELVIC MMT:   MMT eval 10/11/23  Vaginal 3/5 holding for 6 seconds 3/5  (Blank rows = not tested)        TONE: increased  PROLAPSE: None 12/13/23: hen patient bears down in supine anterior wall weakness to the introitus   TODAY'S TREATMENT:    12/20/23 Exercises: Stretches/mobility: Sitting piriformis stretch holding 30 sec bil.  Supine with laying on foam roll to increased mobility of the thoracic lumbar junction to reduce pain with diaphragmatic breathing Laying on side with strap to stretch the hip flexor holding 30 sec  bil.  Marjo Bicker pose with arms on foam roll to stretch the thoracic further Strengthening: Tried to lay on foam roll along the spine and lift arm but cramp in the left leg Supine lift alternate shoulder and leg with core 15 x each side, had some discomfort in the groin Bridge with bilateral shoulder flexion holding 1 # wt. 20 x Hip auction laying on her side while pressing on ball 15 x Hip abduction laying on side while bringing 1# wt over head 15 x  Standing Pallof with 30# band working the core Squatting in range she does not have discomfort with tactile cues to engage the lower abdominals    12/13/23 Manual: Internal pelvic floor techniques: No emotional/communication barriers or cognitive limitation. Patient is motivated to learn. Patient  understands and agrees with treatment goals and plan. PT explains patient will be examined in standing, sitting, and lying down to  see how their muscles and joints work. When they are ready, they will be asked to remove their underwear so PT can examine their perineum. The patient is also given the option of providing their own chaperone as one is not provided in our facility. The patient also has the right and is explained the right to defer or refuse any part of the evaluation or treatment including the internal exam. With the patient's consent, PT will use one gloved finger to gently assess the muscles of the pelvic floor, seeing how well it contracts and relaxes and if there is muscle symmetry. After, the patient will get dressed and PT and patient will discuss exam findings and plan of care. PT and patient discuss plan of care, schedule, attendance policy and HEP activities.  Going through the vaginal canal working on the obturator internist, levator ani, around the urethra and bladder.  Neuromuscular re-education: Core retraining: Transverse abdominus contraction in supine  10 x Supine heel slide with core engaged and pelvic floor contraction 10 x each Quadruped lift lower extremity 10 x each Supine bridge 20 x Supine alternate shoulder flexion with core and pelvic floor engaged 20 x Form correction: Supine with laying on foam roll to increased mobility of the thoracic lumbar junction to reduce pain with diaphragmatic breathing Down training: Diaphragmatic breathing to relax the pelvic floor as therapist finger in the vaginal canal to feel the pelvic floor muscles relax Self-care: Educated patient on what a prolapse is, how it can cause pain in the lower abdominal area, how to manage the pressure by laying on back with legs elevated    11/29/23 Manual: Soft tissue mobilization: Manual work to the hip adductors to reduce the tension Myofascial release: Fascial release around the urogenital  diaphragm going through the different layers  Internal pelvic floor techniques: No emotional/communication barriers or cognitive limitation. Patient is motivated to learn. Patient understands and agrees with treatment goals and plan. PT explains patient will be examined in standing, sitting, and lying down to see how their muscles and joints work. When they are ready, they will be asked to remove their underwear so PT can examine their perineum. The patient is also given the option of providing their own chaperone as one is not provided in our facility. The patient also has the right and is explained the right to defer or refuse any part of the evaluation or treatment including the internal exam. With the patient's consent, PT will use one gloved finger to gently assess the muscles of the pelvic floor, seeing how well it contracts and relaxes and if there is muscle symmetry. After, the patient will get dressed and PT and patient will discuss exam findings and plan of care. PT and patient discuss plan of care, schedule, attendance policy and HEP activities.  Going through the vaginal canal working on the levator ani, obturator internist, along the Alcock's canal, sides of the bladder, distraction of the bladder and uterus and urethra sphincter Neuromuscular re-education: Down training: Diaphragmatic breathing with tactile cues to the pelvic floor and abdomen to get the pelvic drop motion Therapeutic activities: Functional strengthening activities: Discussed with patient on using the vaginal wand, showed her internally where to place the wand, showed her how much pressure to use on the wand.       PATIENT EDUCATION:  12/13/23 Education details: Access Code: [email or web address], educated patient on using the vaginal wand.  Person educated: Patient Education method: Explanation, Demonstration, Tactile cues, Verbal cues, and Handouts Education comprehension:  verbalized understanding, returned demonstration, verbal  cues required, tactile cues required, and needs further education  HOME EXERCISE PROGRAM: 12/13/23 Access Code: [email or web address] URL: [email or web address] Date: 12/20/2023 Prepared by: [name]  Exercises - Seated Piriformis Stretch with Trunk Bend  - 1 x daily - 7 x weekly - 1 sets - 1 reps - 30 sec hold - Seated Hamstring Stretch  - 1 x daily - 7 x weekly - 1 sets - 1 reps - 30 sec hold - Seated Happy Baby With Trunk Flexion For Pelvic Relaxation  - 1 x daily - 7 x weekly - 1 sets - 1 reps - 30 sec hold - Cat Cow  - 1 x daily - 7 x weekly - 1 sets - 10 reps - Diaphragmatic Breathing in Child's Pose with Pelvic Floor Relaxation  - 1 x daily - 7 x weekly - 1 sets - 5 reps - 10 sec hold - Supine Diaphragmatic Breathing  - 1 x daily - 7 x weekly - 1 sets - 10 reps - Sidelying Thoracic Rotation with Open Book  - 1 x daily - 2 x weekly - 1 sets - 10 reps - Diaphragmatic Breathing with Hips Elevated (for Pelvic Organ Prolapse)  - 1 x daily - 7 x weekly - 3 sets - 10 reps - Bent Knee Fallouts  - 1 x daily - 3 x weekly - 2 sets - 10 reps - Supine Transversus Abdominis Bracing with Heel Slide  - 1 x daily - 3 x weekly - 2 sets - 10 reps - Diaphragmatic Breathing with Hips Elevated (for Pelvic Organ Prolapse)  - 2 x daily - 7 x weekly - 1 sets - 10 reps - Supine Bridge with Pelvic Floor Contraction  - 1 x daily - 3 x weekly - 1 sets - 10 reps - Quadruped Leg Lifts  - 1 x daily - 3 x weekly - 2 sets - 10 reps - Dead Bug  - 1 x daily - 3 x weekly - 2 sets - 10 reps - Sidelying Hip Adduction  - 1 x daily - 3 x weekly - 2 sets - 10 reps - Sidelying Pelvic Floor Contraction with Hip Abduction  - 1 x daily - 3 x weekly - 2 sets - 10 reps    ASSESSMENT:  CLINICAL IMPRESSION: Patient is a 43 y.o. female who was seen today for physical therapy  treatment for pelvic floor dysfunction. Patient is not having to get up at night now to urinate. Patient has done the vaginal wand 4 times since last  week. Patient has less pain when using her vaginal wand. Urinary leakage is 40% better. Patient has hip and groin soreness with her exercises. She is working on increasing her core strength to be able to do her work tasks. She will look into counseling for the fear of pain with intercourse. Patient will benefit from skilled therapy to reduce her pain to restore her function.    OBJECTIVE IMPAIRMENTS: decreased activity tolerance, decreased strength, increased fascial restrictions, increased muscle spasms, and pain.   ACTIVITY LIMITATIONS: lifting and standing  PARTICIPATION LIMITATIONS: cleaning, laundry, shopping, community activity, and occupation  PERSONAL FACTORS: Fitness are also affecting patient's functional outcome.   REHAB POTENTIAL: Excellent  CLINICAL DECISION MAKING: Stable/uncomplicated  EVALUATION COMPLEXITY: Low   GOALS: Goals reviewed with patient? Yes  SHORT TERM GOALS: Target date: 09/26/23  Patient independent with initial HEP for flexibility and diaphragmatic  breathing.  Baseline: Goal status: Met 10/04/23  2.  Patient educated on correct lifting technique to reduce strain on the pelvic floor.  Baseline:  Goal status: met 11/15/23  3.  Patient educated on perineal massage to reduce trigger points.  Baseline: educated patient on vaginal wand Goal status: Met 11/15/23   LONG TERM GOALS: Target date: 02/1024  Patient independent with advanced HEP for core and pelvic floor strength.  Baseline:  Goal status: INITIAL  2.  Patient able to lift 20# with minimal to no pain due to reduction of trigger points in the pelvic floor.  Baseline:  Goal status: INITIAL  3.  Patient reports minimal to no pain after penile penetration due to the ability to lengthen the pelvic floor.  Baseline:  Goal status: INITIAL  4.  Patient reports her lower abdominal pain decreased </= 0-1/10 with standing 1-2 hours at work due to improve strength of the core and hips.  Baseline:  pain level 6/10 Goal status: ongoing 12/20/23   PLAN:  PT FREQUENCY: 1x/week  PT DURATION: 6 months  PLANNED INTERVENTIONS: 97110-Therapeutic exercises, 97530- Therapeutic activity, 97112- Neuromuscular re-education, 97140- Manual therapy, 97014- Electrical stimulation (unattended), 97035- Ultrasound, Patient/Family education, Taping, Dry Needling, Cryotherapy, Moist heat, and Biofeedback  PLAN FOR NEXT SESSION:  internal  pelvic floor work,  core strength,  lifting, prolapse management  [name], PT 12/20/23 11:25 AM

## 2023-12-23 ENCOUNTER — Encounter: Payer: Self-pay | Admitting: Professional

## 2023-12-23 ENCOUNTER — Ambulatory Visit: Payer: 59 | Admitting: Professional

## 2023-12-23 DIAGNOSIS — F4322 Adjustment disorder with anxiety: Secondary | ICD-10-CM | POA: Diagnosis not present

## 2023-12-23 DIAGNOSIS — F331 Major depressive disorder, recurrent, moderate: Secondary | ICD-10-CM

## 2023-12-23 NOTE — Progress Notes (Addendum)
  Behavioral Health Counselor/Therapist Progress Note  Patient ID: Connie Carlson, MRN: [id number],    Prefers to go by Connie Carlson, her middle name  Date: 12/23/2023  Time Spent:  55 minutes [phone]   Treatment Type: Individual Therapy  Risk Assessment: Danger to Self:  No Self-injurious Behavior: No Danger to Others: No  Subjective: The patient arrived on time for her inperson appointment.   Issues addressed: 1-physical -she will be off until May 7th -her PT ended up completing the paperwork -sees Gyn on May 7th 2-mood -melancholy -everyday is hard to do what daily -pt spending excess time on sofa   Discussed Mental Health-Intensive Outpatient (MH-IOP) -pt would like information and links were sent to patient -she would like to think about and Clinician reminded her that she could make the call for herself 3-pt's ex-husband Connie Carlson is moving back to [place] and will be working at same location as patient -she does not want a romantic relationship with him -pt would like to have him as a friend that she can go places and do things with -she has no desire for Friends with Benefits   Treatment Plan Problems Addressed  Career Success Obstacles, Depression, Low Self-Esteem/Lack of Assertiveness, Partner Relational Problems  Goals 1. Develop a sense of own personal power and self-esteem within the relationship. 2. Develop advocacy and empowerment skills that involve taking a proactive stance in the work environment. 3. Develop assertiveness skills and techniques for application in work-related activities (e.g., establishing boundaries with coworkers, Garment/textile technologist). 4. Develop healthy cognitive mechanisms to facilitate positive attitudes and beliefs about self within the context of one's environment to mitigate depressive symptoms. Objective Articulate signs and symptoms of depression in current life experiences. Target Date: 2024-11-03 Frequency: Biweekly  Progress: 70  Modality: individual  Related Interventions Encourage the client to share her feelings of depression to gain an insight into precipitating events and implications of symptoms; normalize her feelings of depression. Objective Implement behavioral interventions to overcome depression. Target Date: 2024-11-03 Frequency: Biweekly  Progress: 30 Modality: individual  Related Interventions Assign the client to participate as fully as possible in a healthy exercise regimen. Encourage the client to continue participation in positive social support systems. Objective Identify and replace cognitive self-talk that supports depression. Target Date: 2024-11-03 Frequency: Biweekly  Progress: 30 Modality: individual  Related Interventions Reinforce the client's positive, reality-based cognitive messages that enhance self-confidence and increase adaptive action (see "Positive Self-Talk" in the Adult Psychotherapy Homework Planner, 2nd ed. by Stephannie Li). Do "behavioral experiments" in which depressive automatic thoughts are treated as hypotheses/predictions, reality-based alternative hypotheses/ predictions are generated, and both are tested against the client's past, present, and/or future experiences. Encourage the client to discuss cognitive distortions, including automatic thoughts (e.g., negative view of self, future, experience) and negative schemas (e.g., core beliefs about self and others based on earlier childhood experiences); assess frequency of negative self-statements associated with depression. Objective Increase the frequency of engaging in pleasant activities. Target Date: 2024-11-03 Frequency: Biweekly  Progress: 30 Modality: individual  Related Interventions Assign the client reading materials regarding overcoming depression for women (e.g., Women and Depression: A Practical Self-Help Guide by Allyne Gee; Feeling Good by Lawerance Bach; Women & Depression by Collier Bullock; Silencing the Self: Women and  Depression by Ree Kida). Objective Identify precipitating events and factors of depression, particularly roles of self and others in depression. Target Date: 2024-11-03 Frequency: Biweekly   Modality: individual  Related Interventions Explore the degree to which primary symptoms of depression are a result of gender role  socialization (e.g., passivity, decreased self-esteem, learned helplessness, interpersonal orientation towards pleasing others). Evaluate symptoms to consider the presence of culture-bound syndromes and/or culture specificity of symptoms (e.g., ataques de nervios, amok, postemigration effects). Objective Increase the level of physical exercise. Target Date: 2024-11-03 Frequency: Biweekly  Progress: 10 Modality: individual  Objective Increase social contacts and communicate needs within existing interpersonal relationships. Target Date: 2024-11-03 Frequency: Biweekly  Progress: 30 Modality: individual  5. Explore career options that have been automatically ruled out due to low self-concept or limited opportunities. Objective Identify and replace at least three negative self-talk statements that perpetuate feelings of powerlessness or hopelessness. Target Date: 2024-11-03 Frequency: Biweekly  Progress: 10 Modality: individual  Objective Identify and replace cognitive self-talk that contributes to all-or-nothing thinking, gender-role stereotypes, or low career self-efficacy. Target Date: 2024-11-03 Frequency: Biweekly  Progress: 10 Modality: individual  Objective Develop career-related strategies to overcome and persist in the face of obstacles. Target Date: 2024-11-03 Frequency: Biweekly  Progress: 30 Modality: individual  Objective Increase self-esteem and self-concept by exploring and encompassing other roles/identities besides those of career. Target Date: 2024-11-03 Frequency: Biweekly  Progress: 20 Modality: individual  Objective Identify barriers and personal  benefits to balancing career and family life. Target Date: 2024-11-03 Frequency: Biweekly  Progress: 60 Modality: individual  6. Identify and increase intrapersonal, interpersonal, and physical resources to foster positive coping strategies. 7. Improve depressed mood to maximize effective social, occupational, and physical functioning. 8. Improve self-esteem and develop a positive self-image as capable and competent. 9. Increase ability to express needs and desires openly and honestly. 10. Increase assertiveness skills and ability to advocate for self. 11. Increase awareness of own role in relationship conflicts. Objective Identify a pattern of repeatedly forming destructive intimate relationships. Target Date: 2024-11-03 Frequency: Biweekly  Progress: 60 Modality: individual  12. Increase involvement in activities that foster confidence and a sense of accomplishment. Objective Acknowledge self-disparaging statements and recognize the tendency to engage in such statements. Target Date: 2024-11-03 Frequency: Biweekly  Progress: 60 Modality: individual  Related Interventions Ask the client to complete and process an exercise in the book Ten Days to Self-Esteem! Lawerance Bach). Objective Verbalize an understanding of the differences between passive, assertive, and aggressive behavior. Target Date: 2024-11-03 Frequency: Biweekly  Progress: 50 Modality: individual  Objective Describe personal history of self-devaluation and lack of assertiveness. Target Date: 2024-11-03 Frequency: Biweekly  Progress: 60 Modality: individual  Related Interventions Use cognitive-restructuring techniques to change the client's belief system (i.e., via reality-testing and rational thought) toward a more positive, realistic self-perception. Assist the client in becoming aware of how she indirectly expresses negative feelings about self (e.g., lack of eye contact, social withdrawal, sweating, expectations of failure or  rejection). Assist the client in identifying negative beliefs about herself. Discuss context of self-disparagement, including client's perceptions of self from childhood to present, role of caregivers and peers, and related experiences. Objective Identify three roadblocks to improved self-esteem. Target Date: 2024-11-03 Frequency: Biweekly  Progress: 0 Modality: individual  Objective   Diagnosis:Major depressive disorder, recurrent episode, moderate (HCC)  Adjustment disorder with anxiety  Plan: -get off the couch and get busy living -meet again on Friday, Jan 20, 2024 at 8am in person

## 2023-12-27 ENCOUNTER — Other Ambulatory Visit (HOSPITAL_COMMUNITY): Payer: Self-pay

## 2023-12-27 ENCOUNTER — Telehealth: Payer: Self-pay

## 2023-12-27 NOTE — Telephone Encounter (Signed)
 Pharmacy Patient Advocate Encounter   Received notification from Patient Pharmacy that prior authorization for Emgality is required/requested.   Insurance verification completed.   The patient is insured through [employer] .   Per test claim: PA required; PA submitted to above mentioned insurance via CoverMyMeds Key/confirmation #/EOC [id number] Status is pending

## 2023-12-30 ENCOUNTER — Ambulatory Visit: Payer: Self-pay | Admitting: Physical Therapy

## 2023-12-30 ENCOUNTER — Other Ambulatory Visit (HOSPITAL_COMMUNITY): Payer: Self-pay

## 2023-12-30 ENCOUNTER — Telehealth: Payer: Self-pay | Admitting: Physical Therapy

## 2023-12-30 NOTE — Telephone Encounter (Signed)
 Pharmacy Patient Advocate Encounter  Received notification from [facility] that Prior Authorization for Emgality 120 md/mL has been APPROVED from 12/29/23 to [employer]. Ran test claim, Copay is $35.00. This test claim was processed through [facility]- copay amounts may vary at other pharmacies due to pharmacy/plan contracts, or as the patient moves through the different stages of their insurance plan.   PA #/Case ID/Reference #: [id number]

## 2023-12-30 NOTE — Telephone Encounter (Signed)
 Called patient about her missed appointment today at 8:00. Left a message.  [name], PT @4 /11/25@ 8:21 AM

## 2024-01-10 ENCOUNTER — Encounter: Payer: Self-pay | Admitting: Nurse Practitioner

## 2024-01-10 ENCOUNTER — Ambulatory Visit: Admitting: Nurse Practitioner

## 2024-01-10 VITALS — BP 133/81 | HR 77 | Temp 98.1°F | Ht 61.0 in | Wt 233.0 lb

## 2024-01-10 DIAGNOSIS — I10 Essential (primary) hypertension: Secondary | ICD-10-CM | POA: Diagnosis not present

## 2024-01-10 DIAGNOSIS — Z6841 Body Mass Index (BMI) 40.0 and over, adult: Secondary | ICD-10-CM

## 2024-01-10 DIAGNOSIS — E66813 Obesity, class 3: Secondary | ICD-10-CM

## 2024-01-10 MED ORDER — TIRZEPATIDE-WEIGHT MANAGEMENT 5 MG/0.5ML ~~LOC~~ SOLN: 5.0000 mg | SUBCUTANEOUS | 0 refills | Status: DC

## 2024-01-10 NOTE — Progress Notes (Signed)
 Office: [phone]  /  Fax: [phone]  WEIGHT SUMMARY AND BIOMETRICS  Weight Lost Since Last Visit: 0lb  Weight Gained Since Last Visit: 3lb   Vitals Temp: 98.1 F (36.7 C) BP: 133/81 Pulse Rate: 77 SpO2: 99 %   Anthropometric Measurements Height: 5\' 1"  (1.549 m) Weight: 233 lb (105.7 kg) BMI (Calculated): 44.05 Weight at Last Visit: 230lb Weight Lost Since Last Visit: 0lb Weight Gained Since Last Visit: 3lb Starting Weight: 198lb Total Weight Loss (lbs): 0 lb (0 kg)   Body Composition  Body Fat %: 48.4 % Fat Mass (lbs): 113 lbs Muscle Mass (lbs): 114.4 lbs Total Body Water (lbs): 81.4 lbs Visceral Fat Rating : 15   Other Clinical Data Fasting: No Labs: No Today's Visit #: 6 Starting Date: 03/30/23     HPI  Chief Complaint: OBESITY  Makenna is here to discuss her progress with her obesity treatment plan. She is on the the Category 2 Plan and states she is following her eating plan approximately 20 % of the time. She states she is exercising 20 minutes 2 days per week.   Interval History:  Since last office visit she has gained 3 pounds.  Her uncle passed away and she recently went to [place] for the funeral.  She notes she got off track while in [place].  She is going to PT once per week.  She is using [facility] but forgot her phone today so I couldn't review her macros with her.  She is drinking water daily.  She did drink a lot of sodas while in [place].    Notes fatigue.  Has appt with PCP this week and plans to discuss with PCP.   No upcoming celebrations or vacations.   Pharmacotherapy for weight loss: She is currently taking Zepbound 2.5mg  for medical weight loss (x 4 doses).  Denies side effects.    Previous pharmacotherapy for medical weight loss:   Phentermine -stopped due to side effects-felt terrible while taking it Topamax -stopped due to side effects of insomnia   Avoid Qsymia  due to side effects to Phentermine  and Topamax .     Bariatric surgery:  Patient has not had bariatric surgery.   Hypertension Hypertension is stable.  Medication(s): Diovan  HCT 160-12.5mg . Denies side effects.  Denies chest pain, palpitations and SOB.  BP Readings from Last 3 Encounters:  01/10/24 133/81  11/21/23 134/81  10/12/23 (!) 149/92   Lab Results  Component Value Date   CREATININE 0.87 03/30/2023   CREATININE 0.78 02/15/2023   CREATININE 0.87 08/25/2022      PHYSICAL EXAM:  Blood pressure 133/81, pulse 77, temperature 98.1 F (36.7 C), height 5\' 1"  (1.549 m), weight 233 lb (105.7 kg), last menstrual period 10/22/2023, SpO2 99%. Body mass index is 44.02 kg/m.  General: She is overweight, cooperative, alert, well developed, and in no acute distress. PSYCH: Has normal mood, affect and thought process.   Extremities: No edema.  Neurologic: No gross sensory or motor deficits. No tremors or fasciculations noted.    DIAGNOSTIC DATA REVIEWED:  BMET    Component Value Date/Time   NA 139 03/30/2023 0902   K 4.7 03/30/2023 0902   CL 104 03/30/2023 0902   CO2 22 03/30/2023 0902   GLUCOSE 88 03/30/2023 0902   GLUCOSE 96 02/15/2023 0826   BUN 14 03/30/2023 0902   CREATININE 0.87 03/30/2023 0902   CREATININE 0.78 02/15/2023 0826   CALCIUM 9.5 03/30/2023 0902   GFRNONAA 103 10/17/2020 0000   GFRAA 119 10/17/2020 0000  Lab Results  Component Value Date   HGBA1C 5.5 03/30/2023   HGBA1C 5.8 (H) 10/08/2013   Lab Results  Component Value Date   INSULIN  12.4 03/30/2023   Lab Results  Component Value Date   TSH 1.270 03/30/2023   CBC    Component Value Date/Time   WBC 7.7 08/25/2022 1411   RBC 4.07 08/25/2022 1411   HGB 12.2 08/25/2022 1411   HCT 37.1 08/25/2022 1411   PLT 290 08/25/2022 1411   MCV 91.2 08/25/2022 1411   MCH 30.0 08/25/2022 1411   MCHC 32.9 08/25/2022 1411   RDW 12.8 08/25/2022 1411   Iron Studies    Component Value Date/Time   IRON 42 05/08/2015 1632   TIBC 480 (H) 05/08/2015 1632    IRONPCTSAT 9 (L) 05/08/2015 1632   Lipid Panel     Component Value Date/Time   CHOL 150 03/30/2023 0902   TRIG 93 03/30/2023 0902   HDL 53 03/30/2023 0902   CHOLHDL 2.8 02/02/2022 0000   VLDL 19 05/06/2015 0954   LDLCALC 80 03/30/2023 0902   LDLCALC 74 02/02/2022 0000   Hepatic Function Panel     Component Value Date/Time   PROT 6.9 03/30/2023 0902   ALBUMIN 4.1 03/30/2023 0902   AST 17 03/30/2023 0902   ALT 16 03/30/2023 0902   ALKPHOS 50 03/30/2023 0902   BILITOT 0.2 03/30/2023 0902      Component Value Date/Time   TSH 1.270 03/30/2023 0902   Nutritional Lab Results  Component Value Date   VD25OH 41.9 03/30/2023   VD25OH 26 (L) 01/12/2012     ASSESSMENT AND PLAN  TREATMENT PLAN FOR OBESITY:  Recommended Dietary Goals  Jola is currently in the action stage of change. As such, her goal is to continue weight management plan. She has agreed to the Category 2 Plan.  Behavioral Intervention  We discussed the following Behavioral Modification Strategies today: increasing lean protein intake to established goals, decreasing simple carbohydrates , increasing vegetables, increasing fiber rich foods, increasing water intake , and continue to work on maintaining a reduced calorie state, getting the recommended amount of protein, incorporating whole foods, making healthy choices, staying well hydrated and practicing mindfulness when eating..  Additional resources provided today: NA  Recommended Physical Activity Goals  Clodagh has been advised to work up to 150 minutes of moderate intensity aerobic activity a week and strengthening exercises 2-3 times per week for cardiovascular health, weight loss maintenance and preservation of muscle mass.   She has agreed to exercises per PT recommendations   Pharmacotherapy We discussed various medication options to help Karolee with her weight loss efforts and we both agreed to increase Zepbound 5mg . Side effects discussed.  Avoid  Qsymia  due to side effects to Phentermine  and Topamax .   ASSOCIATED CONDITIONS ADDRESSED TODAY  Action/Plan  Primary hypertension Continue to follow up with PCP.  Continue meds as directed.   Class 3 severe obesity due to excess calories with serious comorbidity and body mass index (BMI) of 40.0 to 44.9 in adult [facility]) -     Tirzepatide-Weight Management; Inject 5 mg into the skin once a week.  Dispense: 2 mL; Refill: 0      Goals: Stop sugary drinks Increase protein intake Decrease carbs Increase water  She is seeing her PCP this week for follow up and plans to request labs for fatigue.     Return in about 4 weeks (around 02/07/2024).Aaron Aas She was informed of the importance of frequent follow up visits  to maximize her success with intensive lifestyle modifications for her multiple health conditions.   ATTESTASTION STATEMENTS:  Reviewed by clinician on day of visit: allergies, medications, problem list, medical history, surgical history, family history, social history, and previous encounter notes.     Crist Dominion. Chiniqua Kilcrease FNP-C

## 2024-01-12 ENCOUNTER — Encounter: Admitting: Physical Therapy

## 2024-01-12 ENCOUNTER — Other Ambulatory Visit (HOSPITAL_COMMUNITY): Payer: Self-pay

## 2024-01-12 ENCOUNTER — Telehealth: Payer: Self-pay

## 2024-01-12 ENCOUNTER — Ambulatory Visit: Admitting: Family Medicine

## 2024-01-12 ENCOUNTER — Encounter: Payer: Self-pay | Admitting: Family Medicine

## 2024-01-12 ENCOUNTER — Encounter: Payer: Self-pay | Admitting: Physical Therapy

## 2024-01-12 VITALS — BP 134/71 | HR 70 | Ht 61.0 in | Wt 237.0 lb

## 2024-01-12 DIAGNOSIS — I10 Essential (primary) hypertension: Secondary | ICD-10-CM | POA: Diagnosis not present

## 2024-01-12 DIAGNOSIS — R102 Pelvic and perineal pain: Secondary | ICD-10-CM

## 2024-01-12 DIAGNOSIS — Z6841 Body Mass Index (BMI) 40.0 and over, adult: Secondary | ICD-10-CM

## 2024-01-12 DIAGNOSIS — R252 Cramp and spasm: Secondary | ICD-10-CM | POA: Diagnosis not present

## 2024-01-12 DIAGNOSIS — G43009 Migraine without aura, not intractable, without status migrainosus: Secondary | ICD-10-CM

## 2024-01-12 DIAGNOSIS — E66813 Obesity, class 3: Secondary | ICD-10-CM

## 2024-01-12 DIAGNOSIS — F339 Major depressive disorder, recurrent, unspecified: Secondary | ICD-10-CM

## 2024-01-12 DIAGNOSIS — M6281 Muscle weakness (generalized): Secondary | ICD-10-CM

## 2024-01-12 DIAGNOSIS — F330 Major depressive disorder, recurrent, mild: Secondary | ICD-10-CM

## 2024-01-12 DIAGNOSIS — R1030 Lower abdominal pain, unspecified: Secondary | ICD-10-CM

## 2024-01-12 MED ORDER — UBRELVY 50 MG PO TABS: 50.0000 mg | ORAL_TABLET | Freq: Every day | ORAL | 11 refills | Status: DC | PRN

## 2024-01-12 MED ORDER — DULOXETINE HCL 60 MG PO CPEP: 60.0000 mg | ORAL_CAPSULE | Freq: Every day | ORAL | 2 refills | Status: DC

## 2024-01-12 MED ORDER — VALSARTAN-HYDROCHLOROTHIAZIDE 160-12.5 MG PO TABS: 1.0000 | ORAL_TABLET | Freq: Every day | ORAL | 1 refills | Status: DC

## 2024-01-12 MED ORDER — TOPIRAMATE 50 MG PO TABS: 50.0000 mg | ORAL_TABLET | Freq: Two times a day (BID) | ORAL | 1 refills | Status: DC

## 2024-01-12 NOTE — Progress Notes (Signed)
 Established Patient Office Visit  Subjective  Patient ID: Connie Carlson, female    DOB: 1980/11/08  Age: 43 y.o. MRN: [id number]  Chief Complaint  Patient presents with   Medical Management of Chronic Issues    HPI   68-month follow-up for migraines and mood.  He has been engaged with a therapist, Devera Fluke.  She feels like the therapy has actually been really helpful.  Socially when she starts to feel more anxious she has been employing her techniques.  She does feel like she is resting better she has had days where she is feeling less tearful.  Just working on trying to still reduce her stress levels.  The thing that she struggling the most with right now still not having a lot of energy.  Tried a friends Cathyann Cobia and it worked really well when she had a migraine. She has tried several tryptans in the past.     ROS    Objective:     BP 134/71   Pulse 70   Ht 5\' 1"  (1.549 m)   Wt 237 lb (107.5 kg)   LMP 10/22/2023 (Approximate)   SpO2 99%   BMI 44.78 kg/m    Physical Exam Vitals and nursing note reviewed.  Constitutional:      Appearance: Normal appearance.  HENT:     Head: Normocephalic and atraumatic.  Eyes:     Conjunctiva/sclera: Conjunctivae normal.  Cardiovascular:     Rate and Rhythm: Normal rate and regular rhythm.  Pulmonary:     Effort: Pulmonary effort is normal.     Breath sounds: Normal breath sounds.  Skin:    General: Skin is warm and dry.  Neurological:     Mental Status: She is alert.  Psychiatric:        Mood and Affect: Mood normal.      No results found for any visits on 01/12/24.    The 10-year ASCVD risk score (Arnett DK, et al., 2019) is: 0.7%    Assessment & Plan:   Problem List Items Addressed This Visit       Cardiovascular and Mediastinum   Migraine headache without aura - Primary   We discussed options including that we could always go up on her Topamax  that could be helpful as well but I know she has had leg cramps  with Topamax  before.  And when I look at her fill history it looks like it was only filled once since August.  So I am not 100% sure how consistently she is taking it so I am hesitant to go up.  Will send a prescription for you roughly for her to take as a trial to see if it is helpful and more effective than some of the triptans she has used in the past.      Relevant Medications   DULoxetine  (CYMBALTA ) 60 MG capsule   Ubrogepant  (UBRELVY ) 50 MG TABS   topiramate  (TOPAMAX ) 50 MG tablet   valsartan -hydrochlorothiazide  (DIOVAN -HCT) 160-12.5 MG tablet   Hypertension   BP ok today.        Relevant Medications   valsartan -hydrochlorothiazide  (DIOVAN -HCT) 160-12.5 MG tablet     Other   Major depressive disorder, recurrent episode, mild (HCC)   Relevant Medications   DULoxetine  (CYMBALTA ) 60 MG capsule   Depression, recurrent (HCC)   He is really been happy with the progress that she has made so far.  We did discuss maybe bumping up the Cymbalta  to 60 mg since she still  struggling with some of the anxiety symptoms.     01/12/2024    8:24 AM 08/30/2023    9:14 AM 05/27/2023    8:17 AM 03/14/2023    3:43 PM 02/08/2023   10:32 AM  Depression screen PHQ 2/9  Decreased Interest 2 2  2 1   Down, Depressed, Hopeless 1 1  2 1   PHQ - 2 Score 3 3  4 2   Altered sleeping 0 0  1 2  Tired, decreased energy 1 1  1 2   Change in appetite 1 2  1  0  Feeling bad or failure about yourself  1 1  2 1   Trouble concentrating 0 1  1 0  Moving slowly or fidgety/restless 0 1  0 0  Suicidal thoughts 0 0  0 0  PHQ-9 Score 6 9  10 7   Difficult doing work/chores  Somewhat difficult  Somewhat difficult Somewhat difficult     Information is confidential and restricted. Go to Review Flowsheets to unlock data.  -    01/12/2024    8:25 AM 08/30/2023    9:14 AM 03/14/2023    3:44 PM 02/08/2023   10:33 AM  GAD 7 : Generalized Anxiety Score  Nervous, Anxious, on Edge 1 1 2 1   Control/stop worrying 0 1 2 1   Worry too  much - different things 1 1 2 1   Trouble relaxing 1 2 1 1   Restless 0 2 1 1   Easily annoyed or irritable 0 2 2 1   Afraid - awful might happen 2 1 2  0  Total GAD 7 Score 5 10 12 6   Anxiety Difficulty Somewhat difficult Somewhat difficult Somewhat difficult Somewhat difficult          Relevant Medications   DULoxetine  (CYMBALTA ) 60 MG capsule   Class 3 severe obesity with serious comorbidity and body mass index (BMI) of 40.0 to 44.9 in adult [facility])   He is working with Trevor Fudge took her off on weight management.  They recently started tirzepatide.  She tolerated the 2.5 mg without any problems and will be bumping up to 5 mg.       Return in about 3 months (around 04/12/2024) for Mood.    Duaine German, MD

## 2024-01-12 NOTE — Assessment & Plan Note (Addendum)
 We discussed options including that we could always go up on her Topamax  that could be helpful as well but I know she has had leg cramps with Topamax  before.  And when I look at her fill history it looks like it was only filled once since August.  So I am not 100% sure how consistently she is taking it so I am hesitant to go up.  Will send a prescription for you roughly for her to take as a trial to see if it is helpful and more effective than some of the triptans she has used in the past.

## 2024-01-12 NOTE — Telephone Encounter (Signed)
 Pharmacy Patient Advocate Encounter   Received notification from Patient Pharmacy that prior authorization for Ubrelvy  50 is required/requested.   Insurance verification completed.   The patient is insured through [employer] .   Per test claim: PA required; PA submitted to above mentioned insurance via CoverMyMeds Key/confirmation #/EOC [id number] Status is pending

## 2024-01-12 NOTE — Assessment & Plan Note (Signed)
 He is working with Trevor Fudge took her off on [employer].  They recently started tirzepatide.  She tolerated the 2.5 mg without any problems and will be bumping up to 5 mg.

## 2024-01-12 NOTE — Assessment & Plan Note (Signed)
BP ok today

## 2024-01-12 NOTE — Assessment & Plan Note (Signed)
 He is really been happy with the progress that she has made so far.  We did discuss maybe bumping up the Cymbalta  to 60 mg since she still struggling with some of the anxiety symptoms.     01/12/2024    8:24 AM 08/30/2023    9:14 AM 05/27/2023    8:17 AM 03/14/2023    3:43 PM 02/08/2023   10:32 AM  Depression screen PHQ 2/9  Decreased Interest 2 2  2 1   Down, Depressed, Hopeless 1 1  2 1   PHQ - 2 Score 3 3  4 2   Altered sleeping 0 0  1 2  Tired, decreased energy 1 1  1 2   Change in appetite 1 2  1  0  Feeling bad or failure about yourself  1 1  2 1   Trouble concentrating 0 1  1 0  Moving slowly or fidgety/restless 0 1  0 0  Suicidal thoughts 0 0  0 0  PHQ-9 Score 6 9  10 7   Difficult doing work/chores  Somewhat difficult  Somewhat difficult Somewhat difficult     Information is confidential and restricted. Go to Review Flowsheets to unlock data.  -    01/12/2024    8:25 AM 08/30/2023    9:14 AM 03/14/2023    3:44 PM 02/08/2023   10:33 AM  GAD 7 : Generalized Anxiety Score  Nervous, Anxious, on Edge 1 1 2 1   Control/stop worrying 0 1 2 1   Worry too much - different things 1 1 2 1   Trouble relaxing 1 2 1 1   Restless 0 2 1 1   Easily annoyed or irritable 0 2 2 1   Afraid - awful might happen 2 1 2  0  Total GAD 7 Score 5 10 12 6   Anxiety Difficulty Somewhat difficult Somewhat difficult Somewhat difficult Somewhat difficult

## 2024-01-12 NOTE — Patient Instructions (Signed)

## 2024-01-12 NOTE — Therapy (Signed)
 OUTPATIENT PHYSICAL THERAPY FEMALE PELVIC TREATMENT   Patient Name: Connie Carlson MRN: [id number] DOB:Nov 01, 1980, 43 y.o., female Today's Date: 01/12/2024  END OF SESSION:  PT End of Session - 01/12/24 1132     Visit Number 12    Date for PT Re-Evaluation 02/28/24    Authorization Type [employer]    PT Start Time 1130    PT Stop Time 1215    PT Time Calculation (min) 45 min    Activity Tolerance Patient tolerated treatment well    Behavior During Therapy [facility] for tasks assessed/performed             Past Medical History:  Diagnosis Date   High blood pressure    Past Surgical History:  Procedure Laterality Date   CHOLECYSTECTOMY     mole remove     under right breast by [facility] - skin cancer   Patient Active Problem List   Diagnosis Date Noted   Other insomnia 03/30/2023   Generalized obesity 03/30/2023   BMI 37.0-37.9, adult 03/30/2023   Elevated glucose 03/30/2023   Vitamin D  deficiency 03/30/2023   Health care maintenance 03/30/2023   SOB (shortness of breath) on exertion 03/30/2023   Other fatigue 03/30/2023   Major depressive disorder, recurrent episode, mild (HCC) 08/26/2022   Stress and adjustment reaction 06/29/2022   Anal fissure 01/13/2022   Depression, recurrent (HCC) 04/07/2021   Hypertension 06/11/2020   Class 3 severe obesity with serious comorbidity and body mass index (BMI) of 40.0 to 44.9 in adult (HCC) 06/11/2020   IFG (impaired fasting glucose) 10/08/2013   Migraine headache without aura 02/16/2013   WEIGHT GAIN 01/01/2009   Allergic rhinitis 06/28/2006   GASTROESOPHAGEAL REFLUX, NO ESOPHAGITIS 06/28/2006    PCP: Cydney Draft, MD  REFERRING PROVIDER: Izell Marsh, MD   REFERRING DIAG: M62.89 (ICD-10-CM) - Pelvic floor dysfunction   THERAPY DIAG:  Cramp and spasm  Pelvic pain  Muscle weakness (generalized)  Lower abdominal pain  Rationale for Evaluation and Treatment: Rehabilitation  ONSET DATE:  1/23  SUBJECTIVE:                                                                                                                                                                                           SUBJECTIVE STATEMENT:  I am starting to feel better. I am not going to the bathroom at night. When patient feels the pressure she will lay down and elevate her legs.   PAIN:  Are you having pain? Yes NPRS scale: 7/10; 11/22/23 12/13/23 5/10 12/20/23: pain level is 5/10 01/12/24: 7/10 after cleaning in house and after doing the  wand Pain location:  lower abdominal area  Pain type: pressure, like something is going to come out of the vagina Pain description: intermittent   Aggravating factors: lifting heavy items, standing several hours, carrying and bending over Relieving factors: Advil  PRECAUTIONS: None  RED FLAGS: None   WEIGHT BEARING RESTRICTIONS: No  FALLS:  Has patient fallen in last 6 months? No  LIVING ENVIRONMENT: Lives with: lives with their family  OCCUPATION: works at [employer] with a lot of lifting.   PLOF: Independent  PATIENT GOALS: reduce pain  PERTINENT HISTORY:  Cholecystectomy Sexual abuse: No  BOWEL MOVEMENT: no issues  URINATION: Pain with urination: No Fully empty bladder: Yes:   Stream: Strong Urgency: No Frequency: average Leakage: Coughing, sneeze, laughing 12/13/23: leakage 30 % better.  12/20/23: urinary leakage is 40% better 01/12/24: leak with sneeze, no leakage with laughing Pads: No  INTERCOURSE: Pain with intercourse: After Intercourse, 10/10 Ability to have vaginal penetration:  Yes:   Climax: not when having pain Marinoff Scale: 2/3  PREGNANCY: Vaginal deliveries 1 Tearing No  OBJECTIVE:  Note: Objective measures were completed at Evaluation unless otherwise noted.  DIAGNOSTIC FINDINGS:  Ultrasound negative; biopsy is negative   COGNITION: Overall cognitive status: Within functional limits for tasks  assessed     SENSATION: Light touch: Appears intact Proprioception: Appears intact   POSTURE: No Significant postural limitations  PELVIC ALIGNMENT:  LUMBARAROM/PROM: full lumbar ROM   LOWER EXTREMITY ROM: full bilateral hip ROM   LOWER EXTREMITY MMT: Bilateral hip strength is 4/5.    PALPATION:   General  tenderness located in the lower abdomen, decreased movement of the lower rib cage, good mobility of the diaphragm, tenderness in the buttocks                External Perineal Exam tenderness located in the bulbocavernosus, ischiocavernosus, perineal body                             Internal Pelvic Floor levator ani, obturator internist, side of the introitus, sides of the bladder  Patient confirms identification and approves PT to assess internal pelvic floor and treatment Yes  PELVIC MMT:   MMT eval 10/11/23 01/12/24  Vaginal 3/5 holding for 6 seconds 3/5 3/5 with difficulty recruiting  (Blank rows = not tested)        TONE: increased  PROLAPSE: None 12/13/23: hen patient bears down in supine anterior wall weakness to the introitus   TODAY'S TREATMENT:    01/12/24 Manual: Internal pelvic floor techniques: No emotional/communication barriers or cognitive limitation. Patient is motivated to learn. Patient understands and agrees with treatment goals and plan. PT explains patient will be examined in standing, sitting, and lying down to see how their muscles and joints work. When they are ready, they will be asked to remove their underwear so PT can examine their perineum. The patient is also given the option of providing their own chaperone as one is not provided in our facility. The patient also has the right and is explained the right to defer or refuse any part of the evaluation or treatment including the internal exam. With the patient's consent, PT will use one gloved finger to gently assess the muscles of the pelvic floor, seeing how well it contracts and relaxes and  if there is muscle symmetry. After, the patient will get dressed and PT and patient will discuss exam findings and plan of care. PT and patient  discuss plan of care, schedule, attendance policy and HEP activities.  Going through the vaginal canal working on the perineal body, levator ani with one finger internally and other externally going slowly and monitoring for pain. Patient was laying on her right side.   Exercises: Strengthening: Hip adduction with core engagement 15 times each side Supine lift alternate shoulder and leg with core 15 x each side, had some discomfort in the groin Bridge with bilateral shoulder flexion holding 1 # wt. 20 x Standing marching with pelvic floor contraction.     12/20/23 Exercises: Stretches/mobility: Sitting piriformis stretch holding 30 sec bil.  Supine with laying on foam roll to increased mobility of the thoracic lumbar junction to reduce pain with diaphragmatic breathing Laying on side with strap to stretch the hip flexor holding 30 sec  bil.  Earline Glenn pose with arms on foam roll to stretch the thoracic further Strengthening: Tried to lay on foam roll along the spine and lift arm but cramp in the left leg Supine lift alternate shoulder and leg with core 15 x each side, had some discomfort in the groin Bridge with bilateral shoulder flexion holding 1 # wt. 20 x Hip auction laying on her side while pressing on ball 15 x Hip abduction laying on side while bringing 1# wt over head 15 x  Standing Pallof with 30# band working the core Squatting in range she does not have discomfort with tactile cues to engage the lower abdominals    12/13/23 Manual: Internal pelvic floor techniques: No emotional/communication barriers or cognitive limitation. Patient is motivated to learn. Patient understands and agrees with treatment goals and plan. PT explains patient will be examined in standing, sitting, and lying down to see how their muscles and joints work. When they  are ready, they will be asked to remove their underwear so PT can examine their perineum. The patient is also given the option of providing their own chaperone as one is not provided in our facility. The patient also has the right and is explained the right to defer or refuse any part of the evaluation or treatment including the internal exam. With the patient's consent, PT will use one gloved finger to gently assess the muscles of the pelvic floor, seeing how well it contracts and relaxes and if there is muscle symmetry. After, the patient will get dressed and PT and patient will discuss exam findings and plan of care. PT and patient discuss plan of care, schedule, attendance policy and HEP activities.  Going through the vaginal canal working on the obturator internist, levator ani, around the urethra and bladder.  Neuromuscular re-education: Core retraining: Transverse abdominus contraction in supine  10 x Supine heel slide with core engaged and pelvic floor contraction 10 x each Quadruped lift lower extremity 10 x each Supine bridge 20 x Supine alternate shoulder flexion with core and pelvic floor engaged 20 x Form correction: Supine with laying on foam roll to increased mobility of the thoracic lumbar junction to reduce pain with diaphragmatic breathing Down training: Diaphragmatic breathing to relax the pelvic floor as therapist finger in the vaginal canal to feel the pelvic floor muscles relax Self-care: Educated patient on what a prolapse is, how it can cause pain in the lower abdominal area, how to manage the pressure by laying on back with legs elevated       PATIENT EDUCATION:  01/12/24 Education details: Access Code: [email or web address], educated patient on dry needling.  Person educated: Patient Education method: Explanation, Demonstration, Tactile  cues, Verbal cues, and Handouts Education comprehension: verbalized understanding, returned demonstration, verbal cues required, tactile cues  required, and needs further education  HOME EXERCISE PROGRAM: 12/13/23 Access Code: [email or web address] URL: [email or web address] Date: 12/20/2023 Prepared by: Marsha Skeen  Exercises - Seated Piriformis Stretch with Trunk Bend  - 1 x daily - 7 x weekly - 1 sets - 1 reps - 30 sec hold - Seated Hamstring Stretch  - 1 x daily - 7 x weekly - 1 sets - 1 reps - 30 sec hold - Seated Happy Baby With Trunk Flexion For Pelvic Relaxation  - 1 x daily - 7 x weekly - 1 sets - 1 reps - 30 sec hold - Cat Cow  - 1 x daily - 7 x weekly - 1 sets - 10 reps - Diaphragmatic Breathing in Child's Pose with Pelvic Floor Relaxation  - 1 x daily - 7 x weekly - 1 sets - 5 reps - 10 sec hold - Supine Diaphragmatic Breathing  - 1 x daily - 7 x weekly - 1 sets - 10 reps - Sidelying Thoracic Rotation with Open Book  - 1 x daily - 2 x weekly - 1 sets - 10 reps - Diaphragmatic Breathing with Hips Elevated (for Pelvic Organ Prolapse)  - 1 x daily - 7 x weekly - 3 sets - 10 reps - Bent Knee Fallouts  - 1 x daily - 3 x weekly - 2 sets - 10 reps - Supine Transversus Abdominis Bracing with Heel Slide  - 1 x daily - 3 x weekly - 2 sets - 10 reps - Diaphragmatic Breathing with Hips Elevated (for Pelvic Organ Prolapse)  - 2 x daily - 7 x weekly - 1 sets - 10 reps - Supine Bridge with Pelvic Floor Contraction  - 1 x daily - 3 x weekly - 1 sets - 10 reps - Quadruped Leg Lifts  - 1 x daily - 3 x weekly - 2 sets - 10 reps - Dead Bug  - 1 x daily - 3 x weekly - 2 sets - 10 reps - Sidelying Hip Adduction  - 1 x daily - 3 x weekly - 2 sets - 10 reps - Sidelying Pelvic Floor Contraction with Hip Abduction  - 1 x daily - 3 x weekly - 2 sets - 10 reps    ASSESSMENT:  CLINICAL IMPRESSION: Patient is a 43 y.o. female who was seen today for physical therapy  treatment for pelvic floor dysfunction. Patient is not having to get up at night now to urinate. Urinary leakage is 40% better.Patient is having difficulty with relaxing her  pelvic floor. She is using the wand at home.  Patient may need dry needling to the pelvic floor muscles to further elongate them. She will look into counseling for the fear of pain with intercourse. Patient is sore in the lower abdominal and pelvic floor area after the manual work. Patient will benefit from skilled therapy to reduce her pain to restore her function.    OBJECTIVE IMPAIRMENTS: decreased activity tolerance, decreased strength, increased fascial restrictions, increased muscle spasms, and pain.   ACTIVITY LIMITATIONS: lifting and standing  PARTICIPATION LIMITATIONS: cleaning, laundry, shopping, community activity, and occupation  PERSONAL FACTORS: Fitness are also affecting patient's functional outcome.   REHAB POTENTIAL: Excellent  CLINICAL DECISION MAKING: Stable/uncomplicated  EVALUATION COMPLEXITY: Low   GOALS: Goals reviewed with patient? Yes  SHORT TERM GOALS: Target date: 09/26/23  Patient independent with initial HEP for flexibility and diaphragmatic  breathing.  Baseline: Goal status: Met 10/04/23  2.  Patient educated on correct lifting technique to reduce strain on the pelvic floor.  Baseline:  Goal status: met 11/15/23  3.  Patient educated on perineal massage to reduce trigger points.  Baseline: educated patient on vaginal wand Goal status: Met 11/15/23   LONG TERM GOALS: Target date: 02/1024  Patient independent with advanced HEP for core and pelvic floor strength.  Baseline:  Goal status: INITIAL  2.  Patient able to lift 20# with minimal to no pain due to reduction of trigger points in the pelvic floor.  Baseline:  Goal status: INITIAL  3.  Patient reports minimal to no pain after penile penetration due to the ability to lengthen the pelvic floor.  Baseline:  Goal status: INITIAL  4.  Patient reports her lower abdominal pain decreased </= 0-1/10 with standing 1-2 hours at work due to improve strength of the core and hips.  Baseline: pain level  6/10 Goal status: ongoing 12/20/23   PLAN:  PT FREQUENCY: 1x/week  PT DURATION: 6 months  PLANNED INTERVENTIONS: 97110-Therapeutic exercises, 97530- Therapeutic activity, 97112- Neuromuscular re-education, 97140- Manual therapy, 97014- Electrical stimulation (unattended), 97035- Ultrasound, Patient/Family education, Taping, Dry Needling, Cryotherapy, Moist heat, and Biofeedback  PLAN FOR NEXT SESSION:  internal  pelvic floor work, dry needling of pelvic floor core strength,  lifting, prolapse management  Marsha Skeen, PT 01/12/24 4:58 PM

## 2024-01-12 NOTE — Telephone Encounter (Signed)
 Pharmacy Patient Advocate Encounter  Received notification from [facility] that Prior Authorization for Ubrelvy  50 has been APPROVED from 01/12/24 to 09/19/24. Ran test claim, Copay is $0.00. This test claim was processed through [facility]- copay amounts may vary at other pharmacies due to pharmacy/plan contracts, or as the patient moves through the different stages of their insurance plan.   PA #/Case ID/Reference #: [id number]

## 2024-01-16 ENCOUNTER — Other Ambulatory Visit (HOSPITAL_COMMUNITY): Payer: Self-pay

## 2024-01-18 ENCOUNTER — Ambulatory Visit: Admitting: Family Medicine

## 2024-01-20 ENCOUNTER — Encounter: Payer: Self-pay | Admitting: Professional

## 2024-01-20 ENCOUNTER — Ambulatory Visit (INDEPENDENT_AMBULATORY_CARE_PROVIDER_SITE_OTHER): Admitting: Professional

## 2024-01-20 DIAGNOSIS — F331 Major depressive disorder, recurrent, moderate: Secondary | ICD-10-CM

## 2024-01-20 DIAGNOSIS — F4322 Adjustment disorder with anxiety: Secondary | ICD-10-CM

## 2024-01-20 NOTE — Progress Notes (Signed)
 [facility] Counselor/Therapist Progress Note  Patient ID: Connie Carlson, MRN: [id number],    Prefers to go by [name], her middle name  Date: 01/20/2024  Time Spent:  49 minutes [id number]   Treatment Type: Individual Therapy  Risk Assessment: Danger to Self:  No Self-injurious Behavior: No Danger to Others: No  Subjective: The patient arrived late for her inperson appointment.   Issues addressed: 1-physical -sees Gyn on May 7th -has noticed much improvement -no bleeding outside of her period 1/mo 2-mood -bright, pleasant, and easily engaged -improved mood, restorative sleep, improved energy -meds increased slightly for increased benefits -active and engaged in life 3-mother -addressing pt about her son not receiving first communion or being confirmed in [employer] -pt does not believe as her mother does and continually tries to dodge these conversations -pt listen to Tereso Fenton from [place] who is pentecostal -encouraged patient to chart her own course in her spirituality   Treatment Plan Problems Addressed  Career Success Obstacles, Depression, Low Self-Esteem/Lack of Assertiveness, Partner Relational Problems  Goals 1. Develop a sense of own personal power and self-esteem within the relationship. 2. Develop advocacy and empowerment skills that involve taking a proactive stance in the work environment. 3. Develop assertiveness skills and techniques for application in work-related activities (e.g., establishing boundaries with coworkers, Garment/textile technologist). 4. Develop healthy cognitive mechanisms to facilitate positive attitudes and beliefs about self within the context of one's environment to mitigate depressive symptoms. Objective Articulate signs and symptoms of depression in current life experiences. Target Date: 2024-11-03 Frequency: Biweekly  Progress: 70 Modality: individual  Related Interventions Encourage the client to share her  feelings of depression to gain an insight into precipitating events and implications of symptoms; normalize her feelings of depression. Objective Implement behavioral interventions to overcome depression. Target Date: 2024-11-03 Frequency: Biweekly  Progress: 30 Modality: individual  Related Interventions Assign the client to participate as fully as possible in a healthy exercise regimen. Encourage the client to continue participation in positive social support systems. Objective Identify and replace cognitive self-talk that supports depression. Target Date: 2024-11-03 Frequency: Biweekly  Progress: 30 Modality: individual  Related Interventions Reinforce the client's positive, reality-based cognitive messages that enhance self-confidence and increase adaptive action (see "Positive Self-Talk" in the Adult Psychotherapy Homework Planner, 2nd ed. by Beacher Bottoms). Do "behavioral experiments" in which depressive automatic thoughts are treated as hypotheses/predictions, reality-based alternative hypotheses/ predictions are generated, and both are tested against the client's past, present, and/or future experiences. Encourage the client to discuss cognitive distortions, including automatic thoughts (e.g., negative view of self, future, experience) and negative schemas (e.g., core beliefs about self and others based on earlier childhood experiences); assess frequency of negative self-statements associated with depression. Objective Increase the frequency of engaging in pleasant activities. Target Date: 2024-11-03 Frequency: Biweekly  Progress: 30 Modality: individual  Related Interventions Assign the client reading materials regarding overcoming depression for women (e.g., Women and Depression: A Practical Self-Help Guide by Elnita Hai; Feeling Good by Donnette Gal; Women & Depression by Harmon Lights; Silencing the Self: Women and Depression by Marylou Sobers). Objective Identify precipitating events and factors of depression,  particularly roles of self and others in depression. Target Date: 2024-11-03 Frequency: Biweekly   Modality: individual  Related Interventions Explore the degree to which primary symptoms of depression are a result of gender role socialization (e.g., passivity, decreased self-esteem, learned helplessness, interpersonal orientation towards pleasing others). Evaluate symptoms to consider the presence of culture-bound syndromes and/or culture specificity of symptoms (e.g., ataques de nervios, amok, postemigration  effects). Objective Increase the level of physical exercise. Target Date: 2024-11-03 Frequency: Biweekly  Progress: 10 Modality: individual  Objective Increase social contacts and communicate needs within existing interpersonal relationships. Target Date: 2024-11-03 Frequency: Biweekly  Progress: 30 Modality: individual  5. Explore career options that have been automatically ruled out due to low self-concept or limited opportunities. Objective Identify and replace at least three negative self-talk statements that perpetuate feelings of powerlessness or hopelessness. Target Date: 2024-11-03 Frequency: Biweekly  Progress: 10 Modality: individual  Objective Identify and replace cognitive self-talk that contributes to all-or-nothing thinking, gender-role stereotypes, or low career self-efficacy. Target Date: 2024-11-03 Frequency: Biweekly  Progress: 10 Modality: individual  Objective Develop career-related strategies to overcome and persist in the face of obstacles. Target Date: 2024-11-03 Frequency: Biweekly  Progress: 30 Modality: individual  Objective Increase self-esteem and self-concept by exploring and encompassing other roles/identities besides those of career. Target Date: 2024-11-03 Frequency: Biweekly  Progress: 20 Modality: individual  Objective Identify barriers and personal benefits to balancing career and family life. Target Date: 2024-11-03 Frequency: Biweekly   Progress: 60 Modality: individual  6. Identify and increase intrapersonal, interpersonal, and physical resources to foster positive coping strategies. 7. Improve depressed mood to maximize effective social, occupational, and physical functioning. 8. Improve self-esteem and develop a positive self-image as capable and competent. 9. Increase ability to express needs and desires openly and honestly. 10. Increase assertiveness skills and ability to advocate for self. 11. Increase awareness of own role in relationship conflicts. Objective Identify a pattern of repeatedly forming destructive intimate relationships. Target Date: 2024-11-03 Frequency: Biweekly  Progress: 60 Modality: individual  12. Increase involvement in activities that foster confidence and a sense of accomplishment. Objective Acknowledge self-disparaging statements and recognize the tendency to engage in such statements. Target Date: 2024-11-03 Frequency: Biweekly  Progress: 60 Modality: individual  Related Interventions Ask the client to complete and process an exercise in the book Ten Days to Self-Esteem! Donnette Gal). Objective Verbalize an understanding of the differences between passive, assertive, and aggressive behavior. Target Date: 2024-11-03 Frequency: Biweekly  Progress: 50 Modality: individual  Objective Describe personal history of self-devaluation and lack of assertiveness. Target Date: 2024-11-03 Frequency: Biweekly  Progress: 60 Modality: individual  Related Interventions Use cognitive-restructuring techniques to change the client's belief system (i.e., via reality-testing and rational thought) toward a more positive, realistic self-perception. Assist the client in becoming aware of how she indirectly expresses negative feelings about self (e.g., lack of eye contact, social withdrawal, sweating, expectations of failure or rejection). Assist the client in identifying negative beliefs about herself. Discuss  context of self-disparagement, including client's perceptions of self from childhood to present, role of caregivers and peers, and related experiences. Objective Identify three roadblocks to improved self-esteem. Target Date: 2024-11-03 Frequency: Biweekly  Progress: 0 Modality: individual  Objective   Diagnosis:Major depressive disorder, recurrent episode, moderate (HCC)  Adjustment disorder with anxiety  Plan: -meet again on Friday, Feb 03, 2024 at 8am in person

## 2024-01-24 ENCOUNTER — Encounter: Payer: Self-pay | Attending: Obstetrics and Gynecology | Admitting: Physical Therapy

## 2024-01-24 ENCOUNTER — Encounter: Payer: Self-pay | Admitting: Physical Therapy

## 2024-01-24 DIAGNOSIS — R1030 Lower abdominal pain, unspecified: Secondary | ICD-10-CM | POA: Insufficient documentation

## 2024-01-24 DIAGNOSIS — R252 Cramp and spasm: Secondary | ICD-10-CM | POA: Diagnosis present

## 2024-01-24 DIAGNOSIS — R102 Pelvic and perineal pain: Secondary | ICD-10-CM | POA: Diagnosis present

## 2024-01-24 DIAGNOSIS — M6281 Muscle weakness (generalized): Secondary | ICD-10-CM | POA: Insufficient documentation

## 2024-01-24 NOTE — Therapy (Signed)
 OUTPATIENT PHYSICAL THERAPY FEMALE PELVIC TREATMENT   Patient Name: Connie Carlson MRN: [id number] DOB:1981-02-27, 43 y.o., female Today's Date: 01/24/2024  END OF SESSION:  PT End of Session - 01/24/24 1141     Visit Number 13    Date for PT Re-Evaluation 02/28/24    Authorization Type [employer]    PT Start Time 1135    PT Stop Time 1200   had to leave for the dentist   PT Time Calculation (min) 25 min    Activity Tolerance Patient tolerated treatment well    Behavior During Therapy [facility] for tasks assessed/performed             Past Medical History:  Diagnosis Date   High blood pressure    Past Surgical History:  Procedure Laterality Date   CHOLECYSTECTOMY     mole remove     under right breast by [facility] - skin cancer   Patient Active Problem List   Diagnosis Date Noted   Other insomnia 03/30/2023   Generalized obesity 03/30/2023   BMI 37.0-37.9, adult 03/30/2023   Elevated glucose 03/30/2023   Vitamin D  deficiency 03/30/2023   Health care maintenance 03/30/2023   SOB (shortness of breath) on exertion 03/30/2023   Other fatigue 03/30/2023   Major depressive disorder, recurrent episode, mild (HCC) 08/26/2022   Stress and adjustment reaction 06/29/2022   Anal fissure 01/13/2022   Depression, recurrent (HCC) 04/07/2021   Hypertension 06/11/2020   Class 3 severe obesity with serious comorbidity and body mass index (BMI) of 40.0 to 44.9 in adult 06/11/2020   IFG (impaired fasting glucose) 10/08/2013   Migraine headache without aura 02/16/2013   WEIGHT GAIN 01/01/2009   Allergic rhinitis 06/28/2006   GASTROESOPHAGEAL REFLUX, NO ESOPHAGITIS 06/28/2006    PCP: Cydney Draft, MD  REFERRING PROVIDER: Izell Marsh, MD   REFERRING DIAG: M62.89 (ICD-10-CM) - Pelvic floor dysfunction   THERAPY DIAG:  Cramp and spasm  Pelvic pain  Muscle weakness (generalized)  Lower abdominal pain  Rationale for Evaluation and Treatment:  Rehabilitation  ONSET DATE: 1/23  SUBJECTIVE:                                                                                                                                                                                           SUBJECTIVE STATEMENT:  I am doing good with going to the bathroom at night and I am not leaking. I am seeing the doctor tomorrow. The pain is 50% better. I am using the wand with some discomfort and soreness afterwards. I had intercourse with my husband and pain decreased by 30%. I only took  the advil for the pain instead of the stronger medicine. I am not ready to try the dry needling.   PAIN:  Are you having pain? Yes NPRS scale: 7/10; 11/22/23 12/13/23 5/10 12/20/23: pain level is 5/10 01/12/24: 7/10 after cleaning in house and after doing the wand  Pain location:  lower abdominal area  Pain type: pressure, like something is going to come out of the vagina Pain description: intermittent   Aggravating factors: lifting heavy items, standing several hours, carrying and bending over Relieving factors: Advil  PRECAUTIONS: None  RED FLAGS: None   WEIGHT BEARING RESTRICTIONS: No  FALLS:  Has patient fallen in last 6 months? No  LIVING ENVIRONMENT: Lives with: lives with their family  OCCUPATION: works at [employer] with a lot of lifting.   PLOF: Independent  PATIENT GOALS: reduce pain  PERTINENT HISTORY:  Cholecystectomy Sexual abuse: No  BOWEL MOVEMENT: no issues  URINATION: Pain with urination: No Fully empty bladder: Yes:   Stream: Strong Urgency: No Frequency: average Leakage: Coughing, sneeze, laughing 12/13/23: leakage 30 % better.  12/20/23: urinary leakage is 40% better 01/12/24: leak with sneeze, no leakage with laughing Pads: No  INTERCOURSE: Pain with intercourse: After Intercourse, 10/10 Ability to have vaginal penetration:  Yes:   Climax: not when having pain Marinoff Scale: 2/3  PREGNANCY: Vaginal deliveries 1 Tearing  No  OBJECTIVE:  Note: Objective measures were completed at Evaluation unless otherwise noted.  DIAGNOSTIC FINDINGS:  Ultrasound negative; biopsy is negative   COGNITION: Overall cognitive status: Within functional limits for tasks assessed     SENSATION: Light touch: Appears intact Proprioception: Appears intact   POSTURE: No Significant postural limitations  PELVIC ALIGNMENT:  LUMBARAROM/PROM: full lumbar ROM   LOWER EXTREMITY ROM: full bilateral hip ROM   LOWER EXTREMITY MMT: Bilateral hip strength is 4/5.    PALPATION:   General  tenderness located in the lower abdomen, decreased movement of the lower rib cage, good mobility of the diaphragm, tenderness in the buttocks                External Perineal Exam tenderness located in the bulbocavernosus, ischiocavernosus, perineal body                             Internal Pelvic Floor levator ani, obturator internist, side of the introitus, sides of the bladder  Patient confirms identification and approves PT to assess internal pelvic floor and treatment Yes  PELVIC MMT:   MMT eval 10/11/23 01/12/24  Vaginal 3/5 holding for 6 seconds 3/5 3/5 with difficulty recruiting  (Blank rows = not tested)        TONE: increased  PROLAPSE: None 12/13/23: hen patient bears down in supine anterior wall weakness to the introitus   TODAY'S TREATMENT:    01/24/24 Exercises: Strengthening: Supine lift alternate shoulder and leg with core 15 x each side with 1 # in each hand and red band around knees Supine bridge with hip abduction using red band Supine bring right knee to hand increased right anterior hip pain so stopped, then only did half way where there is no pain, then progressed to work where there is no pain.     01/12/24 Manual: Internal pelvic floor techniques: No emotional/communication barriers or cognitive limitation. Patient is motivated to learn. Patient understands and agrees with treatment goals and plan. PT  explains patient will be examined in standing, sitting, and lying down to see how their muscles  and joints work. When they are ready, they will be asked to remove their underwear so PT can examine their perineum. The patient is also given the option of providing their own chaperone as one is not provided in our facility. The patient also has the right and is explained the right to defer or refuse any part of the evaluation or treatment including the internal exam. With the patient's consent, PT will use one gloved finger to gently assess the muscles of the pelvic floor, seeing how well it contracts and relaxes and if there is muscle symmetry. After, the patient will get dressed and PT and patient will discuss exam findings and plan of care. PT and patient discuss plan of care, schedule, attendance policy and HEP activities.  Going through the vaginal canal working on the perineal body, levator ani with one finger internally and other externally going slowly and monitoring for pain. Patient was laying on her right side.   Exercises: Strengthening: Hip adduction with core engagement 15 times each side Supine lift alternate shoulder and leg with core 15 x each side, had some discomfort in the groin Bridge with bilateral shoulder flexion holding 1 # wt. 20 x Standing marching with pelvic floor contraction.     12/20/23 Exercises: Stretches/mobility: Sitting piriformis stretch holding 30 sec bil.  Supine with laying on foam roll to increased mobility of the thoracic lumbar junction to reduce pain with diaphragmatic breathing Laying on side with strap to stretch the hip flexor holding 30 sec  bil.  Earline Glenn pose with arms on foam roll to stretch the thoracic further Strengthening: Tried to lay on foam roll along the spine and lift arm but cramp in the left leg Supine lift alternate shoulder and leg with core 15 x each side, had some discomfort in the groin Bridge with bilateral shoulder flexion holding 1  # wt. 20 x Hip auction laying on her side while pressing on ball 15 x Hip abduction laying on side while bringing 1# wt over head 15 x  Standing Pallof with 30# band working the core Squatting in range she does not have discomfort with tactile cues to engage the lower abdominals     PATIENT EDUCATION:  01/12/24 Education details: Access Code: [email or web address], educated patient on dry needling.  Person educated: Patient Education method: Explanation, Demonstration, Tactile cues, Verbal cues, and Handouts Education comprehension: verbalized understanding, returned demonstration, verbal cues required, tactile cues required, and needs further education  HOME EXERCISE PROGRAM: 12/13/23 Access Code: [email or web address] URL: [email or web address] Date: 12/20/2023 Prepared by: Marsha Skeen  Exercises - Seated Piriformis Stretch with Trunk Bend  - 1 x daily - 7 x weekly - 1 sets - 1 reps - 30 sec hold - Seated Hamstring Stretch  - 1 x daily - 7 x weekly - 1 sets - 1 reps - 30 sec hold - Seated Happy Baby With Trunk Flexion For Pelvic Relaxation  - 1 x daily - 7 x weekly - 1 sets - 1 reps - 30 sec hold - Cat Cow  - 1 x daily - 7 x weekly - 1 sets - 10 reps - Diaphragmatic Breathing in Child's Pose with Pelvic Floor Relaxation  - 1 x daily - 7 x weekly - 1 sets - 5 reps - 10 sec hold - Supine Diaphragmatic Breathing  - 1 x daily - 7 x weekly - 1 sets - 10 reps - Sidelying Thoracic Rotation with Open Book  - 1 x daily - 2 x weekly -  1 sets - 10 reps - Diaphragmatic Breathing with Hips Elevated (for Pelvic Organ Prolapse)  - 1 x daily - 7 x weekly - 3 sets - 10 reps - Bent Knee Fallouts  - 1 x daily - 3 x weekly - 2 sets - 10 reps - Supine Transversus Abdominis Bracing with Heel Slide  - 1 x daily - 3 x weekly - 2 sets - 10 reps - Diaphragmatic Breathing with Hips Elevated (for Pelvic Organ Prolapse)  - 2 x daily - 7 x weekly - 1 sets - 10 reps - Supine Bridge with Pelvic Floor Contraction  - 1 x daily -  3 x weekly - 1 sets - 10 reps - Quadruped Leg Lifts  - 1 x daily - 3 x weekly - 2 sets - 10 reps - Dead Bug  - 1 x daily - 3 x weekly - 2 sets - 10 reps - Sidelying Hip Adduction  - 1 x daily - 3 x weekly - 2 sets - 10 reps - Sidelying Pelvic Floor Contraction with Hip Abduction  - 1 x daily - 3 x weekly - 2 sets - 10 reps    ASSESSMENT:  CLINICAL IMPRESSION: Patient is a 43 y.o. female who was seen today for physical therapy  treatment for pelvic floor dysfunction. Patient is not having to get up at night now to urinate. Urinary leakage is 60%. Pain with penile penetration is 30% better and did not have to take the strong pain medication instead used Advil.  better.Patient is having difficulty with relaxing her pelvic floor. She is using the wand at home. Patients overall pelvic pain is 50% better.  Patient may need dry needling to the pelvic floor muscles to further elongate them but is not ready today.  Patient is sore in the lower abdominal and pelvic floor area after the manual work. Patient will benefit from skilled therapy to reduce her pain to restore her function.    OBJECTIVE IMPAIRMENTS: decreased activity tolerance, decreased strength, increased fascial restrictions, increased muscle spasms, and pain.   ACTIVITY LIMITATIONS: lifting and standing  PARTICIPATION LIMITATIONS: cleaning, laundry, shopping, community activity, and occupation  PERSONAL FACTORS: Fitness are also affecting patient's functional outcome.   REHAB POTENTIAL: Excellent  CLINICAL DECISION MAKING: Stable/uncomplicated  EVALUATION COMPLEXITY: Low   GOALS: Goals reviewed with patient? Yes  SHORT TERM GOALS: Target date: 09/26/23  Patient independent with initial HEP for flexibility and diaphragmatic  breathing.  Baseline: Goal status: Met 10/04/23  2.  Patient educated on correct lifting technique to reduce strain on the pelvic floor.  Baseline:  Goal status: met 11/15/23  3.  Patient educated on  perineal massage to reduce trigger points.  Baseline: educated patient on vaginal wand Goal status: Met 11/15/23   LONG TERM GOALS: Target date: 02/1024  Patient independent with advanced HEP for core and pelvic floor strength.  Baseline:  Goal status: INITIAL  2.  Patient able to lift 20# with minimal to no pain due to reduction of trigger points in the pelvic floor.  Baseline:  Goal status: INITIAL  3.  Patient reports minimal to no pain after penile penetration due to the ability to lengthen the pelvic floor.  Baseline:  Goal status: INITIAL  4.  Patient reports her lower abdominal pain decreased </= 0-1/10 with standing 1-2 hours at work due to improve strength of the core and hips.  Baseline: pain level 6/10 Goal status: ongoing 12/20/23   PLAN:  PT FREQUENCY: 1x/week  PT DURATION: 6 months  PLANNED INTERVENTIONS: 97110-Therapeutic exercises, 97530- Therapeutic activity, [id number]- Neuromuscular re-education, 97140- Manual therapy, 97014- Electrical stimulation (unattended), 97035- Ultrasound, Patient/Family education, Taping, Dry Needling, Cryotherapy, Moist heat, and Biofeedback  PLAN FOR NEXT SESSION:  internal  pelvic floor work, dry needling of pelvic floor core strength,  lifting, prolapse management; see what doctor says  Marsha Skeen, PT 01/24/24 1:18 PM

## 2024-01-25 ENCOUNTER — Ambulatory Visit: Admitting: Obstetrics and Gynecology

## 2024-01-25 VITALS — BP 136/81 | HR 73 | Wt 237.0 lb

## 2024-01-25 DIAGNOSIS — M7918 Myalgia, other site: Secondary | ICD-10-CM | POA: Diagnosis not present

## 2024-01-25 NOTE — Progress Notes (Signed)
   RETURN GYNECOLOGY VISIT  Subjective:  Connie Carlson is a 43 y.o. G1P1001 with LMP 01/11/24 presenting for follow up of myofascial pelvic pain.   Chronic abdominal/pelvic pain and dyspareunia that is suspected to be myofascial. Small fibroids (<19mm) but otherwise normal pelvic US  08/03/22, EMB benign 04/26/22. She has a job that involves significant heavy lifting, bending, and twisting. She is working with pelvic floor PT regularly and her pain is improving but not yet at goal. Next step is dry needling but she is nervous about it and wondering what my opinion is.   She is out on short term disability because her work could not accommodate a 25lb lifting restriction. She has 3 more months of PFPT planned and is worried about losing her progress if she goes back to work.   Objective:   Vitals:   01/25/24 0951  BP: 136/81  Pulse: 73  Weight: 237 lb (107.5 kg)    General:  Alert, oriented and cooperative. Patient is in no acute distress.  Skin: Skin is warm and dry. No rash noted.   Cardiovascular: Normal heart rate noted  Respiratory: Normal respiratory effort, no problems with respiration noted   Assessment and Plan:  Connie Carlson is a 43 y.o. with myofascial pelvic pain, improving  Continue 25lb lifting restriction x 12 weeks Continue weekly PFPT Reassess in 12 weeks to return to work  Return in about 3 months (around 04/26/2024) for follow up gyn.  Future Appointments  Date Time Provider Department Center  01/31/2024  2:00 PM Bernestine Brighter, PT [facility]  02/03/2024  8:00 AM Devera Fluke, [facility] None  02/07/2024  9:15 AM Helane Lloyd, FNP [facility] None  02/17/2024  8:00 AM Devera Fluke, [facility] None  03/02/2024  8:00 AM Devera Fluke, [facility] None  03/16/2024  8:00 AM Devera Fluke, [facility] None  04/06/2024  9:30 AM Bernestine Brighter, PT [facility] None    Izell Marsh, MD

## 2024-01-25 NOTE — Progress Notes (Signed)
 Patient states pelvic pain has slightly improved. Connie Carlson l Andria Head, CMA

## 2024-01-31 ENCOUNTER — Encounter: Admitting: Physical Therapy

## 2024-02-02 ENCOUNTER — Encounter: Admitting: Physical Therapy

## 2024-02-02 ENCOUNTER — Encounter: Payer: Self-pay | Admitting: Physical Therapy

## 2024-02-02 DIAGNOSIS — R1030 Lower abdominal pain, unspecified: Secondary | ICD-10-CM

## 2024-02-02 DIAGNOSIS — R102 Pelvic and perineal pain: Secondary | ICD-10-CM

## 2024-02-02 DIAGNOSIS — R252 Cramp and spasm: Secondary | ICD-10-CM

## 2024-02-02 DIAGNOSIS — M6281 Muscle weakness (generalized): Secondary | ICD-10-CM

## 2024-02-02 NOTE — Therapy (Signed)
 OUTPATIENT PHYSICAL THERAPY FEMALE PELVIC TREATMENT   Patient Name: Connie Carlson MRN: [id number] DOB:1980/12/29, 43 y.o., female Today's Date: 02/02/2024  END OF SESSION:  PT End of Session - 02/02/24 1036     Visit Number 14    Date for PT Re-Evaluation 02/28/24    Authorization Type [employer]    Authorization - Visit Number 14    Authorization - Number of Visits 90    PT Start Time 1030    PT Stop Time 1015    PT Time Calculation (min) 1425 min    Activity Tolerance Patient tolerated treatment well    Behavior During Therapy WFL for tasks assessed/performed             Past Medical History:  Diagnosis Date   High blood pressure    Past Surgical History:  Procedure Laterality Date   CHOLECYSTECTOMY     mole remove     under right breast by [facility] - skin cancer   Patient Active Problem List   Diagnosis Date Noted   Other insomnia 03/30/2023   Generalized obesity 03/30/2023   BMI 37.0-37.9, adult 03/30/2023   Elevated glucose 03/30/2023   Vitamin D  deficiency 03/30/2023   Health care maintenance 03/30/2023   SOB (shortness of breath) on exertion 03/30/2023   Other fatigue 03/30/2023   Major depressive disorder, recurrent episode, mild (HCC) 08/26/2022   Stress and adjustment reaction 06/29/2022   Anal fissure 01/13/2022   Depression, recurrent (HCC) 04/07/2021   Hypertension 06/11/2020   Class 3 severe obesity with serious comorbidity and body mass index (BMI) of 40.0 to 44.9 in adult 06/11/2020   IFG (impaired fasting glucose) 10/08/2013   Migraine headache without aura 02/16/2013   WEIGHT GAIN 01/01/2009   Allergic rhinitis 06/28/2006   GASTROESOPHAGEAL REFLUX, NO ESOPHAGITIS 06/28/2006    PCP: Cydney Draft, MD  REFERRING PROVIDER: Izell Marsh, MD   REFERRING DIAG: M62.89 (ICD-10-CM) - Pelvic floor dysfunction   THERAPY DIAG:  Cramp and spasm  Pelvic pain  Muscle weakness (generalized)  Lower abdominal  pain  Rationale for Evaluation and Treatment: Rehabilitation  ONSET DATE: 1/23  SUBJECTIVE:                                                                                                                                                                                           SUBJECTIVE STATEMENT:  MD keeping the physical therapy and out of work for another 12 weeks. MD felt the pelvic floor muscles need work. I have had a pain in the right low back and only at night. I am bleeding a little bit and  may start my cycle.   PAIN:  Are you having pain? Yes NPRS scale: 7/10; 11/22/23 12/13/23 5/10 12/20/23: pain level is 5/10 01/12/24: 7/10 after cleaning in house and after doing the wand 02/02/24: pain 5/10 Pain location: lower abdominal area  Pain type: pressure, like something is going to come out of the vagina Pain description: intermittent   Aggravating factors: lifting heavy items, standing several hours, carrying and bending over Relieving factors: Advil  PRECAUTIONS: None  RED FLAGS: None   WEIGHT BEARING RESTRICTIONS: No  FALLS:  Has patient fallen in last 6 months? No  LIVING ENVIRONMENT: Lives with: lives with their family  OCCUPATION: works at [employer] with a lot of lifting.   PLOF: Independent  PATIENT GOALS: reduce pain  PERTINENT HISTORY:  Cholecystectomy Sexual abuse: No  BOWEL MOVEMENT: no issues  URINATION: Pain with urination: No Fully empty bladder: Yes:   Stream: Strong Urgency: No Frequency: average Leakage: Coughing, sneeze, laughing 12/13/23: leakage 30 % better.  12/20/23: urinary leakage is 40% better 01/12/24: leak with sneeze, no leakage with laughing Pads: No  INTERCOURSE: Pain with intercourse: After Intercourse, 10/10 Ability to have vaginal penetration:  Yes:   Climax: not when having pain Marinoff Scale: 2/3  PREGNANCY: Vaginal deliveries 1 Tearing No  OBJECTIVE:  Note: Objective measures were completed at Evaluation unless  otherwise noted.  DIAGNOSTIC FINDINGS:  Ultrasound negative; biopsy is negative   COGNITION: Overall cognitive status: Within functional limits for tasks assessed     SENSATION: Light touch: Appears intact Proprioception: Appears intact   POSTURE: No Significant postural limitations  PELVIC ALIGNMENT:  LUMBARAROM/PROM: full lumbar ROM   LOWER EXTREMITY ROM: full bilateral hip ROM   LOWER EXTREMITY MMT: Bilateral hip strength is 4/5.    PALPATION:   General  tenderness located in the lower abdomen, decreased movement of the lower rib cage, good mobility of the diaphragm, tenderness in the buttocks                External Perineal Exam tenderness located in the bulbocavernosus, ischiocavernosus, perineal body                             Internal Pelvic Floor levator ani, obturator internist, side of the introitus, sides of the bladder  Patient confirms identification and approves PT to assess internal pelvic floor and treatment Yes  PELVIC MMT:   MMT eval 10/11/23 01/12/24  Vaginal 3/5 holding for 6 seconds 3/5 3/5 with difficulty recruiting  (Blank rows = not tested)        TONE: increased  PROLAPSE: None 12/13/23: hen patient bears down in supine anterior wall weakness to the introitus   TODAY'S TREATMENT:  02/02/24 Manual: Spinal mobilization: PA and rotational mobilization to T5-L5 Mobilization of the posterior rib cage in prone NExercises: Stretches/mobility: Supine hamstring stretch with strap holding 30 sec bilaterally Supine knee to chest with strap due to pressure in the lower abdomen holding 30 sec Supine lateral hip stretch with strap holding 30 sec bilaterally, pain in the pubic bone area Laying on side hip flexor stretch with strap holding 30 sec bilaterally x 2 Cat cow with tactile cues to flex the spine and posteriorly tilt the pelvis 15 x  Thread the needle stretch 5 times each side Strengthening: Bridge with keeping knees apart and feel the  stretch of the anterior hips 15 x Quadruped lift opposite extremity Prone lift one extremity to lengthen the front of  the hip and strength the glutes with VC to lift hip      01/24/24 Exercises: Strengthening: Supine lift alternate shoulder and leg with core 15 x each side with 1 # in each hand and red band around knees Supine bridge with hip abduction using red band Supine bring right knee to hand increased right anterior hip pain so stopped, then only did half way where there is no pain, then progressed to work where there is no pain.     01/12/24 Manual: Internal pelvic floor techniques: No emotional/communication barriers or cognitive limitation. Patient is motivated to learn. Patient understands and agrees with treatment goals and plan. PT explains patient will be examined in standing, sitting, and lying down to see how their muscles and joints work. When they are ready, they will be asked to remove their underwear so PT can examine their perineum. The patient is also given the option of providing their own chaperone as one is not provided in our facility. The patient also has the right and is explained the right to defer or refuse any part of the evaluation or treatment including the internal exam. With the patient's consent, PT will use one gloved finger to gently assess the muscles of the pelvic floor, seeing how well it contracts and relaxes and if there is muscle symmetry. After, the patient will get dressed and PT and patient will discuss exam findings and plan of care. PT and patient discuss plan of care, schedule, attendance policy and HEP activities.  Going through the vaginal canal working on the perineal body, levator ani with one finger internally and other externally going slowly and monitoring for pain. Patient was laying on her right side.   Exercises: Strengthening: Hip adduction with core engagement 15 times each side Supine lift alternate shoulder and leg with core 15 x each  side, had some discomfort in the groin Bridge with bilateral shoulder flexion holding 1 # wt. 20 x Standing marching with pelvic floor contraction.      PATIENT EDUCATION:  01/12/24 Education details: Access Code: [email or web address], educated patient on dry needling.  Person educated: Patient Education method: Explanation, Demonstration, Tactile cues, Verbal cues, and Handouts Education comprehension: verbalized understanding, returned demonstration, verbal cues required, tactile cues required, and needs further education  HOME EXERCISE PROGRAM: 12/13/23 Access Code: [email or web address] URL: [email or web address] Date: 12/20/2023 Prepared by: Marsha Skeen  Exercises - Seated Piriformis Stretch with Trunk Bend  - 1 x daily - 7 x weekly - 1 sets - 1 reps - 30 sec hold - Seated Hamstring Stretch  - 1 x daily - 7 x weekly - 1 sets - 1 reps - 30 sec hold - Seated Happy Baby With Trunk Flexion For Pelvic Relaxation  - 1 x daily - 7 x weekly - 1 sets - 1 reps - 30 sec hold - Cat Cow  - 1 x daily - 7 x weekly - 1 sets - 10 reps - Diaphragmatic Breathing in Child's Pose with Pelvic Floor Relaxation  - 1 x daily - 7 x weekly - 1 sets - 5 reps - 10 sec hold - Supine Diaphragmatic Breathing  - 1 x daily - 7 x weekly - 1 sets - 10 reps - Sidelying Thoracic Rotation with Open Book  - 1 x daily - 2 x weekly - 1 sets - 10 reps - Diaphragmatic Breathing with Hips Elevated (for Pelvic Organ Prolapse)  - 1 x daily - 7 x weekly - 3 sets - 10 reps -  Bent Knee Fallouts  - 1 x daily - 3 x weekly - 2 sets - 10 reps - Supine Transversus Abdominis Bracing with Heel Slide  - 1 x daily - 3 x weekly - 2 sets - 10 reps - Diaphragmatic Breathing with Hips Elevated (for Pelvic Organ Prolapse)  - 2 x daily - 7 x weekly - 1 sets - 10 reps - Supine Bridge with Pelvic Floor Contraction  - 1 x daily - 3 x weekly - 1 sets - 10 reps - Quadruped Leg Lifts  - 1 x daily - 3 x weekly - 2 sets - 10 reps - Dead Bug  - 1 x daily - 3 x  weekly - 2 sets - 10 reps - Sidelying Hip Adduction  - 1 x daily - 3 x weekly - 2 sets - 10 reps - Sidelying Pelvic Floor Contraction with Hip Abduction  - 1 x daily - 3 x weekly - 2 sets - 10 reps    ASSESSMENT:  CLINICAL IMPRESSION: Patient is a 43 y.o. female who was seen today for physical therapy  treatment for pelvic floor dysfunction. Patient is not having to get up at night now to urinate. Urinary leakage is 60%.   better.Patient is having difficulty with relaxing her pelvic floor. She is using the wand at home. Patients overall pelvic pain is 50% better. She has tightness in the lumbar which could refer pain into the lower abdominal area. Her pain decreased from 5/10 to 2/10 after the spinal mobilization.  Patient will benefit from skilled therapy to reduce her pain to restore her function.    OBJECTIVE IMPAIRMENTS: decreased activity tolerance, decreased strength, increased fascial restrictions, increased muscle spasms, and pain.   ACTIVITY LIMITATIONS: lifting and standing  PARTICIPATION LIMITATIONS: cleaning, laundry, shopping, community activity, and occupation  PERSONAL FACTORS: Fitness are also affecting patient's functional outcome.   REHAB POTENTIAL: Excellent  CLINICAL DECISION MAKING: Stable/uncomplicated  EVALUATION COMPLEXITY: Low   GOALS: Goals reviewed with patient? Yes  SHORT TERM GOALS: Target date: 09/26/23  Patient independent with initial HEP for flexibility and diaphragmatic  breathing.  Baseline: Goal status: Met 10/04/23  2.  Patient educated on correct lifting technique to reduce strain on the pelvic floor.  Baseline:  Goal status: met 11/15/23  3.  Patient educated on perineal massage to reduce trigger points.  Baseline: educated patient on vaginal wand Goal status: Met 11/15/23   LONG TERM GOALS: Target date: 02/1024  Patient independent with advanced HEP for core and pelvic floor strength.  Baseline:  Goal status: INITIAL  2.  Patient  able to lift 20# with minimal to no pain due to reduction of trigger points in the pelvic floor.  Baseline:  Goal status: ongoing 02/02/24  3.  Patient reports minimal to no pain after penile penetration due to the ability to lengthen the pelvic floor.  Baseline: pain level 6/10 Goal status: ongoing 02/02/24   4.  Patient reports her lower abdominal pain decreased </= 0-1/10 with standing 1-2 hours at work due to improve strength of the core and hips.  Baseline: pain level 6/10 Goal status: ongoing 12/20/23   PLAN:  PT FREQUENCY: 1x/week  PT DURATION: 6 months  PLANNED INTERVENTIONS: 97110-Therapeutic exercises, 97530- Therapeutic activity, 97112- Neuromuscular re-education, 97140- Manual therapy, 97014- Electrical stimulation (unattended), 97035- Ultrasound, Patient/Family education, Taping, Dry Needling, Cryotherapy, Moist heat, and Biofeedback  PLAN FOR NEXT SESSION:  internal  pelvic floor work,  lifting, core strength, lifting  Marsha Skeen, PT  02/02/24 11:29 AM

## 2024-02-03 ENCOUNTER — Ambulatory Visit: Admitting: Professional

## 2024-02-07 ENCOUNTER — Ambulatory Visit: Admitting: Nurse Practitioner

## 2024-02-14 ENCOUNTER — Encounter: Payer: Self-pay | Admitting: Physical Therapy

## 2024-02-17 ENCOUNTER — Encounter: Payer: Self-pay | Admitting: Professional

## 2024-02-17 ENCOUNTER — Ambulatory Visit: Admitting: Professional

## 2024-02-17 DIAGNOSIS — F331 Major depressive disorder, recurrent, moderate: Secondary | ICD-10-CM

## 2024-02-17 DIAGNOSIS — F4322 Adjustment disorder with anxiety: Secondary | ICD-10-CM

## 2024-02-17 NOTE — Progress Notes (Signed)
 [facility] Counselor/Therapist Progress Note  Patient ID: Connie Carlson, MRN: [id number],    Prefers to go by [name], her middle name  Date: 02/17/2024  Time Spent:  53 minutes [id number]   Treatment Type: Individual Therapy  Risk Assessment: Danger to Self:  No Self-injurious Behavior: No Danger to Others: No  Subjective: The patient arrived on time for her inperson appointment.   Issues addressed: 1-physical -off work until July -feels at about 50% recovery related to pelvic floor -dislikes that she has gained weight and does not feel good about herself 2-mood -friendly, down on herself -redirectable 3-self-esteem -admits she still has echoes of Giovanni's criticisms of her -pt starts things but doesn't follow through -discussed importance of small changes 4-future considerations she is making -pt discussed her interest in becoming a foster parent -pt has began to ask questions and research -she has obtained an application from [employer] -pt plans to continue thinking about and will discuss at next visit  Treatment Plan Problems Addressed  Career Success Obstacles, Depression, Low Self-Esteem/Lack of Assertiveness, Partner Relational Problems  Goals 1. Develop a sense of own personal power and self-esteem within the relationship. 2. Develop advocacy and empowerment skills that involve taking a proactive stance in the work environment. 3. Develop assertiveness skills and techniques for application in work-related activities (e.g., establishing boundaries with coworkers, Garment/textile technologist). 4. Develop healthy cognitive mechanisms to facilitate positive attitudes and beliefs about self within the context of one's environment to mitigate depressive symptoms. Objective Articulate signs and symptoms of depression in current life experiences. Target Date: 2024-11-03 Frequency: Biweekly  Progress: 70 Modality: individual  Related  Interventions Encourage the client to share her feelings of depression to gain an insight into precipitating events and implications of symptoms; normalize her feelings of depression. Objective Implement behavioral interventions to overcome depression. Target Date: 2024-11-03 Frequency: Biweekly  Progress: 30 Modality: individual  Related Interventions Assign the client to participate as fully as possible in a healthy exercise regimen. Encourage the client to continue participation in positive social support systems. Objective Identify and replace cognitive self-talk that supports depression. Target Date: 2024-11-03 Frequency: Biweekly  Progress: 30 Modality: individual  Related Interventions Reinforce the client's positive, reality-based cognitive messages that enhance self-confidence and increase adaptive action (see "Positive Self-Talk" in the Adult Psychotherapy Homework Planner, 2nd ed. by Beacher Bottoms). Do "behavioral experiments" in which depressive automatic thoughts are treated as hypotheses/predictions, reality-based alternative hypotheses/ predictions are generated, and both are tested against the client's past, present, and/or future experiences. Encourage the client to discuss cognitive distortions, including automatic thoughts (e.g., negative view of self, future, experience) and negative schemas (e.g., core beliefs about self and others based on earlier childhood experiences); assess frequency of negative self-statements associated with depression. Objective Increase the frequency of engaging in pleasant activities. Target Date: 2024-11-03 Frequency: Biweekly  Progress: 30 Modality: individual  Related Interventions Assign the client reading materials regarding overcoming depression for women (e.g., Women and Depression: A Practical Self-Help Guide by Elnita Hai; Feeling Good by Donnette Gal; Women & Depression by Harmon Lights; Silencing the Self: Women and Depression by  Marylou Sobers). Objective Identify precipitating events and factors of depression, particularly roles of self and others in depression. Target Date: 2024-11-03 Frequency: Biweekly   Modality: individual  Related Interventions Explore the degree to which primary symptoms of depression are a result of gender role socialization (e.g., passivity, decreased self-esteem, learned helplessness, interpersonal orientation towards pleasing others). Evaluate symptoms to consider the presence of culture-bound syndromes and/or culture specificity  of symptoms (e.g., ataques de nervios, amok, postemigration effects). Objective Increase the level of physical exercise. Target Date: 2024-11-03 Frequency: Biweekly  Progress: 10 Modality: individual  Objective Increase social contacts and communicate needs within existing interpersonal relationships. Target Date: 2024-11-03 Frequency: Biweekly  Progress: 30 Modality: individual  5. Explore career options that have been automatically ruled out due to low self-concept or limited opportunities. Objective Identify and replace at least three negative self-talk statements that perpetuate feelings of powerlessness or hopelessness. Target Date: 2024-11-03 Frequency: Biweekly  Progress: 10 Modality: individual  Objective Identify and replace cognitive self-talk that contributes to all-or-nothing thinking, gender-role stereotypes, or low career self-efficacy. Target Date: 2024-11-03 Frequency: Biweekly  Progress: 10 Modality: individual  Objective Develop career-related strategies to overcome and persist in the face of obstacles. Target Date: 2024-11-03 Frequency: Biweekly  Progress: 30 Modality: individual  Objective Increase self-esteem and self-concept by exploring and encompassing other roles/identities besides those of career. Target Date: 2024-11-03 Frequency: Biweekly  Progress: 20 Modality: individual  Objective Identify barriers and personal benefits to balancing  career and family life. Target Date: 2024-11-03 Frequency: Biweekly  Progress: 60 Modality: individual  6. Identify and increase intrapersonal, interpersonal, and physical resources to foster positive coping strategies. 7. Improve depressed mood to maximize effective social, occupational, and physical functioning. 8. Improve self-esteem and develop a positive self-image as capable and competent. 9. Increase ability to express needs and desires openly and honestly. 10. Increase assertiveness skills and ability to advocate for self. 11. Increase awareness of own role in relationship conflicts. Objective Identify a pattern of repeatedly forming destructive intimate relationships. Target Date: 2024-11-03 Frequency: Biweekly  Progress: 60 Modality: individual  12. Increase involvement in activities that foster confidence and a sense of accomplishment. Objective Acknowledge self-disparaging statements and recognize the tendency to engage in such statements. Target Date: 2024-11-03 Frequency: Biweekly  Progress: 60 Modality: individual  Related Interventions Ask the client to complete and process an exercise in the book Ten Days to Self-Esteem! Donnette Gal). Objective Verbalize an understanding of the differences between passive, assertive, and aggressive behavior. Target Date: 2024-11-03 Frequency: Biweekly  Progress: 50 Modality: individual  Objective Describe personal history of self-devaluation and lack of assertiveness. Target Date: 2024-11-03 Frequency: Biweekly  Progress: 60 Modality: individual  Related Interventions Use cognitive-restructuring techniques to change the client's belief system (i.e., via reality-testing and rational thought) toward a more positive, realistic self-perception. Assist the client in becoming aware of how she indirectly expresses negative feelings about self (e.g., lack of eye contact, social withdrawal, sweating, expectations of failure or rejection). Assist the  client in identifying negative beliefs about herself. Discuss context of self-disparagement, including client's perceptions of self from childhood to present, role of caregivers and peers, and related experiences. Objective Identify three roadblocks to improved self-esteem. Target Date: 2024-11-03 Frequency: Biweekly  Progress: 0 Modality: individual  Objective   Diagnosis:Major depressive disorder, recurrent episode, moderate (HCC)  Adjustment disorder with anxiety  Plan: -meet again on Friday, March 02, 2024 at 8am in person

## 2024-02-23 ENCOUNTER — Encounter: Payer: Self-pay | Admitting: Physical Therapy

## 2024-03-02 ENCOUNTER — Ambulatory Visit: Admitting: Professional

## 2024-03-06 ENCOUNTER — Encounter: Payer: Self-pay | Admitting: Physical Therapy

## 2024-03-06 ENCOUNTER — Encounter: Payer: Self-pay | Attending: Obstetrics and Gynecology | Admitting: Physical Therapy

## 2024-03-06 DIAGNOSIS — R1030 Lower abdominal pain, unspecified: Secondary | ICD-10-CM | POA: Diagnosis present

## 2024-03-06 DIAGNOSIS — R252 Cramp and spasm: Secondary | ICD-10-CM | POA: Insufficient documentation

## 2024-03-06 DIAGNOSIS — R102 Pelvic and perineal pain: Secondary | ICD-10-CM | POA: Diagnosis present

## 2024-03-06 DIAGNOSIS — M6281 Muscle weakness (generalized): Secondary | ICD-10-CM | POA: Diagnosis present

## 2024-03-06 NOTE — Therapy (Signed)
 OUTPATIENT PHYSICAL THERAPY FEMALE PELVIC TREATMENT   Patient Name: Connie Carlson MRN: [id number] DOB:06-17-1981, 43 y.o., female Today's Date: 03/06/2024  END OF SESSION:  PT End of Session - 03/06/24 0930     Visit Number 15    Date for PT Re-Evaluation 09/03/24    Authorization Type [employer]    Authorization - Visit Number 15    Authorization - Number of Visits 90    PT Start Time 0930    PT Stop Time 1015    PT Time Calculation (min) 45 min    Activity Tolerance Patient tolerated treatment well    Behavior During Therapy WFL for tasks assessed/performed          Past Medical History:  Diagnosis Date   High blood pressure    Past Surgical History:  Procedure Laterality Date   CHOLECYSTECTOMY     mole remove     under right breast by [facility] - skin cancer   Patient Active Problem List   Diagnosis Date Noted   Other insomnia 03/30/2023   Generalized obesity 03/30/2023   BMI 37.0-37.9, adult 03/30/2023   Elevated glucose 03/30/2023   Vitamin D  deficiency 03/30/2023   Health care maintenance 03/30/2023   SOB (shortness of breath) on exertion 03/30/2023   Other fatigue 03/30/2023   Major depressive disorder, recurrent episode, mild (HCC) 08/26/2022   Stress and adjustment reaction 06/29/2022   Anal fissure 01/13/2022   Depression, recurrent (HCC) 04/07/2021   Hypertension 06/11/2020   Class 3 severe obesity with serious comorbidity and body mass index (BMI) of 40.0 to 44.9 in adult 06/11/2020   IFG (impaired fasting glucose) 10/08/2013   Migraine headache without aura 02/16/2013   WEIGHT GAIN 01/01/2009   Allergic rhinitis 06/28/2006   GASTROESOPHAGEAL REFLUX, NO ESOPHAGITIS 06/28/2006    PCP: Cydney Draft, MD  REFERRING PROVIDER: Izell Marsh, MD   REFERRING DIAG: M62.89 (ICD-10-CM) - Pelvic floor dysfunction   THERAPY DIAG:  Cramp and spasm - Plan: PT plan of care cert/re-cert  Pelvic pain - Plan: PT plan of care  cert/re-cert  Muscle weakness (generalized) - Plan: PT plan of care cert/re-cert  Lower abdominal pain - Plan: PT plan of care cert/re-cert  Rationale for Evaluation and Treatment: Rehabilitation  ONSET DATE: 1/23  SUBJECTIVE:                                                                                                                                                                                           SUBJECTIVE STATEMENT:  My grandmother passed away so out of town for 1.5 weeks. I have done the wand 3  times. I have not been walking. I still feel the pain and intermittent. I still feel the pressure when has the pain. The pain is 50% better. I threw up 2 times and leaked urine. No intercourse since last visit.     PAIN:  Are you having pain? Yes NPRS scale: 7/10; 11/22/23 12/13/23 5/10 12/20/23: pain level is 5/10 01/12/24: 7/10 after cleaning in house and after doing the wand 02/02/24: pain 5/10 03/06/24: pain level is 5/10 Pain location: lower abdominal area  Pain type: pressure, like something is going to come out of the vagina Pain description: intermittent   Aggravating factors: lifting heavy items, standing several hours, carrying and bending over Relieving factors: Advil  PRECAUTIONS: None  RED FLAGS: None   WEIGHT BEARING RESTRICTIONS: No  FALLS:  Has patient fallen in last 6 months? No  LIVING ENVIRONMENT: Lives with: lives with their family  OCCUPATION: works at [employer] with a lot of lifting.   PLOF: Independent  PATIENT GOALS: reduce pain  PERTINENT HISTORY:  Cholecystectomy Sexual abuse: No  BOWEL MOVEMENT: no issues  URINATION: Pain with urination: No Fully empty bladder: Yes:   Stream: Strong Urgency: No Frequency: average Leakage: Coughing, sneeze, laughing 12/13/23: leakage 30 % better.  12/20/23: urinary leakage is 40% better 01/12/24: leak with sneeze, no leakage with laughing 03/06/24: leak urine only when she sneezes Pads:  No  INTERCOURSE: Pain with intercourse: After Intercourse, 10/10 Ability to have vaginal penetration:  Yes:   Climax: not when having pain Marinoff Scale: 2/3  PREGNANCY: Vaginal deliveries 1 Tearing No  OBJECTIVE:  Note: Objective measures were completed at Evaluation unless otherwise noted.  DIAGNOSTIC FINDINGS:  Ultrasound negative; biopsy is negative   COGNITION: Overall cognitive status: Within functional limits for tasks assessed     SENSATION: Light touch: Appears intact Proprioception: Appears intact   POSTURE: No Significant postural limitations  PELVIC ALIGNMENT:  LUMBARAROM/PROM: full lumbar ROM   LOWER EXTREMITY ROM: full bilateral hip ROM   LOWER EXTREMITY MMT: Bilateral hip strength is 4/5.    PALPATION:   General  tenderness located in the lower abdomen, decreased movement of the lower rib cage, good mobility of the diaphragm, tenderness in the buttocks                External Perineal Exam tenderness located in the bulbocavernosus, ischiocavernosus, perineal body                             Internal Pelvic Floor levator ani, obturator internist, side of the introitus, sides of the bladder  Patient confirms identification and approves PT to assess internal pelvic floor and treatment Yes  PELVIC MMT:   MMT eval 10/11/23 01/12/24  Vaginal 3/5 holding for 6 seconds 3/5 3/5 with difficulty recruiting  (Blank rows = not tested)        TONE: increased  PROLAPSE: None 12/13/23: hen patient bears down in supine anterior wall weakness to the introitus   TODAY'S TREATMENT:  03/06/24 Neuromuscular re-education: Core retraining: Supine transverse abdominus contraction with tactile cues to contract the lower abdominals and pelvic floor instead of bearing down Supine blue band around knees with marching and holding the ball between hands with verbal cues to use the abdominal muscles to pull lig up Sit to stand squeezing ball between hands with  breathing out and pulling up her core and pelvic floor. She understands if she gets the pinching pain it is due to  her bearing down.  Standing squeezing ball between hands and move trunk side to side to work the obliques.  Standing on one leg and moving the other in 1/2 circle with tactile cues to the abdomen to lift and engage and engage the gluteals Exercises: Stretches/mobility: Long sit to stretch the hamstrings holding 1 minute Sitting v stretch to stretch the hip adductors holding for 1 minute Laying on side hip flexor stretch holding 30 sec on both sides Strengthening: Quadruped lift opposite arm and leg 20 x  Thread the needle going through the full motion 5 times each side Bridge with knee out and blue circular band around the knees and bilateral shoulder flexion with 3# wt in hands 15 X    02/02/24 Manual: Spinal mobilization: PA and rotational mobilization to T5-L5 Mobilization of the posterior rib cage in prone NExercises: Stretches/mobility: Supine hamstring stretch with strap holding 30 sec bilaterally Supine knee to chest with strap due to pressure in the lower abdomen holding 30 sec Supine lateral hip stretch with strap holding 30 sec bilaterally, pain in the pubic bone area Laying on side hip flexor stretch with strap holding 30 sec bilaterally x 2 Cat cow with tactile cues to flex the spine and posteriorly tilt the pelvis 15 x  Thread the needle stretch 5 times each side Strengthening: Bridge with keeping knees apart and feel the stretch of the anterior hips 15 x Quadruped lift opposite extremity Prone lift one extremity to lengthen the front of the hip and strength the glutes with VC to lift hip      01/24/24 Exercises: Strengthening: Supine lift alternate shoulder and leg with core 15 x each side with 1 # in each hand and red band around knees Supine bridge with hip abduction using red band Supine bring right knee to hand increased right anterior hip pain so  stopped, then only did half way where there is no pain, then progressed to work where there is no pain.      PATIENT EDUCATION:  01/12/24 Education details: Access Code: [email or web address], educated patient on dry needling.  Person educated: Patient Education method: Explanation, Demonstration, Tactile cues, Verbal cues, and Handouts Education comprehension: verbalized understanding, returned demonstration, verbal cues required, tactile cues required, and needs further education  HOME EXERCISE PROGRAM: 12/13/23 Access Code: [email or web address] URL: [email or web address] Date: 12/20/2023 Prepared by: Marsha Skeen  Exercises - Seated Piriformis Stretch with Trunk Bend  - 1 x daily - 7 x weekly - 1 sets - 1 reps - 30 sec hold - Seated Hamstring Stretch  - 1 x daily - 7 x weekly - 1 sets - 1 reps - 30 sec hold - Seated Happy Baby With Trunk Flexion For Pelvic Relaxation  - 1 x daily - 7 x weekly - 1 sets - 1 reps - 30 sec hold - Cat Cow  - 1 x daily - 7 x weekly - 1 sets - 10 reps - Diaphragmatic Breathing in Child's Pose with Pelvic Floor Relaxation  - 1 x daily - 7 x weekly - 1 sets - 5 reps - 10 sec hold - Supine Diaphragmatic Breathing  - 1 x daily - 7 x weekly - 1 sets - 10 reps - Sidelying Thoracic Rotation with Open Book  - 1 x daily - 2 x weekly - 1 sets - 10 reps - Diaphragmatic Breathing with Hips Elevated (for Pelvic Organ Prolapse)  - 1 x daily - 7 x weekly - 3 sets - 10 reps - Bent Knee  Fallouts  - 1 x daily - 3 x weekly - 2 sets - 10 reps - Supine Transversus Abdominis Bracing with Heel Slide  - 1 x daily - 3 x weekly - 2 sets - 10 reps - Diaphragmatic Breathing with Hips Elevated (for Pelvic Organ Prolapse)  - 2 x daily - 7 x weekly - 1 sets - 10 reps - Supine Bridge with Pelvic Floor Contraction  - 1 x daily - 3 x weekly - 1 sets - 10 reps - Quadruped Leg Lifts  - 1 x daily - 3 x weekly - 2 sets - 10 reps - Dead Bug  - 1 x daily - 3 x weekly - 2 sets - 10 reps - Sidelying Hip  Adduction  - 1 x daily - 3 x weekly - 2 sets - 10 reps - Sidelying Pelvic Floor Contraction with Hip Abduction  - 1 x daily - 3 x weekly - 2 sets - 10 reps    ASSESSMENT:  CLINICAL IMPRESSION: Patient is a 43 y.o. female who was seen today for physical therapy  treatment for pelvic floor dysfunction. Pelvic pain is 50% better.  Urinary leakage is 60%.   better.Patient is having difficulty with relaxing her pelvic floor. She is using the wand at home. Patient only leaks urine with sneezing and throwing up.  Patient has a tendency to bulge her pelvic floor with exercise and gets the lower abdominal pain. Today she is finally understanding to contract the lower abdominals and pull up the pelvic floor without the lower abdominal pain. Patient was able to correct her contraction with sit to stand and engage the lower abdominals with pelvic floor. Patient will benefit from skilled therapy to reduce her pain to restore her function.    OBJECTIVE IMPAIRMENTS: decreased activity tolerance, decreased strength, increased fascial restrictions, increased muscle spasms, and pain.   ACTIVITY LIMITATIONS: lifting and standing  PARTICIPATION LIMITATIONS: cleaning, laundry, shopping, community activity, and occupation  PERSONAL FACTORS: Fitness are also affecting patient's functional outcome.   REHAB POTENTIAL: Excellent  CLINICAL DECISION MAKING: Stable/uncomplicated  EVALUATION COMPLEXITY: Low   GOALS: Goals reviewed with patient? Yes  SHORT TERM GOALS: Target date: 09/26/23  Patient independent with initial HEP for flexibility and diaphragmatic  breathing.  Baseline: Goal status: Met 10/04/23  2.  Patient educated on correct lifting technique to reduce strain on the pelvic floor.  Baseline:  Goal status: met 11/15/23  3.  Patient educated on perineal massage to reduce trigger points.  Baseline: educated patient on vaginal wand Goal status: Met 11/15/23   LONG TERM GOALS: Target date:  09/03/24  Patient independent with advanced HEP for core and pelvic floor strength.  Baseline: continues to learn as she progresses Goal status: ongoing 03/06/24  2.  Patient able to lift 20# with minimal to no pain due to reduction of trigger points in the pelvic floor.  Baseline: lifting limit 20 # Goal status: ongoing 03/06/24  3.  Patient reports minimal to no pain after penile penetration due to the ability to lengthen the pelvic floor.  Baseline: pain level 6/10 Goal status: ongoing 03/06/24   4.  Patient reports her lower abdominal pain decreased </= 0-1/10 with standing 1-2 hours at work due to improve strength of the core and hips.  Baseline: pain level 6/10 Goal status: ongoing 03/06/24   PLAN:  PT FREQUENCY: 1x/week  PT DURATION: 6 months  PLANNED INTERVENTIONS: 97110-Therapeutic exercises, 97530- Therapeutic activity, [id number]- Neuromuscular re-education, 97140- Manual therapy, 97014- Electrical  stimulation (unattended), 97035- Ultrasound, Patient/Family education, Taping, Dry Needling, Cryotherapy, Moist heat, and Biofeedback  PLAN FOR NEXT SESSION:  internal  pelvic floor work,  lifting, core strength,  update HEP  Marsha Skeen, PT 03/06/24 10:25 AM

## 2024-03-16 ENCOUNTER — Encounter: Payer: Self-pay | Admitting: Professional

## 2024-03-16 ENCOUNTER — Ambulatory Visit (INDEPENDENT_AMBULATORY_CARE_PROVIDER_SITE_OTHER): Admitting: Professional

## 2024-03-16 DIAGNOSIS — F331 Major depressive disorder, recurrent, moderate: Secondary | ICD-10-CM | POA: Diagnosis not present

## 2024-03-16 DIAGNOSIS — F4322 Adjustment disorder with anxiety: Secondary | ICD-10-CM

## 2024-03-16 NOTE — Progress Notes (Signed)
 [facility] Counselor/Therapist Progress Note  Patient ID: Connie Carlson, MRN: [id number],    Prefers to go by [name], her middle name  Date: 03/16/2024  Time Spent:  51 minutes [phone]   Treatment Type: Individual Therapy  Risk Assessment: Danger to Self:  No Self-injurious Behavior: No Danger to Others: No  Subjective: The patient arrived on late for her inperson appointment.   Issues addressed: 1-physical -has been doing PT exercises at home -has not seen PT in office in two weeks -experienced pain while cleaning that caused her to stop and be immobile for about 12 hours -still out of work until at least end of July; will see MD for clearance in mid July 2-mood -denies feeling depressed but endorses lack of interest, low motivation, isolation, and loneliness 3-medication -pt notices positive impact -doesn't take daily when she wakes up in a good mood -discussed importance of consistency to keep mood trending in positive direction 4-social isolation -discussed importance of pt getting involved in community of her choice -asked her to consider an accountability partner to help her stay task focused  Treatment Plan Problems Addressed  Career Success Obstacles, Depression, Low Self-Esteem/Lack of Assertiveness, Partner Relational Problems  Goals 1. Develop a sense of own personal power and self-esteem within the relationship. 2. Develop advocacy and empowerment skills that involve taking a proactive stance in the work environment. 3. Develop assertiveness skills and techniques for application in work-related activities (e.g., establishing boundaries with coworkers, Garment/textile technologist). 4. Develop healthy cognitive mechanisms to facilitate positive attitudes and beliefs about self within the context of one's environment to mitigate depressive symptoms. Objective Articulate signs and symptoms of depression in current life experiences. Target Date: 2024-11-03  Frequency: Biweekly  Progress: 70 Modality: individual  Related Interventions Encourage the client to share her feelings of depression to gain an insight into precipitating events and implications of symptoms; normalize her feelings of depression. Objective Implement behavioral interventions to overcome depression. Target Date: 2024-11-03 Frequency: Biweekly  Progress: 30 Modality: individual  Related Interventions Assign the client to participate as fully as possible in a healthy exercise regimen. Encourage the client to continue participation in positive social support systems. Objective Identify and replace cognitive self-talk that supports depression. Target Date: 2024-11-03 Frequency: Biweekly  Progress: 30 Modality: individual  Related Interventions Reinforce the client's positive, reality-based cognitive messages that enhance self-confidence and increase adaptive action (see Positive Self-Talk in the Adult Psychotherapy Homework Planner, 2nd ed. by Jenniffer). Do behavioral experiments in which depressive automatic thoughts are treated as hypotheses/predictions, reality-based alternative hypotheses/ predictions are generated, and both are tested against the client's past, present, and/or future experiences. Encourage the client to discuss cognitive distortions, including automatic thoughts (e.g., negative view of self, future, experience) and negative schemas (e.g., core beliefs about self and others based on earlier childhood experiences); assess frequency of negative self-statements associated with depression. Objective Increase the frequency of engaging in pleasant activities. Target Date: 2024-11-03 Frequency: Biweekly  Progress: 30 Modality: individual  Related Interventions Assign the client reading materials regarding overcoming depression for women (e.g., Women and Depression: A Practical Self-Help Guide by Jarold; Feeling Good by Geofm; Women & Depression by Gweneth;  Silencing the Self: Women and Depression by Marinell). Objective Identify precipitating events and factors of depression, particularly roles of self and others in depression. Target Date: 2024-11-03 Frequency: Biweekly   Modality: individual  Related Interventions Explore the degree to which primary symptoms of depression are a result of gender role socialization (e.g., passivity, decreased self-esteem,  learned helplessness, interpersonal orientation towards pleasing others). Evaluate symptoms to consider the presence of culture-bound syndromes and/or culture specificity of symptoms (e.g., ataques de nervios, amok, postemigration effects). Objective Increase the level of physical exercise. Target Date: 2024-11-03 Frequency: Biweekly  Progress: 10 Modality: individual  Objective Increase social contacts and communicate needs within existing interpersonal relationships. Target Date: 2024-11-03 Frequency: Biweekly  Progress: 30 Modality: individual  5. Explore career options that have been automatically ruled out due to low self-concept or limited opportunities. Objective Identify and replace at least three negative self-talk statements that perpetuate feelings of powerlessness or hopelessness. Target Date: 2024-11-03 Frequency: Biweekly  Progress: 10 Modality: individual  Objective Identify and replace cognitive self-talk that contributes to all-or-nothing thinking, gender-role stereotypes, or low career self-efficacy. Target Date: 2024-11-03 Frequency: Biweekly  Progress: 10 Modality: individual  Objective Develop career-related strategies to overcome and persist in the face of obstacles. Target Date: 2024-11-03 Frequency: Biweekly  Progress: 30 Modality: individual  Objective Increase self-esteem and self-concept by exploring and encompassing other roles/identities besides those of career. Target Date: 2024-11-03 Frequency: Biweekly  Progress: 20 Modality: individual  Objective Identify  barriers and personal benefits to balancing career and family life. Target Date: 2024-11-03 Frequency: Biweekly  Progress: 60 Modality: individual  6. Identify and increase intrapersonal, interpersonal, and physical resources to foster positive coping strategies. 7. Improve depressed mood to maximize effective social, occupational, and physical functioning. 8. Improve self-esteem and develop a positive self-image as capable and competent. 9. Increase ability to express needs and desires openly and honestly. 10. Increase assertiveness skills and ability to advocate for self. 11. Increase awareness of own role in relationship conflicts. Objective Identify a pattern of repeatedly forming destructive intimate relationships. Target Date: 2024-11-03 Frequency: Biweekly  Progress: 60 Modality: individual  12. Increase involvement in activities that foster confidence and a sense of accomplishment. Objective Acknowledge self-disparaging statements and recognize the tendency to engage in such statements. Target Date: 2024-11-03 Frequency: Biweekly  Progress: 60 Modality: individual  Related Interventions Ask the client to complete and process an exercise in the book Ten Days to Self-Esteem! [employer]). Objective Verbalize an understanding of the differences between passive, assertive, and aggressive behavior. Target Date: 2024-11-03 Frequency: Biweekly  Progress: 50 Modality: individual  Objective Describe personal history of self-devaluation and lack of assertiveness. Target Date: 2024-11-03 Frequency: Biweekly  Progress: 60 Modality: individual  Related Interventions Use cognitive-restructuring techniques to change the client's belief system (i.e., via reality-testing and rational thought) toward a more positive, realistic self-perception. Assist the client in becoming aware of how she indirectly expresses negative feelings about self (e.g., lack of eye contact, social withdrawal, sweating,  expectations of failure or rejection). Assist the client in identifying negative beliefs about herself. Discuss context of self-disparagement, including client's perceptions of self from childhood to present, role of caregivers and peers, and related experiences. Objective Identify three roadblocks to improved self-esteem. Target Date: 2024-11-03 Frequency: Biweekly  Progress: 0 Modality: individual  Objective   Diagnosis:Major depressive disorder, recurrent episode, moderate (HCC)  Adjustment disorder with anxiety  Plan: -meet again on Friday, March 30, 2024 at 8am in person

## 2024-03-20 ENCOUNTER — Encounter: Payer: Self-pay | Attending: Obstetrics and Gynecology | Admitting: Physical Therapy

## 2024-03-20 ENCOUNTER — Encounter: Payer: Self-pay | Admitting: Physical Therapy

## 2024-03-20 ENCOUNTER — Telehealth: Payer: Self-pay | Admitting: *Deleted

## 2024-03-20 DIAGNOSIS — R1030 Lower abdominal pain, unspecified: Secondary | ICD-10-CM | POA: Insufficient documentation

## 2024-03-20 DIAGNOSIS — R252 Cramp and spasm: Secondary | ICD-10-CM | POA: Insufficient documentation

## 2024-03-20 DIAGNOSIS — R102 Pelvic and perineal pain: Secondary | ICD-10-CM | POA: Insufficient documentation

## 2024-03-20 DIAGNOSIS — M6281 Muscle weakness (generalized): Secondary | ICD-10-CM | POA: Insufficient documentation

## 2024-03-20 NOTE — Telephone Encounter (Signed)
 Returned call from 9:16 AM. Left patient a message to call and schedule with Dr. Erik for July.

## 2024-03-20 NOTE — Therapy (Signed)
 OUTPATIENT PHYSICAL THERAPY FEMALE PELVIC TREATMENT   Patient Name: Connie Carlson MRN: [id number] DOB:1981/06/18, 43 y.o., female Today's Date: 03/20/2024  END OF SESSION:  PT End of Session - 03/20/24 1032     Visit Number 16    Date for PT Re-Evaluation 09/03/24    Authorization Type [employer]    Authorization - Visit Number 16    Authorization - Number of Visits 90    PT Start Time 1030    PT Stop Time 1110    PT Time Calculation (min) 40 min    Activity Tolerance Patient tolerated treatment well    Behavior During Therapy WFL for tasks assessed/performed          Past Medical History:  Diagnosis Date   High blood pressure    Past Surgical History:  Procedure Laterality Date   CHOLECYSTECTOMY     mole remove     under right breast by [facility] - skin cancer   Patient Active Problem List   Diagnosis Date Noted   Other insomnia 03/30/2023   Generalized obesity 03/30/2023   BMI 37.0-37.9, adult 03/30/2023   Elevated glucose 03/30/2023   Vitamin D  deficiency 03/30/2023   Health care maintenance 03/30/2023   SOB (shortness of breath) on exertion 03/30/2023   Other fatigue 03/30/2023   Major depressive disorder, recurrent episode, mild (HCC) 08/26/2022   Stress and adjustment reaction 06/29/2022   Anal fissure 01/13/2022   Depression, recurrent (HCC) 04/07/2021   Hypertension 06/11/2020   Class 3 severe obesity with serious comorbidity and body mass index (BMI) of 40.0 to 44.9 in adult 06/11/2020   IFG (impaired fasting glucose) 10/08/2013   Migraine headache without aura 02/16/2013   WEIGHT GAIN 01/01/2009   Allergic rhinitis 06/28/2006   GASTROESOPHAGEAL REFLUX, NO ESOPHAGITIS 06/28/2006    PCP: Alvan Dorothyann BIRCH, MD  REFERRING PROVIDER: Erik Kieth JAYSON, MD   REFERRING DIAG: M62.89 (ICD-10-CM) - Pelvic floor dysfunction   THERAPY DIAG:  Cramp and spasm  Pelvic pain  Muscle weakness (generalized)  Lower abdominal pain  Rationale for  Evaluation and Treatment: Rehabilitation  ONSET DATE: 1/23  SUBJECTIVE:                                                                                                                                                                                           SUBJECTIVE STATEMENT:  I have been doing the exercises and have increased pressure and pain in the pelvic area. Patient is trying to make an appointment with the MD. No penile penetration at this time therefore will not be part of her goals.    PAIN:  Are you having pain? Yes NPRS scale: 7/10; 11/22/23 12/13/23 5/10 12/20/23: pain level is 5/10 01/12/24: 7/10 after cleaning in house and after doing the wand 02/02/24: pain 5/10 03/06/24: pain level is 5/10 03/20/24:  pain level recently with activity is 10/10 and average pain is 5/10  Pain location: lower abdominal area  Pain type: pressure, like something is going to come out of the vagina Pain description: intermittent   Aggravating factors: lifting heavy items, standing several hours, carrying and bending over Relieving factors: Advil  PRECAUTIONS: None  RED FLAGS: None   WEIGHT BEARING RESTRICTIONS: No  FALLS:  Has patient fallen in last 6 months? No  LIVING ENVIRONMENT: Lives with: lives with their family  OCCUPATION: works at [employer] with a lot of lifting.   PLOF: Independent  PATIENT GOALS: reduce pain  PERTINENT HISTORY:  Cholecystectomy Sexual abuse: No  BOWEL MOVEMENT: no issues  URINATION: Pain with urination: No Fully empty bladder: Yes:   Stream: Strong Urgency: No Frequency: average Leakage: Coughing, sneeze, laughing 12/13/23: leakage 30 % better.  12/20/23: urinary leakage is 40% better 01/12/24: leak with sneeze, no leakage with laughing 03/06/24: leak urine only when she sneezes Pads: No  INTERCOURSE: Pain with intercourse: After Intercourse, 10/10 Ability to have vaginal penetration:  Yes:   Climax: not when having pain Marinoff Scale:  2/3  PREGNANCY: Vaginal deliveries 1 Tearing No  OBJECTIVE:  Note: Objective measures were completed at Evaluation unless otherwise noted.  DIAGNOSTIC FINDINGS:  Ultrasound negative; biopsy is negative   COGNITION: Overall cognitive status: Within functional limits for tasks assessed     SENSATION: Light touch: Appears intact Proprioception: Appears intact   POSTURE: No Significant postural limitations  PELVIC ALIGNMENT:  LUMBARAROM/PROM: full lumbar ROM   LOWER EXTREMITY ROM: full bilateral hip ROM   LOWER EXTREMITY MMT: Bilateral hip strength is 4/5.    PALPATION:   General  tenderness located in the lower abdomen, decreased movement of the lower rib cage, good mobility of the diaphragm, tenderness in the buttocks                External Perineal Exam tenderness located in the bulbocavernosus, ischiocavernosus, perineal body                             Internal Pelvic Floor levator ani, obturator internist, side of the introitus, sides of the bladder  Patient confirms identification and approves PT to assess internal pelvic floor and treatment Yes  PELVIC MMT:   MMT eval 10/11/23 01/12/24 03/20/24  Vaginal 3/5 holding for 6 seconds 3/5 3/5 with difficulty recruiting 4/5 holding 3 seconds  (Blank rows = not tested)        TONE: increased  PROLAPSE: None 12/13/23: hen patient bears down in supine anterior wall weakness to the introitus   TODAY'S TREATMENT:  03/20/24 Manual: Myofascial release: Fascial release of the urogenital diaphragm going through the different levels Internal pelvic floor techniques: No emotional/communication barriers or cognitive limitation. Patient is motivated to learn. Patient understands and agrees with treatment goals and plan. PT explains patient will be examined in standing, sitting, and lying down to see how their muscles and joints work. When they are ready, they will be asked to remove their underwear so PT can examine their  perineum. The patient is also given the option of providing their own chaperone as one is not provided in our facility. The patient also has the right and is  explained the right to defer or refuse any part of the evaluation or treatment including the internal exam. With the patient's consent, PT will use one gloved finger to gently assess the muscles of the pelvic floor, seeing how well it contracts and relaxes and if there is muscle symmetry. After, the patient will get dressed and PT and patient will discuss exam findings and plan of care. PT and patient discuss plan of care, schedule, attendance policy and HEP activities.  Therapist gloved finger performing manual work to the right levator ani, ischiocavernosus, pubovaginalis, urethra sphincter and obturator internist to reduce trigger points    03/06/24 Neuromuscular re-education: Core retraining: Supine transverse abdominus contraction with tactile cues to contract the lower abdominals and pelvic floor instead of bearing down Supine blue band around knees with marching and holding the ball between hands with verbal cues to use the abdominal muscles to pull lig up Sit to stand squeezing ball between hands with breathing out and pulling up her core and pelvic floor. She understands if she gets the pinching pain it is due to her bearing down.  Standing squeezing ball between hands and move trunk side to side to work the obliques.  Standing on one leg and moving the other in 1/2 circle with tactile cues to the abdomen to lift and engage and engage the gluteals Exercises: Stretches/mobility: Long sit to stretch the hamstrings holding 1 minute Sitting v stretch to stretch the hip adductors holding for 1 minute Laying on side hip flexor stretch holding 30 sec on both sides Strengthening: Quadruped lift opposite arm and leg 20 x  Thread the needle going through the full motion 5 times each side Bridge with knee out and blue circular band around the  knees and bilateral shoulder flexion with 3# wt in hands 15 X    02/02/24 Manual: Spinal mobilization: PA and rotational mobilization to T5-L5 Mobilization of the posterior rib cage in prone NExercises: Stretches/mobility: Supine hamstring stretch with strap holding 30 sec bilaterally Supine knee to chest with strap due to pressure in the lower abdomen holding 30 sec Supine lateral hip stretch with strap holding 30 sec bilaterally, pain in the pubic bone area Laying on side hip flexor stretch with strap holding 30 sec bilaterally x 2 Cat cow with tactile cues to flex the spine and posteriorly tilt the pelvis 15 x  Thread the needle stretch 5 times each side Strengthening: Bridge with keeping knees apart and feel the stretch of the anterior hips 15 x Quadruped lift opposite extremity Prone lift one extremity to lengthen the front of the hip and strength the glutes with VC to lift hip     PATIENT EDUCATION:  01/12/24 Education details: Access Code: [email or web address], educated patient on dry needling.  Person educated: Patient Education method: Explanation, Demonstration, Tactile cues, Verbal cues, and Handouts Education comprehension: verbalized understanding, returned demonstration, verbal cues required, tactile cues required, and needs further education  HOME EXERCISE PROGRAM: 12/13/23 Access Code: [email or web address] URL: [email or web address] Date: 12/20/2023 Prepared by: Channing Pereyra  Exercises - Seated Piriformis Stretch with Trunk Bend  - 1 x daily - 7 x weekly - 1 sets - 1 reps - 30 sec hold - Seated Hamstring Stretch  - 1 x daily - 7 x weekly - 1 sets - 1 reps - 30 sec hold - Seated Happy Baby With Trunk Flexion For Pelvic Relaxation  - 1 x daily - 7 x weekly - 1 sets - 1 reps - 30 sec hold - Cat  Cow  - 1 x daily - 7 x weekly - 1 sets - 10 reps - Diaphragmatic Breathing in Child's Pose with Pelvic Floor Relaxation  - 1 x daily - 7 x weekly - 1 sets - 5 reps - 10 sec hold -  Supine Diaphragmatic Breathing  - 1 x daily - 7 x weekly - 1 sets - 10 reps - Sidelying Thoracic Rotation with Open Book  - 1 x daily - 2 x weekly - 1 sets - 10 reps - Diaphragmatic Breathing with Hips Elevated (for Pelvic Organ Prolapse)  - 1 x daily - 7 x weekly - 3 sets - 10 reps - Bent Knee Fallouts  - 1 x daily - 3 x weekly - 2 sets - 10 reps - Supine Transversus Abdominis Bracing with Heel Slide  - 1 x daily - 3 x weekly - 2 sets - 10 reps - Diaphragmatic Breathing with Hips Elevated (for Pelvic Organ Prolapse)  - 2 x daily - 7 x weekly - 1 sets - 10 reps - Supine Bridge with Pelvic Floor Contraction  - 1 x daily - 3 x weekly - 1 sets - 10 reps - Quadruped Leg Lifts  - 1 x daily - 3 x weekly - 2 sets - 10 reps - Dead Bug  - 1 x daily - 3 x weekly - 2 sets - 10 reps - Sidelying Hip Adduction  - 1 x daily - 3 x weekly - 2 sets - 10 reps - Sidelying Pelvic Floor Contraction with Hip Abduction  - 1 x daily - 3 x weekly - 2 sets - 10 reps    ASSESSMENT:  CLINICAL IMPRESSION: Patient is a 43 y.o. female who was seen today for physical therapy  treatment for pelvic floor dysfunction. Pelvic pain is 50% better.  Urinary leakage is 60% better  during the night. She will have leakage with sneeze or coughing during the day. Patient is having difficulty with relaxing her pelvic floor. She is using the wand at home.She has increased pain with  increased activity. Pelvic floor strength is 4/5 with slow recruitment. Her pain decreased from 6/10 to 4/10 after manual work.   Patient will benefit from skilled therapy to reduce her pain to restore her function.    OBJECTIVE IMPAIRMENTS: decreased activity tolerance, decreased strength, increased fascial restrictions, increased muscle spasms, and pain.   ACTIVITY LIMITATIONS: lifting and standing  PARTICIPATION LIMITATIONS: cleaning, laundry, shopping, community activity, and occupation  PERSONAL FACTORS: Fitness are also affecting patient's functional  outcome.   REHAB POTENTIAL: Excellent  CLINICAL DECISION MAKING: Stable/uncomplicated  EVALUATION COMPLEXITY: Low   GOALS: Goals reviewed with patient? Yes  SHORT TERM GOALS: Target date: 09/26/23  Patient independent with initial HEP for flexibility and diaphragmatic  breathing.  Baseline: Goal status: Met 10/04/23  2.  Patient educated on correct lifting technique to reduce strain on the pelvic floor.  Baseline:  Goal status: met 11/15/23  3.  Patient educated on perineal massage to reduce trigger points.  Baseline: educated patient on vaginal wand Goal status: Met 11/15/23   LONG TERM GOALS: Target date: 09/03/24  Patient independent with advanced HEP for core and pelvic floor strength.  Baseline: continues to learn as she progresses Goal status: ongoing 03/06/24  2.  Patient able to lift 20# with minimal to no pain due to reduction of trigger points in the pelvic floor.  Baseline: lifting limit 20 # Goal status: ongoing 03/06/24  3.  Patient reports minimal to  no pain after penile penetration due to the ability to lengthen the pelvic floor.  Baseline: pain level 6/10 Goal status: defer due to not appropriate at this time   4.  Patient reports her lower abdominal pain decreased </= 0-1/10 with standing 1-2 hours at work due to improve strength of the core and hips.  Baseline: pain level 6/10 Goal status: ongoing 03/06/24   PLAN:  PT FREQUENCY: 1x/week  PT DURATION: 6 months  PLANNED INTERVENTIONS: 97110-Therapeutic exercises, 97530- Therapeutic activity, 97112- Neuromuscular re-education, 97140- Manual therapy, 97014- Electrical stimulation (unattended), 97035- Ultrasound, Patient/Family education, Taping, Dry Needling, Cryotherapy, Moist heat, and Biofeedback  PLAN FOR NEXT SESSION:  internal  pelvic floor work,  lifting, core strength,  update HEP, see if she has seen the doctor  Channing Pereyra, PT 03/20/24 11:19 AM

## 2024-03-30 ENCOUNTER — Ambulatory Visit: Payer: Self-pay | Admitting: Professional

## 2024-04-06 ENCOUNTER — Encounter: Payer: Self-pay | Admitting: Physical Therapy

## 2024-04-06 ENCOUNTER — Ambulatory Visit: Payer: Self-pay | Admitting: Physical Therapy

## 2024-04-06 DIAGNOSIS — R252 Cramp and spasm: Secondary | ICD-10-CM | POA: Diagnosis not present

## 2024-04-06 DIAGNOSIS — M6281 Muscle weakness (generalized): Secondary | ICD-10-CM

## 2024-04-06 DIAGNOSIS — R102 Pelvic and perineal pain: Secondary | ICD-10-CM

## 2024-04-06 DIAGNOSIS — R1030 Lower abdominal pain, unspecified: Secondary | ICD-10-CM

## 2024-04-06 NOTE — Therapy (Signed)
 OUTPATIENT PHYSICAL THERAPY FEMALE PELVIC TREATMENT   Patient Name: RIGBY SWAMY MRN: [id number] DOB:Jan 07, 1981, 43 y.o., female Today's Date: 04/06/2024  END OF SESSION:  PT End of Session - 04/06/24 0935     Visit Number 17    Date for PT Re-Evaluation 09/03/24    Authorization Type [employer]    Authorization - Visit Number 17    Authorization - Number of Visits 90    PT Start Time 0930    PT Stop Time 1010    PT Time Calculation (min) 40 min    Activity Tolerance Patient tolerated treatment well    Behavior During Therapy WFL for tasks assessed/performed          Past Medical History:  Diagnosis Date   High blood pressure    Past Surgical History:  Procedure Laterality Date   CHOLECYSTECTOMY     mole remove     under right breast by [facility] - skin cancer   Patient Active Problem List   Diagnosis Date Noted   Other insomnia 03/30/2023   Generalized obesity 03/30/2023   BMI 37.0-37.9, adult 03/30/2023   Elevated glucose 03/30/2023   Vitamin D  deficiency 03/30/2023   Health care maintenance 03/30/2023   SOB (shortness of breath) on exertion 03/30/2023   Other fatigue 03/30/2023   Major depressive disorder, recurrent episode, mild (HCC) 08/26/2022   Stress and adjustment reaction 06/29/2022   Anal fissure 01/13/2022   Depression, recurrent (HCC) 04/07/2021   Hypertension 06/11/2020   Class 3 severe obesity with serious comorbidity and body mass index (BMI) of 40.0 to 44.9 in adult 06/11/2020   IFG (impaired fasting glucose) 10/08/2013   Migraine headache without aura 02/16/2013   WEIGHT GAIN 01/01/2009   Allergic rhinitis 06/28/2006   GASTROESOPHAGEAL REFLUX, NO ESOPHAGITIS 06/28/2006    PCP: Alvan Dorothyann BIRCH, MD  REFERRING PROVIDER: Erik Kieth JAYSON, MD   REFERRING DIAG: M62.89 (ICD-10-CM) - Pelvic floor dysfunction   THERAPY DIAG:  Cramp and spasm  Pelvic pain  Muscle weakness (generalized)  Lower abdominal pain  Rationale  for Evaluation and Treatment: Rehabilitation  ONSET DATE: 1/23  SUBJECTIVE:                                                                                                                                                                                           SUBJECTIVE STATEMENT:  I have been moving things out of the house and lifting. I have had some increased pain in the lower abdomen.    PAIN:  Are you having pain? Yes NPRS scale: 7/10; 11/22/23 12/13/23 5/10 12/20/23: pain level is 5/10 01/12/24: 7/10 after  cleaning in house and after doing the wand 02/02/24: pain 5/10 03/06/24: pain level is 5/10 03/20/24:  pain level recently with activity is 10/10 and average pain is 5/10 04/06/24: pain level 8/10  Pain location: lower abdominal area  Pain type: pressure, like something is going to come out of the vagina Pain description: intermittent   Aggravating factors: lifting heavy items, standing several hours, carrying and bending over Relieving factors: Advil  PRECAUTIONS: None  RED FLAGS: None   WEIGHT BEARING RESTRICTIONS: No  FALLS:  Has patient fallen in last 6 months? No  LIVING ENVIRONMENT: Lives with: lives with their family  OCCUPATION: works at [employer] with a lot of lifting.   PLOF: Independent  PATIENT GOALS: reduce pain  PERTINENT HISTORY:  Cholecystectomy Sexual abuse: No  BOWEL MOVEMENT: no issues  URINATION: Pain with urination: No Fully empty bladder: Yes:   Stream: Strong Urgency: No Frequency: average Leakage: Coughing, sneeze, laughing 12/13/23: leakage 30 % better.  12/20/23: urinary leakage is 40% better 01/12/24: leak with sneeze, no leakage with laughing 03/06/24: leak urine only when she sneezes Pads: No  INTERCOURSE: Pain with intercourse: After Intercourse, 10/10 Ability to have vaginal penetration:  Yes:   Climax: not when having pain Marinoff Scale: 2/3  PREGNANCY: Vaginal deliveries 1 Tearing No  OBJECTIVE:  Note: Objective  measures were completed at Evaluation unless otherwise noted.  DIAGNOSTIC FINDINGS:  Ultrasound negative; biopsy is negative   COGNITION: Overall cognitive status: Within functional limits for tasks assessed     SENSATION: Light touch: Appears intact Proprioception: Appears intact   POSTURE: No Significant postural limitations  PELVIC ALIGNMENT:  LUMBARAROM/PROM: full lumbar ROM   LOWER EXTREMITY ROM: full bilateral hip ROM   LOWER EXTREMITY MMT: Bilateral hip strength is 4/5.    PALPATION:   General  tenderness located in the lower abdomen, decreased movement of the lower rib cage, good mobility of the diaphragm, tenderness in the buttocks                External Perineal Exam tenderness located in the bulbocavernosus, ischiocavernosus, perineal body                             Internal Pelvic Floor levator ani, obturator internist, side of the introitus, sides of the bladder  Patient confirms identification and approves PT to assess internal pelvic floor and treatment Yes  PELVIC MMT:   MMT eval 10/11/23 01/12/24 03/20/24  Vaginal 3/5 holding for 6 seconds 3/5 3/5 with difficulty recruiting 4/5 holding 3 seconds  (Blank rows = not tested)        TONE: increased  PROLAPSE: None 12/13/23: when patient bears down in supine anterior wall weakness to the introitus   TODAY'S TREATMENT:  04/06/24 Manual: Internal pelvic floor techniques: No emotional/communication barriers or cognitive limitation. Patient is motivated to learn. Patient understands and agrees with treatment goals and plan. PT explains patient will be examined in standing, sitting, and lying down to see how their muscles and joints work. When they are ready, they will be asked to remove their underwear so PT can examine their perineum. The patient is also given the option of providing their own chaperone as one is not provided in our facility. The patient also has the right and is explained the right to defer  or refuse any part of the evaluation or treatment including the internal exam. With the patient's consent, PT will use one gloved finger  to gently assess the muscles of the pelvic floor, seeing how well it contracts and relaxes and if there is muscle symmetry. After, the patient will get dressed and PT and patient will discuss exam findings and plan of care. PT and patient discuss plan of care, schedule, attendance policy and HEP activities.  Therapist gloved finger in the vaginal canal working on the levator ani, obturator internist, along the ischiocavernosus, bulbocavernosus, and perineal body to elongate the muscle and reduce trigger points Neuromuscular re-education: Core retraining: Core facilitation: Form correction: Pelvic floor contraction training: Down training: Therapist gloved finger in the vaginal canal feeling the pelvic floor relax with several tries. She continues to need verbal cues on how to relax the pelvic floor.  Exercises: Stretches/mobility: Strengthening: Therapeutic activities: Functional strengthening activities: Self-care:     03/20/24 Manual: Myofascial release: Fascial release of the urogenital diaphragm going through the different levels Internal pelvic floor techniques: No emotional/communication barriers or cognitive limitation. Patient is motivated to learn. Patient understands and agrees with treatment goals and plan. PT explains patient will be examined in standing, sitting, and lying down to see how their muscles and joints work. When they are ready, they will be asked to remove their underwear so PT can examine their perineum. The patient is also given the option of providing their own chaperone as one is not provided in our facility. The patient also has the right and is explained the right to defer or refuse any part of the evaluation or treatment including the internal exam. With the patient's consent, PT will use one gloved finger to gently assess the  muscles of the pelvic floor, seeing how well it contracts and relaxes and if there is muscle symmetry. After, the patient will get dressed and PT and patient will discuss exam findings and plan of care. PT and patient discuss plan of care, schedule, attendance policy and HEP activities.  Therapist gloved finger performing manual work to the right levator ani, ischiocavernosus, pubovaginalis, urethra sphincter and obturator internist to reduce trigger points    03/06/24 Neuromuscular re-education: Core retraining: Supine transverse abdominus contraction with tactile cues to contract the lower abdominals and pelvic floor instead of bearing down Supine blue band around knees with marching and holding the ball between hands with verbal cues to use the abdominal muscles to pull lig up Sit to stand squeezing ball between hands with breathing out and pulling up her core and pelvic floor. She understands if she gets the pinching pain it is due to her bearing down.  Standing squeezing ball between hands and move trunk side to side to work the obliques.  Standing on one leg and moving the other in 1/2 circle with tactile cues to the abdomen to lift and engage and engage the gluteals Exercises: Stretches/mobility: Long sit to stretch the hamstrings holding 1 minute Sitting v stretch to stretch the hip adductors holding for 1 minute Laying on side hip flexor stretch holding 30 sec on both sides Strengthening: Quadruped lift opposite arm and leg 20 x  Thread the needle going through the full motion 5 times each side Bridge with knee out and blue circular band around the knees and bilateral shoulder flexion with 3# wt in hands 15 X     PATIENT EDUCATION:  01/12/24 Education details: Access Code: [email or web address], educated patient on dry needling.  Person educated: Patient Education method: Explanation, Demonstration, Tactile cues, Verbal cues, and Handouts Education comprehension: verbalized understanding,  returned demonstration, verbal cues required, tactile cues required,  and needs further education  HOME EXERCISE PROGRAM: 12/13/23 Access Code: [email or web address] URL: [email or web address] Date: 12/20/2023 Prepared by: Channing Pereyra  Exercises - Seated Piriformis Stretch with Trunk Bend  - 1 x daily - 7 x weekly - 1 sets - 1 reps - 30 sec hold - Seated Hamstring Stretch  - 1 x daily - 7 x weekly - 1 sets - 1 reps - 30 sec hold - Seated Happy Baby With Trunk Flexion For Pelvic Relaxation  - 1 x daily - 7 x weekly - 1 sets - 1 reps - 30 sec hold - Cat Cow  - 1 x daily - 7 x weekly - 1 sets - 10 reps - Diaphragmatic Breathing in Child's Pose with Pelvic Floor Relaxation  - 1 x daily - 7 x weekly - 1 sets - 5 reps - 10 sec hold - Supine Diaphragmatic Breathing  - 1 x daily - 7 x weekly - 1 sets - 10 reps - Sidelying Thoracic Rotation with Open Book  - 1 x daily - 2 x weekly - 1 sets - 10 reps - Diaphragmatic Breathing with Hips Elevated (for Pelvic Organ Prolapse)  - 1 x daily - 7 x weekly - 3 sets - 10 reps - Bent Knee Fallouts  - 1 x daily - 3 x weekly - 2 sets - 10 reps - Supine Transversus Abdominis Bracing with Heel Slide  - 1 x daily - 3 x weekly - 2 sets - 10 reps - Diaphragmatic Breathing with Hips Elevated (for Pelvic Organ Prolapse)  - 2 x daily - 7 x weekly - 1 sets - 10 reps - Supine Bridge with Pelvic Floor Contraction  - 1 x daily - 3 x weekly - 1 sets - 10 reps - Quadruped Leg Lifts  - 1 x daily - 3 x weekly - 2 sets - 10 reps - Dead Bug  - 1 x daily - 3 x weekly - 2 sets - 10 reps - Sidelying Hip Adduction  - 1 x daily - 3 x weekly - 2 sets - 10 reps - Sidelying Pelvic Floor Contraction with Hip Abduction  - 1 x daily - 3 x weekly - 2 sets - 10 reps    ASSESSMENT:  CLINICAL IMPRESSION: Patient is a 43 y.o. female who was seen today for physical therapy  treatment for pelvic floor dysfunction. Patient pelvic floor had increased tension today. She  has been moving things  and had increased pelvic pressure and pain. Her pelvic floor strength is 4/5. She had some difficulty with diaphragmatic breathing to elongate the pelvic floor .  Patient has many trigger point internally. She continues to have anterior wall weakness to introitus. Patient has not returned to work yet.  Patient will benefit from skilled therapy to reduce her pain to restore her function.    OBJECTIVE IMPAIRMENTS: decreased activity tolerance, decreased strength, increased fascial restrictions, increased muscle spasms, and pain.   ACTIVITY LIMITATIONS: lifting and standing  PARTICIPATION LIMITATIONS: cleaning, laundry, shopping, community activity, and occupation  PERSONAL FACTORS: Fitness are also affecting patient's functional outcome.   REHAB POTENTIAL: Excellent  CLINICAL DECISION MAKING: Stable/uncomplicated  EVALUATION COMPLEXITY: Low   GOALS: Goals reviewed with patient? Yes  SHORT TERM GOALS: Target date: 09/26/23  Patient independent with initial HEP for flexibility and diaphragmatic  breathing.  Baseline: Goal status: Met 10/04/23  2.  Patient educated on correct lifting technique to reduce strain on the pelvic floor.  Baseline:  Goal status: met 11/15/23  3.  Patient educated on perineal massage to reduce trigger points.  Baseline: educated patient on vaginal wand Goal status: Met 11/15/23   LONG TERM GOALS: Target date: 09/03/24  Patient independent with advanced HEP for core and pelvic floor strength.  Baseline: continues to learn as she progresses Goal status: ongoing 03/06/24  2.  Patient able to lift 20# with minimal to no pain due to reduction of trigger points in the pelvic floor.  Baseline: lifting limit 20 # Goal status: ongoing 03/06/24  3.  Patient reports minimal to no pain after penile penetration due to the ability to lengthen the pelvic floor.  Baseline: pain level 6/10 Goal status: defer due to not appropriate at this time   4.  Patient reports her  lower abdominal pain decreased </= 0-1/10 with standing 1-2 hours at work due to improve strength of the core and hips.  Baseline: pain level 6/10 Goal status: ongoing 03/06/24   PLAN:  PT FREQUENCY: 1x/week  PT DURATION: 6 months  PLANNED INTERVENTIONS: 97110-Therapeutic exercises, 97530- Therapeutic activity, 97112- Neuromuscular re-education, 97140- Manual therapy, 97014- Electrical stimulation (unattended), 97035- Ultrasound, Patient/Family education, Taping, Dry Needling, Cryotherapy, Moist heat, and Biofeedback  PLAN FOR NEXT SESSION:  internal  pelvic floor work,  lifting, core strength,  update HEP, see if she has seen the doctor  Channing Pereyra, PT 04/06/24 9:35 AM

## 2024-04-10 ENCOUNTER — Telehealth: Payer: Self-pay

## 2024-04-10 NOTE — Telephone Encounter (Signed)
 RN returned patient call regarding FMLA questions. RN confirmed beginning date 10/12/23 with end date 04/11/24. Pt requested case # [id number] be listed and to fax to [phone]. Faxed 04/10/2024 per patient request.  Silvano LELON Piano, RN

## 2024-04-11 ENCOUNTER — Encounter: Payer: Self-pay | Admitting: Obstetrics and Gynecology

## 2024-04-11 ENCOUNTER — Ambulatory Visit (INDEPENDENT_AMBULATORY_CARE_PROVIDER_SITE_OTHER): Payer: Self-pay | Admitting: Obstetrics and Gynecology

## 2024-04-11 VITALS — BP 136/82 | HR 86 | Wt 243.0 lb

## 2024-04-11 DIAGNOSIS — N393 Stress incontinence (female) (male): Secondary | ICD-10-CM

## 2024-04-11 DIAGNOSIS — M6289 Other specified disorders of muscle: Secondary | ICD-10-CM

## 2024-04-11 DIAGNOSIS — G8929 Other chronic pain: Secondary | ICD-10-CM

## 2024-04-11 DIAGNOSIS — R102 Pelvic and perineal pain: Secondary | ICD-10-CM

## 2024-04-11 NOTE — Progress Notes (Signed)
 RETURN GYNECOLOGY VISIT  Subjective:  Connie Carlson is a 43 y.o. G1P1001 with LMP 03/04/24 presenting for follow up of chronic myofascial pelvic pain and SUI  Reports that symptoms are about 50% better.  Pain now comes and goes but is still happening usually at least once a day.  She takes ibuprofen 600 mg as needed for pain, and on average is using it around 3 times a week.  She has been adherent with physical therapy but has reached a plateau in her progress.  We talked about dry needling last appointment but she has not tried it yet.   She is on disability leave and has been for over 6 months.  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 Her manager has told her that she should not come back until she can do her job duties without restriction. She would like to go back to work but is worried that she will lose her progress if she is put in a position where she will need to lift heavy objects repeatedly.    She notes that when she is cleaning around her house even just bending she will be okay initially but afterwards she will have pretty severe pain in her lower abdomen and back.  She is separating from her husband, so intercourse is not a current concern.  She is coping as well as one could expect and is working closely with a therapist. She is not taking Flexeril  because it is not helpful with her day-to-day pain.   She is now currently more bothered by stress urinary incontinence.  She notes leakage with coughing, laughing, and sneezing.  There have been times that she was sick and vomited and will completely lose control of her bladder which is very frustrating.  Pelvic floor PT is helping but has not completely alleviated the symptoms.  Objective:   Vitals:   04/11/24 1128  BP: 136/82  Pulse: 86   Weight: 243 lb (110.2 kg)    General:  Alert, oriented and cooperative. Patient is in no acute distress.  Skin: Skin is warm and dry. No rash noted.   Cardiovascular: Normal heart rate noted  Respiratory: Normal respiratory effort, no problems with respiration noted  Abdomen: Soft, non-tender, non-distended   Pelvic: NEFG. Significant tone of levators/obturators with trigger points noted. No uterine/adnexal tenderness.   Exam performed in the presence of a chaperone  Assessment and Plan:  Connie Carlson is a 44 y.o. with chronic pelvic pain related to pelvic floor dysfunction and stress urinary incontinence  1. Chronic pelvic pain in female (Primary) 2. Pelvic floor dysfunction - We have ruled out structural gynecologic causes with pelvic ultrasound in 2023 and she has no new exam findings today, so I would not repeat any gynecologic imaging.  The pattern of her symptoms does still sound most consistent with an MSK etiology of her pain. - She has had a plateau in her progress with pelvic floor physical therapy despite adherence to attending her session.  Recommend she starts dry needling.  Message sent to PT. -Will refer to sports medicine to evaluate for any joint specific concerns and other therapies that could help with back pain - Ambulatory referral to Urogynecology - Will extend her disability leave for another 8 weeks -  Follow-up with me in 6 weeks for reassessment  3. SUI (stress urinary incontinence, female) - Discussed the role for weight loss to help both her pain and incontinence.  She was being seen for medical weight loss, but with her recent separation from her husband she had not been following up as planned -We attempted to trial a ring pessary with support to help with her stress urinary incontinence, but she was unable to tolerate placement due to pain even when trying to insert pessary herself -Discussed the option for a mid urethral sling, and she would like to learn  more about the procedure.  Urogyn referral placed. - Ambulatory referral to Urogynecology  Return in about 6 weeks (around 05/23/2024) for follow up with Dr. Erik - pelvic pain & SUI.  Total encounter time: 35 minutes  Future Appointments  Date Time Provider Department Center  04/12/2024  2:00 PM Elnor Channing FALCON, PT [facility]  04/13/2024  8:00 AM [facility], [facility] None  04/27/2024  8:00 AM Ger[facility] Service, [facility] None  05/11/2024  8:00 AM Ger[facility] Service, [facility] None  05/25/2024  8:00 AM Gerome Service, [facility] None  06/08/2024  8:00 AM Gerome Service, [facility] None    Kieth JAYSON Erik, MD

## 2024-04-12 ENCOUNTER — Encounter: Payer: Self-pay | Admitting: Physical Therapy

## 2024-04-12 ENCOUNTER — Telehealth: Payer: Self-pay | Admitting: Physical Therapy

## 2024-04-12 NOTE — Telephone Encounter (Signed)
 Called patient about her missed appointment today at 14:00. She reported she called earlier to cancel due to having a migraine.  Channing Pereyra, PT @7 /24/25@ 2:21 PM

## 2024-04-13 ENCOUNTER — Ambulatory Visit: Payer: Self-pay | Admitting: Professional

## 2024-04-13 ENCOUNTER — Encounter: Payer: Self-pay | Admitting: Professional

## 2024-04-13 DIAGNOSIS — F331 Major depressive disorder, recurrent, moderate: Secondary | ICD-10-CM | POA: Diagnosis not present

## 2024-04-13 DIAGNOSIS — F4322 Adjustment disorder with anxiety: Secondary | ICD-10-CM

## 2024-04-13 NOTE — Progress Notes (Signed)
 [facility] Counselor/Therapist Progress Note  Patient ID: Connie Carlson, MRN: [id number],    Prefers to go by Connie Carlson, her middle name  Date: 04/13/2024  Time Spent:  56 minutes [id number]   Treatment Type: Individual Therapy  Risk Assessment: Danger to Self:  No Self-injurious Behavior: No Danger to Others: No  Subjective: The patient arrived on late for her inperson appointment.   Issues addressed: 1-mood -denies feeling depressed but endorses lack of interest, low motivation, isolation, and loneliness 2-social -she did not know what she was going to do for her birthday and she decided to have a pool party -she rented a pool in [place] nad will be having her mom, dad, son, niece/nephew, Nels her friend from work, and a [place] met through [employer] got married -he visited her at her home a few weeks ago with her (adopted) brother -pt has everything ready but now feels guilty and embarrassed to be the center of attention -she has never had a big birthday party -pt doesn't understand why no one has ever thrown her a party before 4-self-esteem -why do I not deserve to be called wife  Treatment Plan Problems Addressed  Career Success Obstacles, Depression, Low Self-Esteem/Lack of Assertiveness, Partner Relational Problems  Goals 1. Develop a sense of own personal power and self-esteem within the relationship. 2. Develop advocacy and empowerment skills that involve taking a proactive stance in the work environment. 3. Develop assertiveness skills and techniques for application in work-related activities (e.g., establishing boundaries with coworkers, Garment/textile technologist). 4. Develop healthy cognitive mechanisms to facilitate positive attitudes and beliefs about self within the context of one's environment to mitigate depressive symptoms. Objective Articulate signs and symptoms of depression in current life experiences. Target Date: 2024-11-03 Frequency:  Biweekly  Progress: 70 Modality: individual  Related Interventions Encourage the client to share her feelings of depression to gain an insight into precipitating events and implications of symptoms; normalize her feelings of depression. Objective Implement behavioral interventions to overcome depression. Target Date: 2024-11-03 Frequency: Biweekly  Progress: 30 Modality: individual  Related Interventions Assign the client to participate as fully as possible in a healthy exercise regimen. Encourage the client to continue participation in positive social support systems. Objective Identify and replace cognitive self-talk that supports depression. Target Date: 2024-11-03 Frequency: Biweekly  Progress: 30 Modality: individual  Related Interventions Reinforce the client's positive, reality-based cognitive messages that enhance self-confidence and increase adaptive action (see Positive Self-Talk in the Adult Psychotherapy Homework Planner, 2nd ed. by Jenniffer). Do behavioral experiments in which depressive automatic thoughts are treated as hypotheses/predictions, reality-based alternative hypotheses/ predictions are generated, and both are tested against the client's past, present, and/or future experiences. Encourage the client to discuss cognitive distortions, including automatic thoughts (e.g., negative view of self, future, experience) and negative schemas (e.g., core beliefs about self and others based on earlier childhood experiences); assess frequency of negative self-statements associated with depression. Objective Increase the frequency of engaging in pleasant activities. Target Date: 2024-11-03 Frequency: Biweekly  Progress: 30 Modality: individual  Related Interventions Assign the client reading materials regarding overcoming depression for women (e.g., Women and Depression: A Practical Self-Help Guide by Jarold; Feeling Good by Geofm; Women & Depression by Gweneth; Silencing the  Self: Women and Depression by Marinell). Objective Identify precipitating events and factors of depression, particularly roles of self and others in depression. Target Date: 2024-11-03 Frequency: Biweekly   Modality: individual  Related Interventions Explore the degree to which primary symptoms of depression are a result  of gender role socialization (e.g., passivity, decreased self-esteem, learned helplessness, interpersonal orientation towards pleasing others). Evaluate symptoms to consider the presence of culture-bound syndromes and/or culture specificity of symptoms (e.g., ataques de nervios, amok, postemigration effects). Objective Increase the level of physical exercise. Target Date: 2024-11-03 Frequency: Biweekly  Progress: 10 Modality: individual  Objective Increase social contacts and communicate needs within existing interpersonal relationships. Target Date: 2024-11-03 Frequency: Biweekly  Progress: 30 Modality: individual  5. Explore career options that have been automatically ruled out due to low self-concept or limited opportunities. Objective Identify and replace at least three negative self-talk statements that perpetuate feelings of powerlessness or hopelessness. Target Date: 2024-11-03 Frequency: Biweekly  Progress: 10 Modality: individual  Objective Identify and replace cognitive self-talk that contributes to all-or-nothing thinking, gender-role stereotypes, or low career self-efficacy. Target Date: 2024-11-03 Frequency: Biweekly  Progress: 10 Modality: individual  Objective Develop career-related strategies to overcome and persist in the face of obstacles. Target Date: 2024-11-03 Frequency: Biweekly  Progress: 30 Modality: individual  Objective Increase self-esteem and self-concept by exploring and encompassing other roles/identities besides those of career. Target Date: 2024-11-03 Frequency: Biweekly  Progress: 20 Modality: individual  Objective Identify barriers and  personal benefits to balancing career and family life. Target Date: 2024-11-03 Frequency: Biweekly  Progress: 60 Modality: individual  6. Identify and increase intrapersonal, interpersonal, and physical resources to foster positive coping strategies. 7. Improve depressed mood to maximize effective social, occupational, and physical functioning. 8. Improve self-esteem and develop a positive self-image as capable and competent. 9. Increase ability to express needs and desires openly and honestly. 10. Increase assertiveness skills and ability to advocate for self. 11. Increase awareness of own role in relationship conflicts. Objective Identify a pattern of repeatedly forming destructive intimate relationships. Target Date: 2024-11-03 Frequency: Biweekly  Progress: 60 Modality: individual  12. Increase involvement in activities that foster confidence and a sense of accomplishment. Objective Acknowledge self-disparaging statements and recognize the tendency to engage in such statements. Target Date: 2024-11-03 Frequency: Biweekly  Progress: 60 Modality: individual  Related Interventions Ask the client to complete and process an exercise in the book Ten Days to Self-Esteem! [employer]). Objective Verbalize an understanding of the differences between passive, assertive, and aggressive behavior. Target Date: 2024-11-03 Frequency: Biweekly  Progress: 50 Modality: individual  Objective Describe personal history of self-devaluation and lack of assertiveness. Target Date: 2024-11-03 Frequency: Biweekly  Progress: 60 Modality: individual  Related Interventions Use cognitive-restructuring techniques to change the client's belief system (i.e., via reality-testing and rational thought) toward a more positive, realistic self-perception. Assist the client in becoming aware of how she indirectly expresses negative feelings about self (e.g., lack of eye contact, social withdrawal, sweating, expectations of  failure or rejection). Assist the client in identifying negative beliefs about herself. Discuss context of self-disparagement, including client's perceptions of self from childhood to present, role of caregivers and peers, and related experiences. Objective Identify three roadblocks to improved self-esteem. Target Date: 2024-11-03 Frequency: Biweekly  Progress: 0 Modality: individual  Objective   Diagnosis:Major depressive disorder, recurrent episode, moderate (HCC)  Adjustment disorder with anxiety  Plan: -meet again on Friday, April 30, 2024 at 8am in person

## 2024-04-16 ENCOUNTER — Telehealth: Payer: Self-pay | Admitting: Family Medicine

## 2024-04-16 NOTE — Telephone Encounter (Signed)
 Pt called and asked if Appt notes from 07/23 could be faxed to [employer]  Fax# [phone] Rjdz#[id number]

## 2024-04-18 ENCOUNTER — Encounter: Admitting: Sports Medicine

## 2024-04-23 ENCOUNTER — Encounter: Admitting: Sports Medicine

## 2024-04-27 ENCOUNTER — Ambulatory Visit: Admitting: Professional

## 2024-04-27 ENCOUNTER — Ambulatory Visit (INDEPENDENT_AMBULATORY_CARE_PROVIDER_SITE_OTHER): Admitting: Professional

## 2024-04-27 ENCOUNTER — Encounter: Payer: Self-pay | Admitting: Professional

## 2024-04-27 DIAGNOSIS — F331 Major depressive disorder, recurrent, moderate: Secondary | ICD-10-CM

## 2024-04-27 DIAGNOSIS — F4322 Adjustment disorder with anxiety: Secondary | ICD-10-CM

## 2024-04-27 NOTE — Progress Notes (Signed)
 [facility] Counselor/Therapist Progress Note  Patient ID: Connie Carlson, MRN: [id number],    Prefers to go by [name], her middle name  Date: 04/27/2024  Time Spent:  58 minutes [id number]   Treatment Type: Individual Therapy  Risk Assessment: Danger to Self:  No Self-injurious Behavior: No Danger to Others: No  Subjective: The patient arrived on time for her inperson appointment.   Issues addressed: 1-mood -pleasant and engaged 2-social a-had a great time at her birthday party -everyone she expected to attend did so b-spending time with coworker friend Nels c-thinking about future and possible opportunities for her life -learning what she wants and does not want in her life d-is unwilling to go back to the way her life in her previous relationship with Starr -no longer misses him or his children -she does not have anyone else to whom she is responsible 4-self-esteem -working on it -has always taken care of everyone else first -pt is now able to only take care of herself  Treatment Plan Problems Addressed  Career Success Obstacles, Depression, Low Self-Esteem/Lack of Assertiveness, Partner Relational Problems  Goals 1. Develop a sense of own personal power and self-esteem within the relationship. 2. Develop advocacy and empowerment skills that involve taking a proactive stance in the work environment. 3. Develop assertiveness skills and techniques for application in work-related activities (e.g., establishing boundaries with coworkers, Garment/textile technologist). 4. Develop healthy cognitive mechanisms to facilitate positive attitudes and beliefs about self within the context of one's environment to mitigate depressive symptoms. Objective Articulate signs and symptoms of depression in current life experiences. Target Date: 2024-11-03 Frequency: Biweekly  Progress: 70 Modality: individual  Related Interventions Encourage the client to share her feelings of  depression to gain an insight into precipitating events and implications of symptoms; normalize her feelings of depression. Objective Implement behavioral interventions to overcome depression. Target Date: 2024-11-03 Frequency: Biweekly  Progress: 30 Modality: individual  Related Interventions Assign the client to participate as fully as possible in a healthy exercise regimen. Encourage the client to continue participation in positive social support systems. Objective Identify and replace cognitive self-talk that supports depression. Target Date: 2024-11-03 Frequency: Biweekly  Progress: 30 Modality: individual  Related Interventions Reinforce the client's positive, reality-based cognitive messages that enhance self-confidence and increase adaptive action (see Positive Self-Talk in the Adult Psychotherapy Homework Planner, 2nd ed. by Jenniffer). Do behavioral experiments in which depressive automatic thoughts are treated as hypotheses/predictions, reality-based alternative hypotheses/ predictions are generated, and both are tested against the client's past, present, and/or future experiences. Encourage the client to discuss cognitive distortions, including automatic thoughts (e.g., negative view of self, future, experience) and negative schemas (e.g., core beliefs about self and others based on earlier childhood experiences); assess frequency of negative self-statements associated with depression. Objective Increase the frequency of engaging in pleasant activities. Target Date: 2024-11-03 Frequency: Biweekly  Progress: 30 Modality: individual  Related Interventions Assign the client reading materials regarding overcoming depression for women (e.g., Women and Depression: A Practical Self-Help Guide by Jarold; Feeling Good by Geofm; Women & Depression by Gweneth; Silencing the Self: Women and Depression by Marinell). Objective Identify precipitating events and factors of depression,  particularly roles of self and others in depression. Target Date: 2024-11-03 Frequency: Biweekly   Modality: individual  Related Interventions Explore the degree to which primary symptoms of depression are a result of gender role socialization (e.g., passivity, decreased self-esteem, learned helplessness, interpersonal orientation towards pleasing others). Evaluate symptoms to consider the presence of culture-bound syndromes  and/or culture specificity of symptoms (e.g., ataques de nervios, amok, postemigration effects). Objective Increase the level of physical exercise. Target Date: 2024-11-03 Frequency: Biweekly  Progress: 10 Modality: individual  Objective Increase social contacts and communicate needs within existing interpersonal relationships. Target Date: 2024-11-03 Frequency: Biweekly  Progress: 30 Modality: individual  5. Explore career options that have been automatically ruled out due to low self-concept or limited opportunities. Objective Identify and replace at least three negative self-talk statements that perpetuate feelings of powerlessness or hopelessness. Target Date: 2024-11-03 Frequency: Biweekly  Progress: 10 Modality: individual  Objective Identify and replace cognitive self-talk that contributes to all-or-nothing thinking, gender-role stereotypes, or low career self-efficacy. Target Date: 2024-11-03 Frequency: Biweekly  Progress: 10 Modality: individual  Objective Develop career-related strategies to overcome and persist in the face of obstacles. Target Date: 2024-11-03 Frequency: Biweekly  Progress: 30 Modality: individual  Objective Increase self-esteem and self-concept by exploring and encompassing other roles/identities besides those of career. Target Date: 2024-11-03 Frequency: Biweekly  Progress: 20 Modality: individual  Objective Identify barriers and personal benefits to balancing career and family life. Target Date: 2024-11-03 Frequency: Biweekly   Progress: 60 Modality: individual  6. Identify and increase intrapersonal, interpersonal, and physical resources to foster positive coping strategies. 7. Improve depressed mood to maximize effective social, occupational, and physical functioning. 8. Improve self-esteem and develop a positive self-image as capable and competent. 9. Increase ability to express needs and desires openly and honestly. 10. Increase assertiveness skills and ability to advocate for self. 11. Increase awareness of own role in relationship conflicts. Objective Identify a pattern of repeatedly forming destructive intimate relationships. Target Date: 2024-11-03 Frequency: Biweekly  Progress: 60 Modality: individual  12. Increase involvement in activities that foster confidence and a sense of accomplishment. Objective Acknowledge self-disparaging statements and recognize the tendency to engage in such statements. Target Date: 2024-11-03 Frequency: Biweekly  Progress: 60 Modality: individual  Related Interventions Ask the client to complete and process an exercise in the book Ten Days to Self-Esteem! [employer]). Objective Verbalize an understanding of the differences between passive, assertive, and aggressive behavior. Target Date: 2024-11-03 Frequency: Biweekly  Progress: 50 Modality: individual  Objective Describe personal history of self-devaluation and lack of assertiveness. Target Date: 2024-11-03 Frequency: Biweekly  Progress: 60 Modality: individual  Related Interventions Use cognitive-restructuring techniques to change the client's belief system (i.e., via reality-testing and rational thought) toward a more positive, realistic self-perception. Assist the client in becoming aware of how she indirectly expresses negative feelings about self (e.g., lack of eye contact, social withdrawal, sweating, expectations of failure or rejection). Assist the client in identifying negative beliefs about herself. Discuss  context of self-disparagement, including client's perceptions of self from childhood to present, role of caregivers and peers, and related experiences. Objective Identify three roadblocks to improved self-esteem. Target Date: 2024-11-03 Frequency: Biweekly  Progress: 0 Modality: individual  Objective   Diagnosis:Major depressive disorder, recurrent episode, moderate (HCC)  Adjustment disorder with anxiety  Plan: -meet again on Friday, May 25, 2024 at 8am in person

## 2024-05-11 ENCOUNTER — Ambulatory Visit: Admitting: Professional

## 2024-05-15 ENCOUNTER — Encounter: Payer: Self-pay | Admitting: Physical Therapy

## 2024-05-22 ENCOUNTER — Encounter: Payer: Self-pay | Admitting: Sports Medicine

## 2024-05-24 ENCOUNTER — Encounter: Payer: Self-pay | Admitting: Family Medicine

## 2024-05-24 ENCOUNTER — Other Ambulatory Visit: Payer: Self-pay

## 2024-05-24 ENCOUNTER — Ambulatory Visit
Admission: EM | Admit: 2024-05-24 | Discharge: 2024-05-24 | Disposition: A | Discharge status: Home / Self Care | Payer: Self-pay | Attending: Family Medicine | Admitting: Family Medicine

## 2024-05-24 DIAGNOSIS — H1132 Conjunctival hemorrhage, left eye: Secondary | ICD-10-CM

## 2024-05-24 DIAGNOSIS — Z8669 Personal history of other diseases of the nervous system and sense organs: Secondary | ICD-10-CM

## 2024-05-24 DIAGNOSIS — M25512 Pain in left shoulder: Secondary | ICD-10-CM

## 2024-05-24 DIAGNOSIS — R0789 Other chest pain: Secondary | ICD-10-CM

## 2024-05-24 DIAGNOSIS — I10 Essential (primary) hypertension: Secondary | ICD-10-CM

## 2024-05-24 HISTORY — DX: Migraine, unspecified, not intractable, without status migrainosus: G43.909

## 2024-05-24 HISTORY — DX: Depression, unspecified: F32.A

## 2024-05-24 MED ORDER — NAPROXEN SODIUM 550 MG PO TABS: 550.0000 mg | ORAL_TABLET | Freq: Two times a day (BID) | ORAL | 0 refills | Status: DC

## 2024-05-24 MED ORDER — CYCLOBENZAPRINE HCL 5 MG PO TABS
ORAL_TABLET | ORAL | 0 refills | Status: AC
Start: 2024-05-24 — End: ?

## 2024-05-24 NOTE — Discharge Instructions (Signed)
 Take Anaprox  2 times a day.  This is an anti-inflammatory medicine to take down muscle soreness Take the muscle relaxer at bedtime Use ice or heat to your painful shoulder muscles Continue to keep track of your blood pressure and report to your family doctor Your EKG and heart exam are normal

## 2024-05-24 NOTE — ED Triage Notes (Addendum)
 Headache, left neck, left shoulder pain since Monday. Checked blood pressure that night and it was high. Has sunconjunctival hemorrhage to left eye, noticed when woke up Tuesday morning. Reports blood pressure has been high. Has had advil, which helped her headache. Has hx of migraines, for which she is prescribed ubrelvy  for. She took this last night and it resolved her headache and she was able to sleep through the night.

## 2024-05-24 NOTE — Telephone Encounter (Signed)
 Spoke with patient. States she does have headache, light headedness, left sided chest pain with cough or deep inhalation -patient is not taking any cough medication but did start new migraine medication from dr. Alvan.-  Spoke with Dr. Alvan who recommended patient be seen at urgent care for evaluation as no availability in our office today.  Patient is agreeable to urgent care and will go there now.

## 2024-05-24 NOTE — ED Provider Notes (Signed)
 Connie Carlson    CSN: [id number] Arrival date & time: 05/24/24  1301      History   Chief Complaint No chief complaint on file.   HPI Connie Carlson is a 43 y.o. female.   Patient has multiple observations.  She states that her blood pressure has been higher than usual the last couple of days.  She increased her blood pressure medicine to twice a day instead of once a day and her blood pressure has come down.  When her blood pressure was high she had a headache.  She took her migraine medicine for the headache and this improved.  She also has pain in her left shoulder and left chest.  It is occurred to her but that the pain in her left chest might be cardiac.  This is her biggest concern.  The pain is worse with movement and deep breath.  There is no exertional pain.  No palpitations.  No dizziness or nausea.  The pain goes up into her shoulder and she points to the left trapezius region in general.  No recent cough or shortness of breath.  She also noticed yesterday that she had redness in her eye.  Denies any trauma.  Denies any discomfort.  Denies any trouble with vision    Past Medical History:  Diagnosis Date   Depression    High blood pressure    Migraine     Patient Active Problem List   Diagnosis Date Noted   Other insomnia 03/30/2023   Generalized obesity 03/30/2023   BMI 37.0-37.9, adult 03/30/2023   Elevated glucose 03/30/2023   Vitamin D  deficiency 03/30/2023   Health Carlson maintenance 03/30/2023   SOB (shortness of breath) on exertion 03/30/2023   Other fatigue 03/30/2023   Major depressive disorder, recurrent episode, mild (HCC) 08/26/2022   Stress and adjustment reaction 06/29/2022   Anal fissure 01/13/2022   Depression, recurrent (HCC) 04/07/2021   Hypertension 06/11/2020   Class 3 severe obesity with serious comorbidity and body mass index (BMI) of 40.0 to 44.9 in adult 06/11/2020   IFG (impaired fasting glucose) 10/08/2013   Migraine headache  without aura 02/16/2013   WEIGHT GAIN 01/01/2009   Allergic rhinitis 06/28/2006   GASTROESOPHAGEAL REFLUX, NO ESOPHAGITIS 06/28/2006    Past Surgical History:  Procedure Laterality Date   CHOLECYSTECTOMY     mole remove     under right breast by [facility] - skin cancer    OB History     Gravida  1   Para  1   Term  1   Preterm      AB      Living  1      SAB      IAB      Ectopic      Multiple      Live Births               Home Medications    Prior to Admission medications   Medication Sig Start Date End Date Taking? Authorizing Provider  cyclobenzaprine  (FLEXERIL ) 5 MG tablet Take at bedtime.  This is a mild muscle relaxer.  May take once during the day if needed.  Caution drowsiness 05/24/24  Yes Maranda Jamee Jacob, MD  naproxen  sodium (ANAPROX  DS) 550 MG tablet Take 1 tablet (550 mg total) by mouth 2 (two) times daily with a meal. 05/24/24  Yes Maranda Jamee Jacob, MD  DULoxetine  (CYMBALTA ) 60 MG capsule Take 1 capsule (60 mg  total) by mouth daily. 01/12/24   Alvan Dorothyann BIRCH, MD  Levonorgestrel -Ethinyl Estradiol  (SIMPESSE) 0.15-0.03 &0.01 MG tablet Take 1 tablet by mouth daily. Patient not taking: Reported on 04/11/2024 06/28/23   Alvan Dorothyann BIRCH, MD  Ubrogepant  (UBRELVY ) 50 MG TABS Take 1 tablet (50 mg total) by mouth daily as needed. May repeat dose after 1 hr. 01/12/24   Alvan Dorothyann BIRCH, MD  valsartan -hydrochlorothiazide  (DIOVAN -HCT) 160-12.5 MG tablet Take 1 tablet by mouth daily. 01/12/24   Alvan Dorothyann BIRCH, MD    Family History Family History  Problem Relation Age of Onset   Obesity Mother    Diabetes Other     Social History Social History   Tobacco Use   Smoking status: Some Days    Types: Cigarettes   Smokeless tobacco: Never  Vaping Use   Vaping status: Never Used  Substance Use Topics   Alcohol use: No   Drug use: No     Allergies   Patient has no known allergies.   Review of Systems Review  of Systems See HPI  Physical Exam Triage Vital Signs ED Triage Vitals  Encounter Vitals Group     BP 05/24/24 1313 138/79     Girls Systolic BP Percentile --      Girls Diastolic BP Percentile --      Boys Systolic BP Percentile --      Boys Diastolic BP Percentile --      Pulse Rate 05/24/24 1313 (!) 101     Resp 05/24/24 1313 (!) 22     Temp 05/24/24 1313 98.5 F (36.9 C)     Temp Source 05/24/24 1313 Oral     SpO2 05/24/24 1313 95 %     Weight --      Height --      Head Circumference --      Peak Flow --      Pain Score 05/24/24 1318 6     Pain Loc --      Pain Education --      Exclude from Growth Chart --    No data found.  Updated Vital Signs BP 138/79   Pulse (!) 101   Temp 98.5 F (36.9 C) (Oral)   Resp (!) 22   LMP 05/04/2024 (Approximate)   SpO2 95%       Physical Exam Constitutional:      General: She is not in acute distress.    Appearance: She is well-developed.     Comments: Patient appears well  HENT:     Head: Normocephalic and atraumatic.     Nose: Nose normal. No congestion.     Mouth/Throat:     Mouth: Mucous membranes are moist.     Pharynx: No posterior oropharyngeal erythema.  Eyes:     Conjunctiva/sclera: Conjunctivae normal.     Pupils: Pupils are equal, round, and reactive to light.  Neck:     Comments: Tenderness to palpation of the left upper body of the trapezius.  No tenderness in the shoulder, full range of motion of shoulder. Cardiovascular:     Rate and Rhythm: Normal rate and regular rhythm.     Heart sounds: Normal heart sounds.  Pulmonary:     Effort: Pulmonary effort is normal. No respiratory distress.     Breath sounds: Normal breath sounds.  Chest:     Chest wall: No tenderness.  Abdominal:     General: There is no distension.     Palpations: Abdomen is soft.  Musculoskeletal:        General: Normal range of motion.     Cervical back: Normal range of motion.  Lymphadenopathy:     Cervical: No cervical  adenopathy.  Skin:    General: Skin is warm and dry.  Neurological:     Mental Status: She is alert.      UC Treatments / Results  Labs (all labs ordered are listed, but only abnormal results are displayed) Labs Reviewed - No data to display  EKG EKG is normal.  Normal rate and rhythm.  No ST or T wave changes.  Radiology No results found.  Procedures Procedures (including critical Carlson time)  Medications Ordered in UC Medications - No data to display  Initial Impression / Assessment and Plan / UC Course  I have reviewed the triage vital signs and the nursing notes.  Pertinent labs & imaging results that were available during my Carlson of the patient were reviewed by me and considered in my medical decision making (see chart for details).     I explained to the patient that her left-sided chest and shoulder pain are musculoskeletal The left conjunctival injection is a subconjunctival hemorrhage that is self-limited and again, not dangerous Her blood pressure is well-managed.  I advised her not to change her blood pressure medicine without discussing it with her personal physician  Final Clinical Impressions(s) / UC Diagnoses   Final diagnoses:  Acute chest wall pain  Acute pain of left shoulder  Essential hypertension  History of migraine  Subconjunctival hemorrhage of left eye     Discharge Instructions      Take Anaprox  2 times a day.  This is an anti-inflammatory medicine to take down muscle soreness Take the muscle relaxer at bedtime Use ice or heat to your painful shoulder muscles Continue to keep track of your blood pressure and report to your family doctor Your EKG and heart exam are normal   ED Prescriptions     Medication Sig Dispense Auth. Provider   naproxen  sodium (ANAPROX  DS) 550 MG tablet Take 1 tablet (550 mg total) by mouth 2 (two) times daily with a meal. 30 tablet Maranda Jamee Jacob, MD   cyclobenzaprine  (FLEXERIL ) 5 MG tablet Take at  bedtime.  This is a mild muscle relaxer.  May take once during the day if needed.  Caution drowsiness 30 tablet Maranda Jamee Jacob, MD      PDMP not reviewed this encounter.   Maranda Jamee Jacob, MD 05/24/24 [phone]

## 2024-05-25 ENCOUNTER — Ambulatory Visit: Payer: Self-pay | Admitting: Professional

## 2024-05-28 ENCOUNTER — Telehealth: Payer: Self-pay | Admitting: *Deleted

## 2024-05-28 NOTE — Telephone Encounter (Signed)
 Returned call from 10:50 AM. Left patient a message to call and schedule.

## 2024-05-29 NOTE — Progress Notes (Unsigned)
   RETURN GYNECOLOGY VISIT  Subjective:  Connie Carlson is a 43 y.o. G1P1001 with LMP 05/04/24 presenting for follow up of chronic myofascial pelvic pain and SUI  I have been following with this patient for 1 year for chronic pelvic pain that is likely myofascial in origin. She has been on disability as her job at [employer] requires lifting and they are unable to accept any lifting restrictions. She has been working diligently with PFPT and last appointment our plan was for referral to urogyn and sports medicine.   She is in the process of separating from her husband. Since our last visit, there was a lapse in her insurance and she was not able to afford the OOP costs for PFPT or sports medicine. She has an appointment with Urogyn scheduled on 11/4.   In the meantime, she has been walking, using pelvic wand and doing home exercises from PFPT. Her pain is still daily, comes and goes. She denies nausea, vomiting, diarrhea, constipation, unintentional weight loss.   Objective:   Vitals:   05/31/24 0851  BP: 132/82  Pulse: 92  Weight: 244 lb 1.3 oz (110.7 kg)  Height: 5' 1 (1.549 m)   General:  Alert, oriented and cooperative. Patient is in no acute distress.  Skin: Skin is warm and dry. No rash noted.   Cardiovascular: Normal heart rate noted  Respiratory: Normal respiratory effort, no problems with respiration noted   Assessment and Plan:  Connie Carlson is a 43 y.o. with chronic pelvic pain due to pelvic floor dysfunction and SUI  Chronic pelvic pain, pelvic floor dysfunction - Will renew disability x 8 weeks to allow patient to meet with sports medicine and continue working with PFPT now that insurance has be reestablished - Normal pelvic exam 04/11/24, no new symptoms, no current e/o GI etiology but recommend PCP follow up to discuss any other potential etiologies - Sports medicine referral re-sent to new provider  2. SUI - Unable to tolerate pessary placement in July - Follow up with  urogyn as scheduled in November  Return in about 8 weeks (around 07/26/2024) for follow up pelvic pain.  Future Appointments  Date Time Provider Department Center  06/08/2024  8:00 AM [facility], [facility] None  07/06/2024  8:00 AM Gerome Service, [facility] None  07/20/2024  8:00 AM Gerome Service, [facility] None  07/24/2024  1:00 PM Marilynne Rosaline SAILOR, MD [facility]  08/03/2024  8:00 AM Gerome Service, [facility] None    Kieth JAYSON Carolin, MD

## 2024-05-31 ENCOUNTER — Ambulatory Visit (INDEPENDENT_AMBULATORY_CARE_PROVIDER_SITE_OTHER): Payer: Self-pay | Admitting: Obstetrics and Gynecology

## 2024-05-31 ENCOUNTER — Encounter: Payer: Self-pay | Admitting: Obstetrics and Gynecology

## 2024-05-31 VITALS — BP 132/82 | HR 92 | Ht 61.0 in | Wt 244.1 lb

## 2024-05-31 DIAGNOSIS — R102 Pelvic and perineal pain: Secondary | ICD-10-CM

## 2024-05-31 DIAGNOSIS — G8929 Other chronic pain: Secondary | ICD-10-CM

## 2024-06-08 ENCOUNTER — Encounter: Payer: Self-pay | Admitting: Professional

## 2024-06-08 ENCOUNTER — Ambulatory Visit: Payer: Self-pay | Admitting: Professional

## 2024-06-08 DIAGNOSIS — F4322 Adjustment disorder with anxiety: Secondary | ICD-10-CM

## 2024-06-08 DIAGNOSIS — F331 Major depressive disorder, recurrent, moderate: Secondary | ICD-10-CM | POA: Diagnosis not present

## 2024-06-08 NOTE — Progress Notes (Signed)
 [facility] Counselor/Therapist Progress Note  Patient ID: Connie Carlson, MRN: [id number],    Prefers to go by [name], her middle name  Date: 06/08/2024  Time Spent:  52 minutes 807-859am   Treatment Type: Individual Therapy  Risk Assessment: Danger to Self:  No Self-injurious Behavior: No Danger to Others: No  Subjective: The patient arrived late for her inperson appointment.   Issues addressed: 1-professional -pt has not returned to work and remains on a medical leave of absence -she was told by [employer] that she cannot return until she has no restrictions 2-missed appointments -first missed was insurance issue -2nd missed was due to illness -pt aware of need to keep appointments 3-mood -has not been crying 06/08/2024    PHQ2-9 Depression Screening   Little interest or pleasure in doing things More than half the days  Feeling down, depressed, or hopeless Several days  PHQ-2 - Total Score 3  Trouble falling or staying asleep, or sleeping too much Not at all  Feeling tired or having little energy Nearly every day  Poor appetite or overeating  Nearly every day  Feeling bad about yourself - or that you are a failure or have let yourself or your family down Nearly every day  Trouble concentrating on things, such as reading the newspaper or watching television Not at all  Moving or speaking so slowly that other people could have noticed.  Or the opposite - being so fidgety or restless that you have been moving around a lot more than usual Not at all  Thoughts that you would be better off dead, or hurting yourself in some way Not at all  PHQ2-9 Total Score 12  If you checked off any problems, how difficult have these problems made it for you to do your work, take care of things at home, or get along with other people Not difficult at all  Depression Interventions/Treatment Counseling, Medication  4-socially isolated -how to get involved -negative  outcomes  Treatment Plan Problems Addressed  Career Success Obstacles, Depression, Low Self-Esteem/Lack of Assertiveness, Partner Relational Problems  Goals 1. Develop a sense of own personal power and self-esteem within the relationship. 2. Develop advocacy and empowerment skills that involve taking a proactive stance in the work environment. 3. Develop assertiveness skills and techniques for application in work-related activities (e.g., establishing boundaries with coworkers, Garment/textile technologist). 4. Develop healthy cognitive mechanisms to facilitate positive attitudes and beliefs about self within the context of one's environment to mitigate depressive symptoms. Objective Articulate signs and symptoms of depression in current life experiences. Target Date: 2024-11-03 Frequency: Biweekly  Progress: 70 Modality: individual  Related Interventions Encourage the client to share her feelings of depression to gain an insight into precipitating events and implications of symptoms; normalize her feelings of depression. Objective Implement behavioral interventions to overcome depression. Target Date: 2024-11-03 Frequency: Biweekly  Progress: 30 Modality: individual  Related Interventions Assign the client to participate as fully as possible in a healthy exercise regimen. Encourage the client to continue participation in positive social support systems. Objective Identify and replace cognitive self-talk that supports depression. Target Date: 2024-11-03 Frequency: Biweekly  Progress: 30 Modality: individual  Related Interventions Reinforce the client's positive, reality-based cognitive messages that enhance self-confidence and increase adaptive action (see Positive Self-Talk in the Adult Psychotherapy Homework Planner, 2nd ed. by Jenniffer). Do behavioral experiments in which depressive automatic thoughts are treated as hypotheses/predictions, reality-based alternative hypotheses/ predictions  are generated, and both are tested against the client's past, present,  and/or future experiences. Encourage the client to discuss cognitive distortions, including automatic thoughts (e.g., negative view of self, future, experience) and negative schemas (e.g., core beliefs about self and others based on earlier childhood experiences); assess frequency of negative self-statements associated with depression. Objective Increase the frequency of engaging in pleasant activities. Target Date: 2024-11-03 Frequency: Biweekly  Progress: 30 Modality: individual  Related Interventions Assign the client reading materials regarding overcoming depression for women (e.g., Women and Depression: A Practical Self-Help Guide by Jarold; Feeling Good by Geofm; Women & Depression by Gweneth; Silencing the Self: Women and Depression by Marinell). Objective Identify precipitating events and factors of depression, particularly roles of self and others in depression. Target Date: 2024-11-03 Frequency: Biweekly   Modality: individual  Related Interventions Explore the degree to which primary symptoms of depression are a result of gender role socialization (e.g., passivity, decreased self-esteem, learned helplessness, interpersonal orientation towards pleasing others). Evaluate symptoms to consider the presence of culture-bound syndromes and/or culture specificity of symptoms (e.g., ataques de nervios, amok, postemigration effects). Objective Increase the level of physical exercise. Target Date: 2024-11-03 Frequency: Biweekly  Progress: 10 Modality: individual  Objective Increase social contacts and communicate needs within existing interpersonal relationships. Target Date: 2024-11-03 Frequency: Biweekly  Progress: 30 Modality: individual  5. Explore career options that have been automatically ruled out due to low self-concept or limited opportunities. Objective Identify and replace at least three negative self-talk  statements that perpetuate feelings of powerlessness or hopelessness. Target Date: 2024-11-03 Frequency: Biweekly  Progress: 10 Modality: individual  Objective Identify and replace cognitive self-talk that contributes to all-or-nothing thinking, gender-role stereotypes, or low career self-efficacy. Target Date: 2024-11-03 Frequency: Biweekly  Progress: 10 Modality: individual  Objective Develop career-related strategies to overcome and persist in the face of obstacles. Target Date: 2024-11-03 Frequency: Biweekly  Progress: 30 Modality: individual  Objective Increase self-esteem and self-concept by exploring and encompassing other roles/identities besides those of career. Target Date: 2024-11-03 Frequency: Biweekly  Progress: 20 Modality: individual  Objective Identify barriers and personal benefits to balancing career and family life. Target Date: 2024-11-03 Frequency: Biweekly  Progress: 60 Modality: individual  6. Identify and increase intrapersonal, interpersonal, and physical resources to foster positive coping strategies. 7. Improve depressed mood to maximize effective social, occupational, and physical functioning. 8. Improve self-esteem and develop a positive self-image as capable and competent. 9. Increase ability to express needs and desires openly and honestly. 10. Increase assertiveness skills and ability to advocate for self. 11. Increase awareness of own role in relationship conflicts. Objective Identify a pattern of repeatedly forming destructive intimate relationships. Target Date: 2024-11-03 Frequency: Biweekly  Progress: 60 Modality: individual  12. Increase involvement in activities that foster confidence and a sense of accomplishment. Objective Acknowledge self-disparaging statements and recognize the tendency to engage in such statements. Target Date: 2024-11-03 Frequency: Biweekly  Progress: 60 Modality: individual  Related Interventions Ask the client to  complete and process an exercise in the book Ten Days to Self-Esteem! [employer]). Objective Verbalize an understanding of the differences between passive, assertive, and aggressive behavior. Target Date: 2024-11-03 Frequency: Biweekly  Progress: 50 Modality: individual  Objective Describe personal history of self-devaluation and lack of assertiveness. Target Date: 2024-11-03 Frequency: Biweekly  Progress: 60 Modality: individual  Related Interventions Use cognitive-restructuring techniques to change the client's belief system (i.e., via reality-testing and rational thought) toward a more positive, realistic self-perception. Assist the client in becoming aware of how she indirectly expresses negative feelings about self (e.g., lack of eye contact, social  withdrawal, sweating, expectations of failure or rejection). Assist the client in identifying negative beliefs about herself. Discuss context of self-disparagement, including client's perceptions of self from childhood to present, role of caregivers and peers, and related experiences. Objective Identify three roadblocks to improved self-esteem. Target Date: 2024-11-03 Frequency: Biweekly  Progress: 0 Modality: individual  Objective   Diagnosis:Major depressive disorder, recurrent episode, moderate (HCC)  Adjustment disorder with anxiety  Plan: -email Dr. Alvan re: increase medication -meet again on Friday, July 20, 2024 at 8am in person

## 2024-06-18 ENCOUNTER — Encounter: Payer: Self-pay | Admitting: Family Medicine

## 2024-06-18 DIAGNOSIS — F330 Major depressive disorder, recurrent, mild: Secondary | ICD-10-CM

## 2024-06-18 MED ORDER — DULOXETINE HCL 30 MG PO CPEP: ORAL_CAPSULE | ORAL | 2 refills | Status: AC

## 2024-06-19 ENCOUNTER — Other Ambulatory Visit: Payer: Self-pay | Admitting: Family Medicine

## 2024-06-19 DIAGNOSIS — I10 Essential (primary) hypertension: Secondary | ICD-10-CM

## 2024-06-19 DIAGNOSIS — N921 Excessive and frequent menstruation with irregular cycle: Secondary | ICD-10-CM

## 2024-06-21 NOTE — Telephone Encounter (Signed)
 Pt will need f/u and labs for refills. Please call to schedule

## 2024-07-06 ENCOUNTER — Ambulatory Visit: Admitting: Professional

## 2024-07-20 ENCOUNTER — Other Ambulatory Visit: Payer: Self-pay | Admitting: Family Medicine

## 2024-07-20 ENCOUNTER — Encounter: Payer: Self-pay | Admitting: Professional

## 2024-07-20 ENCOUNTER — Ambulatory Visit (INDEPENDENT_AMBULATORY_CARE_PROVIDER_SITE_OTHER): Admitting: Professional

## 2024-07-20 DIAGNOSIS — G43009 Migraine without aura, not intractable, without status migrainosus: Secondary | ICD-10-CM

## 2024-07-20 DIAGNOSIS — F331 Major depressive disorder, recurrent, moderate: Secondary | ICD-10-CM

## 2024-07-20 DIAGNOSIS — F4322 Adjustment disorder with anxiety: Secondary | ICD-10-CM

## 2024-07-20 MED ORDER — UBRELVY 50 MG PO TABS: 50.0000 mg | ORAL_TABLET | Freq: Every day | ORAL | 11 refills | Status: AC | PRN

## 2024-07-20 NOTE — Progress Notes (Signed)
 [facility] Counselor/Therapist Progress Note  Patient ID: Connie Carlson, MRN: [id number],    Prefers to go by [name], her middle name  Date: 07/20/2024  Time Spent:  50 minutes 805-855am   Treatment Type: Individual Therapy  Risk Assessment: Danger to Self:  No Self-injurious Behavior: No Danger to Others: No  Subjective: The patient arrived late for her inperson appointment.   Issues addressed: 1-professional -pt continues to be off on medical leave -she admits that she is uncertain that she wants to return to [employer] for several reasons, one being the weight she has gained, her boss' inflexibility -pt questioning what she wants for the future 2-physical health -pt still experiences a significant amount of pain -during session she had to pause to breath her way through pain -she has had increased bleeding since she has not been able to get in to PT -sees MD next week 3-career -pt has been considering a career vs a job -she would like to work with children -educated on how to explore -discussed opportunities for scholarships -alternative employment if in school FT such as substitute teaching  Treatment Plan Problems Addressed  Career Success Obstacles, Depression, Low Self-Esteem/Lack of Assertiveness, Partner Relational Problems  Goals 1. Develop a sense of own personal power and self-esteem within the relationship. 2. Develop advocacy and empowerment skills that involve taking a proactive stance in the work environment. 3. Develop assertiveness skills and techniques for application in work-related activities (e.g., establishing boundaries with coworkers, garment/textile technologist). 4. Develop healthy cognitive mechanisms to facilitate positive attitudes and beliefs about self within the context of one's environment to mitigate depressive symptoms. Objective Articulate signs and symptoms of depression in current life experiences. Target Date: 2024-11-03 Frequency:  Biweekly  Progress: 70 Modality: individual  Related Interventions Encourage the client to share her feelings of depression to gain an insight into precipitating events and implications of symptoms; normalize her feelings of depression. Objective Implement behavioral interventions to overcome depression. Target Date: 2024-11-03 Frequency: Biweekly  Progress: 30 Modality: individual  Related Interventions Assign the client to participate as fully as possible in a healthy exercise regimen. Encourage the client to continue participation in positive social support systems. Objective Identify and replace cognitive self-talk that supports depression. Target Date: 2024-11-03 Frequency: Biweekly  Progress: 30 Modality: individual  Related Interventions Reinforce the client's positive, reality-based cognitive messages that enhance self-confidence and increase adaptive action (see Positive Self-Talk in the Adult Psychotherapy Homework Planner, 2nd ed. by Jenniffer). Do behavioral experiments in which depressive automatic thoughts are treated as hypotheses/predictions, reality-based alternative hypotheses/ predictions are generated, and both are tested against the client's past, present, and/or future experiences. Encourage the client to discuss cognitive distortions, including automatic thoughts (e.g., negative view of self, future, experience) and negative schemas (e.g., core beliefs about self and others based on earlier childhood experiences); assess frequency of negative self-statements associated with depression. Objective Increase the frequency of engaging in pleasant activities. Target Date: 2024-11-03 Frequency: Biweekly  Progress: 30 Modality: individual  Related Interventions Assign the client reading materials regarding overcoming depression for women (e.g., Women and Depression: A Practical Self-Help Guide by Jarold; Feeling Good by Geofm; Women & Depression by Gweneth; Silencing the  Self: Women and Depression by Marinell). Objective Identify precipitating events and factors of depression, particularly roles of self and others in depression. Target Date: 2024-11-03 Frequency: Biweekly   Modality: individual  Related Interventions Explore the degree to which primary symptoms of depression are a result of gender role socialization (e.g., passivity,  decreased self-esteem, learned helplessness, interpersonal orientation towards pleasing others). Evaluate symptoms to consider the presence of culture-bound syndromes and/or culture specificity of symptoms (e.g., ataques de nervios, amok, postemigration effects). Objective Increase the level of physical exercise. Target Date: 2024-11-03 Frequency: Biweekly  Progress: 10 Modality: individual  Objective Increase social contacts and communicate needs within existing interpersonal relationships. Target Date: 2024-11-03 Frequency: Biweekly  Progress: 30 Modality: individual  5. Explore career options that have been automatically ruled out due to low self-concept or limited opportunities. Objective Identify and replace at least three negative self-talk statements that perpetuate feelings of powerlessness or hopelessness. Target Date: 2024-11-03 Frequency: Biweekly  Progress: 10 Modality: individual  Objective Identify and replace cognitive self-talk that contributes to all-or-nothing thinking, gender-role stereotypes, or low career self-efficacy. Target Date: 2024-11-03 Frequency: Biweekly  Progress: 10 Modality: individual  Objective Develop career-related strategies to overcome and persist in the face of obstacles. Target Date: 2024-11-03 Frequency: Biweekly  Progress: 30 Modality: individual  Objective Increase self-esteem and self-concept by exploring and encompassing other roles/identities besides those of career. Target Date: 2024-11-03 Frequency: Biweekly  Progress: 20 Modality: individual  Objective Identify barriers and  personal benefits to balancing career and family life. Target Date: 2024-11-03 Frequency: Biweekly  Progress: 60 Modality: individual  6. Identify and increase intrapersonal, interpersonal, and physical resources to foster positive coping strategies. 7. Improve depressed mood to maximize effective social, occupational, and physical functioning. 8. Improve self-esteem and develop a positive self-image as capable and competent. 9. Increase ability to express needs and desires openly and honestly. 10. Increase assertiveness skills and ability to advocate for self. 11. Increase awareness of own role in relationship conflicts. Objective Identify a pattern of repeatedly forming destructive intimate relationships. Target Date: 2024-11-03 Frequency: Biweekly  Progress: 60 Modality: individual  12. Increase involvement in activities that foster confidence and a sense of accomplishment. Objective Acknowledge self-disparaging statements and recognize the tendency to engage in such statements. Target Date: 2024-11-03 Frequency: Biweekly  Progress: 60 Modality: individual  Related Interventions Ask the client to complete and process an exercise in the book Ten Days to Self-Esteem! [employer]). Objective Verbalize an understanding of the differences between passive, assertive, and aggressive behavior. Target Date: 2024-11-03 Frequency: Biweekly  Progress: 50 Modality: individual  Objective Describe personal history of self-devaluation and lack of assertiveness. Target Date: 2024-11-03 Frequency: Biweekly  Progress: 60 Modality: individual  Related Interventions Use cognitive-restructuring techniques to change the client's belief system (i.e., via reality-testing and rational thought) toward a more positive, realistic self-perception. Assist the client in becoming aware of how she indirectly expresses negative feelings about self (e.g., lack of eye contact, social withdrawal, sweating, expectations of  failure or rejection). Assist the client in identifying negative beliefs about herself. Discuss context of self-disparagement, including client's perceptions of self from childhood to present, role of caregivers and peers, and related experiences. Objective Identify three roadblocks to improved self-esteem. Target Date: 2024-11-03 Frequency: Biweekly  Progress: 0 Modality: individual  Objective   Diagnosis:Major depressive disorder, recurrent episode, moderate (HCC)  Adjustment disorder with anxiety  Plan: -explore opportunities for career or choice -meet again on Friday, August 03, 2024 at 8am in person

## 2024-07-20 NOTE — Telephone Encounter (Signed)
 Copied from CRM #[id number]. Topic: Clinical - Medication Refill >> Jul 20, 2024 10:41 AM Edsel HERO wrote: Medication: Ubrogepant  (UBRELVY ) 50 MG TABS  Has the patient contacted their pharmacy? No  This is the patient's preferred pharmacy:  [facility] [address] [place], [place] - [address] [facility] [address] [place] [address] Phone: [phone] Fax: [phone]  Is this the correct pharmacy for this prescription? Yes If no, delete pharmacy and type the correct one.   Has the prescription been filled recently? Yes  Is the patient out of the medication? Yes  Has the patient been seen for an appointment in the last year OR does the patient have an upcoming appointment? Yes  Can we respond through MyChart? Yes

## 2024-07-24 ENCOUNTER — Encounter: Payer: Self-pay | Admitting: Obstetrics and Gynecology

## 2024-07-24 ENCOUNTER — Other Ambulatory Visit (HOSPITAL_COMMUNITY): Payer: Self-pay

## 2024-07-24 ENCOUNTER — Ambulatory Visit: Admitting: Obstetrics and Gynecology

## 2024-07-24 ENCOUNTER — Telehealth: Payer: Self-pay

## 2024-07-24 VITALS — BP 144/84 | HR 95 | Ht 61.0 in | Wt 243.0 lb

## 2024-07-24 DIAGNOSIS — R350 Frequency of micturition: Secondary | ICD-10-CM | POA: Insufficient documentation

## 2024-07-24 DIAGNOSIS — M62838 Other muscle spasm: Secondary | ICD-10-CM | POA: Diagnosis not present

## 2024-07-24 DIAGNOSIS — N393 Stress incontinence (female) (male): Secondary | ICD-10-CM | POA: Insufficient documentation

## 2024-07-24 LAB — POCT URINALYSIS DIP (CLINITEK)
Bilirubin, UA: NEGATIVE
Glucose, UA: NEGATIVE mg/dL
Ketones, POC UA: NEGATIVE mg/dL
Leukocytes, UA: NEGATIVE
Nitrite, UA: NEGATIVE
POC PROTEIN,UA: NEGATIVE
Spec Grav, UA: 1.02 (ref 1.010–1.025)
Urobilinogen, UA: 0.2 U/dL
pH, UA: 6.5 (ref 5.0–8.0)

## 2024-07-24 MED ORDER — DIAZEPAM 5 MG PO TABS: ORAL_TABLET | ORAL | 0 refills | Status: DC

## 2024-07-24 NOTE — Assessment & Plan Note (Signed)
-   For treatment of stress urinary incontinence,  non-surgical options include expectant management, weight loss, physical therapy, as well as a pessary.  Surgical options include a midurethral sling, and transurethral injection of a bulking agent. - Due to pelvic pain, I do not think she would be a good candidate for a pessary or a sling. But she wants to consider urethral bulking. Handout provided for bulkamid. If she wants to proceed, will need simple CMG to demonstrate leakage.

## 2024-07-24 NOTE — Assessment & Plan Note (Signed)
-   We discussed the symptoms of overactive bladder (OAB), which include urinary urgency, urinary frequency, nocturia, with or without urge incontinence.  While we do not know the exact etiology of OAB, several treatment options exist. We discussed management including behavioral therapy (decreasing bladder irritants, urge suppression strategies, timed voids, bladder retraining), physical therapy, medication.  - Advised her to reduce water intake to closer to 60oz and avoid caffeine.

## 2024-07-24 NOTE — Progress Notes (Deleted)
 W patient

## 2024-07-24 NOTE — Patient Instructions (Addendum)
 Today we talked about ways to manage bladder urgency such as altering your diet to avoid irritative beverages and foods (bladder diet) as well as attempting to decrease stress and other exacerbating factors.    The Most Bothersome Foods* The Least Bothersome Foods*  Coffee - Regular & Decaf Tea - caffeinated Carbonated beverages - cola, non-colas, diet & caffeine-free Alcohols - Beer, Red Wine, White Wine, [address] - Grapefruit, [place], Orange, [employer] - Cranberry, Grapefruit, Orange, Pineapple Vegetables - Tomato & Tomato Products Flavor Enhancers - Hot peppers, Spicy foods, Chili, Horseradish, Vinegar, Monosodium glutamate (MSG) Artificial Sweeteners - NutraSweet, Sweet 'N Low, Equal (sweetener), Saccharin Ethnic foods - Mexican, Thai, Indian food [employer] - low-fat & whole Fruits - Bananas, Blueberries, Honeydew melon, Pears, Raisins, Watermelon Vegetables - Broccoli, [address] Sprouts, [place], Carrots, Cauliflower, [place], Cucumber, Mushrooms, Peas, Radishes, Squash, Zucchini, White potatoes, Sweet potatoes & yams Poultry - Chicken, Eggs, Turkey, [employer] - Beef, Diplomatic Services Operational Officer, Lamb Seafood - Shrimp, [place] fish, Salmon Grains - Oat, Rice Snacks - Pretzels, Popcorn  *Mitch ALF et al. Diet and its role in interstitial cystitis/bladder pain syndrome (IC/BPS) and comorbid conditions. [employer]U [employer]. BJU Int. 2012 Jan 11.   I will prescribe valium 5 mg pills to place vaginally up to 2 times a day for vaginal muscle spasms. Start at night and take one every night for the next several weeks to see if it improves your symptoms. Once you are improving you can taper off the medication and just use as needed. If the medication makes you drowsy then only use at bedtime and/or we can reduce the dose. Do not use gel to place it, as that will prevent the tablet from dissolving.  Instead place 1-2 drops of water on the table before inserting it in the vagina. If the pills do not dissolve  well we can switch to a special compounded suppository. Let me know how you are doing on the medication and if you have any questions.

## 2024-07-24 NOTE — Telephone Encounter (Signed)
 Pharmacy Patient Advocate Encounter   Received notification from CoverMyMeds that prior authorization for Ubrelvy  50mg  tabs is required/requested.   Insurance verification completed.   The patient is insured through [employer].   Per test claim: PA required; PA submitted to above mentioned insurance via Latent Key/confirmation #/EOC [id number] Status is pending
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
## 2024-07-24 NOTE — Assessment & Plan Note (Signed)
-   The origin of pelvic floor muscle spasm can be multifactorial, including primary, reactive to a different pain source, trauma, or even part of a centralized pain syndrome.Treatment options include pelvic floor physical therapy, local (vaginal) or oral  muscle relaxants, pelvic muscle trigger point injections or centrally acting pain medications.   - Prescribed vaginal valium- use nightly for a week then as needed after.  - She is restarting pelvic PT

## 2024-07-25 ENCOUNTER — Other Ambulatory Visit (HOSPITAL_COMMUNITY): Payer: Self-pay

## 2024-07-25 NOTE — Telephone Encounter (Signed)
 Pharmacy Patient Advocate Encounter  Received notification from [facility] that Prior Authorization for Ubrelvy  50mg  tabs has been APPROVED from 07/24/24 to 09/19/2221. Ran test claim, Copay is $0. This test claim was processed through [facility]- copay amounts may vary at other pharmacies due to pharmacy/plan contracts, or as the patient moves through the different stages of their insurance plan.   PA #/Case ID/Reference #: [id number]

## 2024-07-26 ENCOUNTER — Encounter: Payer: Self-pay | Admitting: Physical Therapy

## 2024-07-30 ENCOUNTER — Ambulatory Visit: Admitting: Family Medicine

## 2024-07-30 ENCOUNTER — Encounter: Payer: Self-pay | Admitting: Family Medicine

## 2024-07-30 VITALS — BP 134/73 | HR 83 | Ht 61.0 in | Wt 243.0 lb

## 2024-07-30 DIAGNOSIS — Z1231 Encounter for screening mammogram for malignant neoplasm of breast: Secondary | ICD-10-CM

## 2024-07-30 DIAGNOSIS — Z6841 Body Mass Index (BMI) 40.0 and over, adult: Secondary | ICD-10-CM

## 2024-07-30 DIAGNOSIS — I10 Essential (primary) hypertension: Secondary | ICD-10-CM | POA: Diagnosis not present

## 2024-07-30 DIAGNOSIS — R7309 Other abnormal glucose: Secondary | ICD-10-CM

## 2024-07-30 DIAGNOSIS — R7989 Other specified abnormal findings of blood chemistry: Secondary | ICD-10-CM

## 2024-07-30 DIAGNOSIS — E66813 Obesity, class 3: Secondary | ICD-10-CM

## 2024-07-30 DIAGNOSIS — R7301 Impaired fasting glucose: Secondary | ICD-10-CM

## 2024-07-30 DIAGNOSIS — R079 Chest pain, unspecified: Secondary | ICD-10-CM

## 2024-07-30 NOTE — Progress Notes (Signed)
 Established Patient Office Visit  Patient ID: Connie Carlson, female    DOB: [birth year]  Age: 43 y.o. MRN: [id number] PCP: Alvan Dorothyann BIRCH, MD  Chief Complaint  Patient presents with   Medical Management of Chronic Issues    Subjective:     HPI  Discussed the use of AI scribe software for clinical note transcription with the patient, who gave verbal consent to proceed.  History of Present Illness Connie Carlson is a 43 year old female who presents with chest pain and shortness of breath.  Chest pain - Intermittent sensation in the left breast area described as an 'electrical shot' - Duration of symptoms for approximately one month - Episodes occur two to three times daily - Pain is not constant and can occur at rest, including while sitting and watching TV - No associated tenderness upon palpation  Dyspnea - Shortness of breath present for the past three to four months - Described as inability to take in enough air or as if a 'bulb inside' opens and closes, sometimes remaining closed when she needs it to open - Episodes vary in frequency and can occur both at rest and with activity - No history of childhood asthma  Medication use and concerns - Current caffeine intake limited to one cup of coffee per day and occasional Coke, reduced from three cups daily about a year ago - Recently prescribed Valium by a pelvic floor specialist for pelvic muscle relaxation - Expresses concern regarding the safety and potential interactions of Valium with her current depression treatment   Saw her GYN in September.    {History (Optional):23778}  ROS    Objective:     BP 134/73   Pulse 83   Ht 5' 1 (1.549 m)   Wt 243 lb (110.2 kg)   SpO2 96%   BMI 45.91 kg/m  {Vitals History (Optional):23777}  Physical Exam Vitals and nursing note reviewed.  Constitutional:      Appearance: Normal appearance.  HENT:     Head: Normocephalic and atraumatic.  Eyes:      Conjunctiva/sclera: Conjunctivae normal.  Cardiovascular:     Rate and Rhythm: Normal rate and regular rhythm.  Pulmonary:     Effort: Pulmonary effort is normal.     Breath sounds: Normal breath sounds.  Skin:    General: Skin is warm and dry.  Neurological:     Mental Status: She is alert.  Psychiatric:        Mood and Affect: Mood normal.     {PhysExam Abridge (Optional):210964309} No results found for any visits on 07/30/24.  {Labs (Optional):23779}  The 10-year ASCVD risk score (Arnett DK, et al., 2019) is: 2.5%    Assessment & Plan:   Problem List Items Addressed This Visit       Cardiovascular and Mediastinum   Hypertension - Primary   BP is OK, continue current regimen. Will get updated labs today.        Relevant Orders   CMP14+EGFR   Lipid Panel With LDL/HDL Ratio   HgB A1c   CBC with Differential/Platelet   VITAMIN D  25 Hydroxy (Vit-D Deficiency, Fractures)     Other   Elevated glucose   Relevant Orders   CMP14+EGFR   Lipid Panel With LDL/HDL Ratio   HgB A1c   CBC with Differential/Platelet   VITAMIN D  25 Hydroxy (Vit-D Deficiency, Fractures)   Class 3 severe obesity due to excess calories with body mass index (BMI) of 45.0 to 49.9  in adult [facility])   Continue to work on healthy diet and exercise.        Other Visit Diagnoses       Left-sided chest pain       Relevant Orders   DG Chest 2 View   CBC with Differential/Platelet   VITAMIN D  25 Hydroxy (Vit-D Deficiency, Fractures)   EKG 12-Lead     Low vitamin D  level       Relevant Orders   VITAMIN D  25 Hydroxy (Vit-D Deficiency, Fractures)     Screening mammogram for breast cancer       Relevant Orders   MM 3D SCREENING MAMMOGRAM BILATERAL BREAST       Assessment and Plan Assessment & Plan Chest pain, left breast area and shortness of breath Intermittent chest pain and shortness of breath with low likelihood of cardiac origin. Differential includes cardiac, pulmonary, musculoskeletal,  and breast tissue causes. Normal EKG.  - Mammogram overdue. order placed.  - Ordered chest X-ray to evaluate shortness of breath. - Consider updating mammogram if overdue. - In looking back at records has had SOB on and off over last several years.  - If CXR normal will schedule for spirometry.    General Health Maintenance Discussed caffeine intake and its effects on breast tissue. She has reduced caffeine consumption.  EKG shows rate of   No follow-ups on file.    Dorothyann Byars, MD [facility] at [facility]

## 2024-07-31 ENCOUNTER — Ambulatory Visit: Payer: Self-pay | Admitting: Family Medicine

## 2024-07-31 ENCOUNTER — Encounter: Payer: Self-pay | Attending: Obstetrics and Gynecology | Admitting: Physical Therapy

## 2024-07-31 ENCOUNTER — Encounter: Payer: Self-pay | Admitting: Physical Therapy

## 2024-07-31 DIAGNOSIS — M6281 Muscle weakness (generalized): Secondary | ICD-10-CM | POA: Insufficient documentation

## 2024-07-31 DIAGNOSIS — R252 Cramp and spasm: Secondary | ICD-10-CM | POA: Insufficient documentation

## 2024-07-31 DIAGNOSIS — R102 Pelvic and perineal pain unspecified side: Secondary | ICD-10-CM | POA: Insufficient documentation

## 2024-07-31 DIAGNOSIS — R1030 Lower abdominal pain, unspecified: Secondary | ICD-10-CM | POA: Insufficient documentation

## 2024-07-31 LAB — LIPID PANEL WITH LDL/HDL RATIO
Cholesterol, Total: 156 mg/dL (ref 100–199)
HDL: 52 mg/dL (ref >39)
LDL Chol Calc (NIH): 86 mg/dL (ref 0–99)
LDL/HDL Ratio: 1.7 ratio (ref 0.0–3.2)
Triglycerides: 95 mg/dL (ref 0–149)
VLDL Cholesterol Cal: 18 mg/dL (ref 5–40)

## 2024-07-31 LAB — VITAMIN D 25 HYDROXY (VIT D DEFICIENCY, FRACTURES): Vit D, 25-Hydroxy: 26.1 ng/mL — ABNORMAL LOW (ref 30.0–100.0)

## 2024-07-31 LAB — CMP14+EGFR
ALT: 21 IU/L (ref 0–32)
AST: 22 IU/L (ref 0–40)
Albumin: 4.1 g/dL (ref 3.9–4.9)
Alkaline Phosphatase: 64 IU/L (ref 41–116)
BUN/Creatinine Ratio: 15 (ref 9–23)
BUN: 10 mg/dL (ref 6–24)
Bilirubin Total: <0.2 mg/dL (ref 0.0–1.2)
CO2: 21 mmol/L (ref 20–29)
Calcium: 9.3 mg/dL (ref 8.7–10.2)
Chloride: 102 mmol/L (ref 96–106)
Creatinine, Ser: 0.68 mg/dL (ref 0.57–1.00)
Globulin, Total: 2.9 g/dL (ref 1.5–4.5)
Glucose: 83 mg/dL (ref 70–99)
Potassium: 4 mmol/L (ref 3.5–5.2)
Sodium: 137 mmol/L (ref 134–144)
Total Protein: 7 g/dL (ref 6.0–8.5)
eGFR: 110 mL/min/1.73 (ref >59)

## 2024-07-31 LAB — CBC WITH DIFFERENTIAL/PLATELET
Basophils Absolute: 0 x10E3/uL (ref 0.0–0.2)
Basos: 0 %
EOS (ABSOLUTE): 0.1 x10E3/uL (ref 0.0–0.4)
Eos: 1 %
Hematocrit: 42.4 % (ref 34.0–46.6)
Hemoglobin: 13.7 g/dL (ref 11.1–15.9)
Immature Grans (Abs): 0 x10E3/uL (ref 0.0–0.1)
Immature Granulocytes: 0 %
Lymphocytes Absolute: 2.2 x10E3/uL (ref 0.7–3.1)
Lymphs: 22 %
MCH: 31 pg (ref 26.6–33.0)
MCHC: 32.3 g/dL (ref 31.5–35.7)
MCV: 96 fL (ref 79–97)
Monocytes Absolute: 0.5 x10E3/uL (ref 0.1–0.9)
Monocytes: 5 %
Neutrophils Absolute: 7.1 x10E3/uL — ABNORMAL HIGH (ref 1.4–7.0)
Neutrophils: 72 %
Platelets: 344 x10E3/uL (ref 150–450)
RBC: 4.42 x10E6/uL (ref 3.77–5.28)
RDW: 12.9 % (ref 11.7–15.4)
WBC: 9.9 x10E3/uL (ref 3.4–10.8)

## 2024-07-31 LAB — HEMOGLOBIN A1C
Est. average glucose Bld gHb Est-mCnc: 111 mg/dL
Hgb A1c MFr Bld: 5.5 % (ref 4.8–5.6)

## 2024-07-31 NOTE — Assessment & Plan Note (Signed)
Continue to work on healthy diet and exercise.   

## 2024-07-31 NOTE — Therapy (Signed)
 OUTPATIENT PHYSICAL THERAPY FEMALE PELVIC TREATMENT   Patient Name: Connie Carlson MRN: [id number] DOB:December 23, 1980, 43 y.o., female Today's Date: 07/31/2024  END OF SESSION:  PT End of Session - 07/31/24 1310     Visit Number 18    Date for Recertification  09/03/24    Authorization Type [employer]    Authorization - Visit Number 18    Authorization - Number of Visits 90    PT Start Time 1308    PT Stop Time 1350    PT Time Calculation (min) 42 min    Activity Tolerance Patient tolerated treatment well    Behavior During Therapy WFL for tasks assessed/performed          Past Medical History:  Diagnosis Date   Depression    High blood pressure    Migraine    Past Surgical History:  Procedure Laterality Date   CHOLECYSTECTOMY     mole remove     under right breast by [facility] - skin cancer   Patient Active Problem List   Diagnosis Date Noted   Levator spasm 07/24/2024   SUI (stress urinary incontinence, female) 07/24/2024   Urinary frequency 07/24/2024   Other insomnia 03/30/2023   Generalized obesity 03/30/2023   BMI 37.0-37.9, adult 03/30/2023   Elevated glucose 03/30/2023   Vitamin D  deficiency 03/30/2023   Health care maintenance 03/30/2023   SOB (shortness of breath) on exertion 03/30/2023   Other fatigue 03/30/2023   Major depressive disorder, recurrent episode, mild 08/26/2022   Stress and adjustment reaction 06/29/2022   Anal fissure 01/13/2022   Depression, recurrent 04/07/2021   Hypertension 06/11/2020   Class 3 severe obesity due to excess calories with body mass index (BMI) of 45.0 to 49.9 in adult (HCC) 06/11/2020   IFG (impaired fasting glucose) 10/08/2013   Migraine headache without aura 02/16/2013   WEIGHT GAIN 01/01/2009   Allergic rhinitis 06/28/2006   GASTROESOPHAGEAL REFLUX, NO ESOPHAGITIS 06/28/2006    PCP: Alvan Dorothyann BIRCH, MD  REFERRING PROVIDER: Erik Kieth JAYSON, MD   REFERRING DIAG: M62.89 (ICD-10-CM) - Pelvic  floor dysfunction   THERAPY DIAG:  Cramp and spasm  Pelvic pain  Muscle weakness (generalized)  Lower abdominal pain  Rationale for Evaluation and Treatment: Rehabilitation  ONSET DATE: 1/23  SUBJECTIVE:                                                                                                                                                                                           SUBJECTIVE STATEMENT:  I had issues with my insurance making it hard to come to therapy for the month of August. I  saw Dr. Marilynne last week and I am to use muscle relaxer's for the next 4 weeks. I started the medication yesterday. I am not back at work yet. I will be off for another 8 weeks. Pain is not as frequently. I try to do the exercises at home. I have had some back pain. I am walking 4 times per week for 35 minutes. I am using the wand 2 times  per week. I stopped drinking fluid by 8 PM.   PAIN:  Are you having pain? Yes NPRS scale: 7/10; 11/22/23 12/13/23 5/10 12/20/23: pain level is 5/10 01/12/24: 7/10 after cleaning in house and after doing the wand 02/02/24: pain 5/10 03/06/24: pain level is 5/10 03/20/24:  pain level recently with activity is 10/10 and average pain is 5/10 04/06/24: pain level 8/10 07/31/24: pain level 6/10.   Pain location: lower abdominal area  Pain type: pressure, like something is going to come out of the vagina Pain description: intermittent   Aggravating factors: lifting heavy items, standing several hours, carrying and bending over Relieving factors: Advil  PRECAUTIONS: None  RED FLAGS: None   WEIGHT BEARING RESTRICTIONS: No  FALLS:  Has patient fallen in last 6 months? No  LIVING ENVIRONMENT: Lives with: lives with their family  OCCUPATION: works at [employer] with a lot of lifting.   PLOF: Independent  PATIENT GOALS: reduce pain  PERTINENT HISTORY:  Cholecystectomy Sexual abuse: No  BOWEL MOVEMENT: no issues  URINATION: Pain with urination:  No Fully empty bladder: Yes:   Stream: Strong Urgency: No Frequency: average Leakage: Coughing, sneeze, laughing 12/13/23: leakage 30 % better.  12/20/23: urinary leakage is 40% better 01/12/24: leak with sneeze, no leakage with laughing 03/06/24: leak urine only when she sneezes 07/31/24: leaks urine with laugh loud, cough, sneeze. Night time wakes up 2 times per night.  Pads: No  INTERCOURSE: Pain with intercourse: After Intercourse, 10/10 Ability to have vaginal penetration:  Yes:   Climax: not when having pain Marinoff Scale: 2/3  PREGNANCY: Vaginal deliveries 1 Tearing No  OBJECTIVE:  Note: Objective measures were completed at Evaluation unless otherwise noted.  DIAGNOSTIC FINDINGS:  Ultrasound negative; biopsy is negative   COGNITION: Overall cognitive status: Within functional limits for tasks assessed     SENSATION: Light touch: Appears intact Proprioception: Appears intact   POSTURE: No Significant postural limitations  PELVIC ALIGNMENT:  LUMBARAROM/PROM: full lumbar ROM 07/31/24: thoracic rotation right had pain the right thoracic area.    LOWER EXTREMITY ROM: full bilateral hip ROM   LOWER EXTREMITY MMT: Bilateral hip strength is 4/5.    PALPATION:   General  tenderness located in the lower abdomen, decreased movement of the lower rib cage, good mobility of the diaphragm, tenderness in the buttocks                External Perineal Exam tenderness located in the bulbocavernosus, ischiocavernosus, perineal body                             Internal Pelvic Floor levator ani, obturator internist, side of the introitus, sides of the bladder  Patient confirms identification and approves PT to assess internal pelvic floor and treatment Yes  PELVIC MMT:   MMT eval 10/11/23 01/12/24 03/20/24  Vaginal 3/5 holding for 6 seconds 3/5 3/5 with difficulty recruiting 4/5 holding 3 seconds  (Blank rows = not tested)         TONE: increased  PROLAPSE:  None 12/13/23: when patient bears down in supine anterior wall weakness to the introitus   TODAY'S TREATMENT:  07/31/24 Neuromuscular re-education: Core retraining: Bridge 15 x Transverse abdominus contraction supine 20 x Supine rotation pulling arms on green band.  Quadruped lift opposite arm and leg but had pain in the lower abdominal area Supine heel slides with abdominal contraction 10 x each leg  Exercises: Stretches/mobility: Thread the needle holding 15 sec 2 times each side Tried childs pose but increased pain in the back and lower abdomen One knee on yoga block moving side to side, move forward and back then cat cow each side Self-care: Educated patient on how to lay on a ball or in sitting to massage areas of pain. Patient had return demonstration.     04/06/24 Manual: Internal pelvic floor techniques: No emotional/communication barriers or cognitive limitation. Patient is motivated to learn. Patient understands and agrees with treatment goals and plan. PT explains patient will be examined in standing, sitting, and lying down to see how their muscles and joints work. When they are ready, they will be asked to remove their underwear so PT can examine their perineum. The patient is also given the option of providing their own chaperone as one is not provided in our facility. The patient also has the right and is explained the right to defer or refuse any part of the evaluation or treatment including the internal exam. With the patient's consent, PT will use one gloved finger to gently assess the muscles of the pelvic floor, seeing how well it contracts and relaxes and if there is muscle symmetry. After, the patient will get dressed and PT and patient will discuss exam findings and plan of care. PT and patient discuss plan of care, schedule, attendance policy and HEP activities.  Therapist gloved finger in the vaginal canal working on the levator ani,  obturator internist, along the ischiocavernosus, bulbocavernosus, and perineal body to elongate the muscle and reduce trigger points Neuromuscular re-education: Down training: Therapist gloved finger in the vaginal canal feeling the pelvic floor relax with several tries. She continues to need verbal cues on how to relax the pelvic floor.     03/20/24 Manual: Myofascial release: Fascial release of the urogenital diaphragm going through the different levels Internal pelvic floor techniques: No emotional/communication barriers or cognitive limitation. Patient is motivated to learn. Patient understands and agrees with treatment goals and plan. PT explains patient will be examined in standing, sitting, and lying down to see how their muscles and joints work. When they are ready, they will be asked to remove their underwear so PT can examine their perineum. The patient is also given the option of providing their own chaperone as one is not provided in our facility. The patient also has the right and is explained the right to defer or refuse any part of the evaluation or treatment including the internal exam. With the patient's consent, PT will use one gloved finger to gently assess the muscles of the pelvic floor, seeing how well it contracts and relaxes and if there is muscle symmetry. After, the patient will get dressed and PT and patient will discuss exam findings and plan of care. PT and patient discuss plan of care, schedule, attendance policy and HEP activities.  Therapist gloved finger performing manual work to the right levator ani, ischiocavernosus, pubovaginalis, urethra sphincter and obturator internist to reduce trigger points     PATIENT EDUCATION:  01/12/24 Education details: Access Code: [email or web address], educated patient on dry  needling.  Person educated: Patient Education method: Explanation, Demonstration, Tactile cues, Verbal cues, and Handouts Education comprehension: verbalized understanding,  returned demonstration, verbal cues required, tactile cues required, and needs further education  HOME EXERCISE PROGRAM: 12/13/23 Access Code: [email or web address] URL: [email or web address] Date: 12/20/2023 Prepared by: Channing Pereyra  Exercises - Seated Piriformis Stretch with Trunk Bend  - 1 x daily - 7 x weekly - 1 sets - 1 reps - 30 sec hold - Seated Hamstring Stretch  - 1 x daily - 7 x weekly - 1 sets - 1 reps - 30 sec hold - Seated Happy Baby With Trunk Flexion For Pelvic Relaxation  - 1 x daily - 7 x weekly - 1 sets - 1 reps - 30 sec hold - Cat Cow  - 1 x daily - 7 x weekly - 1 sets - 10 reps - Diaphragmatic Breathing in Child's Pose with Pelvic Floor Relaxation  - 1 x daily - 7 x weekly - 1 sets - 5 reps - 10 sec hold - Supine Diaphragmatic Breathing  - 1 x daily - 7 x weekly - 1 sets - 10 reps - Sidelying Thoracic Rotation with Open Book  - 1 x daily - 2 x weekly - 1 sets - 10 reps - Diaphragmatic Breathing with Hips Elevated (for Pelvic Organ Prolapse)  - 1 x daily - 7 x weekly - 3 sets - 10 reps - Bent Knee Fallouts  - 1 x daily - 3 x weekly - 2 sets - 10 reps - Supine Transversus Abdominis Bracing with Heel Slide  - 1 x daily - 3 x weekly - 2 sets - 10 reps - Diaphragmatic Breathing with Hips Elevated (for Pelvic Organ Prolapse)  - 2 x daily - 7 x weekly - 1 sets - 10 reps - Supine Bridge with Pelvic Floor Contraction  - 1 x daily - 3 x weekly - 1 sets - 10 reps - Quadruped Leg Lifts  - 1 x daily - 3 x weekly - 2 sets - 10 reps - Dead Bug  - 1 x daily - 3 x weekly - 2 sets - 10 reps - Sidelying Hip Adduction  - 1 x daily - 3 x weekly - 2 sets - 10 reps - Sidelying Pelvic Floor Contraction with Hip Abduction  - 1 x daily - 3 x weekly - 2 sets - 10 reps    ASSESSMENT:  CLINICAL IMPRESSION: Patient is a 43 y.o. female who was seen today for physical therapy  treatment for pelvic floor dysfunction. Patient has return to physical therapy and is ready to begin working on her  pain. She is now using Valium in the vaginal area nightly. She has a new updated HEP to reduce the strain on her lower abdomen. She has not returned to work yet. She has weakness in her core and hips. She continues to have pelvic floor and lower abdominal pain. She will leak urine with coughing, sneezing and laughing. She wakes up 2 time per night to urinate. She continues to use her vaginal wand and on a walking program. Patient will benefit from skilled therapy to reduce her pain to restore her function.    OBJECTIVE IMPAIRMENTS: decreased activity tolerance, decreased strength, increased fascial restrictions, increased muscle spasms, and pain.   ACTIVITY LIMITATIONS: lifting and standing  PARTICIPATION LIMITATIONS: cleaning, laundry, shopping, community activity, and occupation  PERSONAL FACTORS: Fitness are also affecting patient's functional outcome.   REHAB POTENTIAL: Excellent  CLINICAL DECISION MAKING: Stable/uncomplicated  EVALUATION COMPLEXITY: Low   GOALS: Goals reviewed with patient? Yes  SHORT TERM GOALS: Target date: 09/26/23  Patient independent with initial HEP for flexibility and diaphragmatic  breathing.  Baseline: Goal status: Met 10/04/23  2.  Patient educated on correct lifting technique to reduce strain on the pelvic floor.  Baseline:  Goal status: met 11/15/23  3.  Patient educated on perineal massage to reduce trigger points.  Baseline: educated patient on vaginal wand Goal status: Met 11/15/23   LONG TERM GOALS: Target date: 09/03/24  Patient independent with advanced HEP for core and pelvic floor strength.  Baseline: continues to learn as she progresses Goal status: ongoing 03/06/24  2.  Patient able to lift 20# with minimal to no pain due to reduction of trigger points in the pelvic floor.  Baseline: lifting limit 20 # Goal status: ongoing 03/06/24  3.  Patient reports minimal to no pain after penile penetration due to the ability to lengthen the  pelvic floor.  Baseline: pain level 6/10 Goal status: defer due to not appropriate at this time   4.  Patient reports her lower abdominal pain decreased </= 0-1/10 with standing 1-2 hours at work due to improve strength of the core and hips.  Baseline: pain level 6/10 Goal status: ongoing 07/31/24   PLAN:  PT FREQUENCY: 1x/week  PT DURATION: 6 months  PLANNED INTERVENTIONS: 97110-Therapeutic exercises, 97530- Therapeutic activity, [id number]- Neuromuscular re-education, 97140- Manual therapy, 97014- Electrical stimulation (unattended), 97035- Ultrasound, Patient/Family education, Taping, Dry Needling, Cryotherapy, Moist heat, and Biofeedback  PLAN FOR NEXT SESSION:  internal  pelvic floor work,  lifting, core strength,  write renewal and update goals.   Channing Pereyra, PT 07/31/24 2:09 PM

## 2024-07-31 NOTE — Progress Notes (Signed)
 HILuz, your vitamin D  is still low, please start 25mcg daily.  Other labs look good

## 2024-07-31 NOTE — Assessment & Plan Note (Signed)
 BP is OK, continue current regimen. Will get updated labs today.

## 2024-08-01 NOTE — Assessment & Plan Note (Signed)
 Due for updated A1C.

## 2024-08-03 ENCOUNTER — Encounter: Payer: Self-pay | Admitting: Professional

## 2024-08-03 ENCOUNTER — Ambulatory Visit (INDEPENDENT_AMBULATORY_CARE_PROVIDER_SITE_OTHER): Payer: Self-pay | Admitting: Professional

## 2024-08-03 DIAGNOSIS — F4322 Adjustment disorder with anxiety: Secondary | ICD-10-CM

## 2024-08-03 DIAGNOSIS — F331 Major depressive disorder, recurrent, moderate: Secondary | ICD-10-CM

## 2024-08-03 NOTE — Progress Notes (Signed)
 [facility] Counselor/Therapist Progress Note  Patient ID: Connie Carlson, MRN: [id number],    Prefers to go by [name], her middle name  Date: 08/03/2024  Time Spent:  54 minutes [id number]   Treatment Type: Individual Therapy  Risk Assessment: Danger to Self:  No Self-injurious Behavior: No Danger to Others: No  Subjective: The patient arrived late for her inperson appointment.   Issues addressed: 1-has not explored opportunities for career of choice 2-self-care -still feels guilty for taking down time -she has started trying to take better care of herself -improved mood, feels less depressed 3-professional -pt medical leave continues -pt talked about possible foster care but has not signed up for classes -suggested pt could consider specifically what children she wants to work with -pt feels conflicted, she wants to help children but is unsure why she is hesitant -admits feeling embarrassed about her weight gain and going back to work  Treatment Plan Problems Addressed  Career Success Obstacles, Depression, Low Self-Esteem/Lack of Assertiveness, Partner Relational Problems  Goals 1. Develop a sense of own personal power and self-esteem within the relationship. 2. Develop advocacy and empowerment skills that involve taking a proactive stance in the work environment. 3. Develop assertiveness skills and techniques for application in work-related activities (e.g., establishing boundaries with coworkers, garment/textile technologist). 4. Develop healthy cognitive mechanisms to facilitate positive attitudes and beliefs about self within the context of one's environment to mitigate depressive symptoms. Objective Articulate signs and symptoms of depression in current life experiences. Target Date: 2024-11-03 Frequency: Biweekly  Progress: 70 Modality: individual  Related Interventions Encourage the client to share her feelings of depression to gain an insight into  precipitating events and implications of symptoms; normalize her feelings of depression. Objective Implement behavioral interventions to overcome depression. Target Date: 2024-11-03 Frequency: Biweekly  Progress: 30 Modality: individual  Related Interventions Assign the client to participate as fully as possible in a healthy exercise regimen. Encourage the client to continue participation in positive social support systems. Objective Identify and replace cognitive self-talk that supports depression. Target Date: 2024-11-03 Frequency: Biweekly  Progress: 30 Modality: individual  Related Interventions Reinforce the client's positive, reality-based cognitive messages that enhance self-confidence and increase adaptive action (see Positive Self-Talk in the Adult Psychotherapy Homework Planner, 2nd ed. by Jenniffer). Do behavioral experiments in which depressive automatic thoughts are treated as hypotheses/predictions, reality-based alternative hypotheses/ predictions are generated, and both are tested against the client's past, present, and/or future experiences. Encourage the client to discuss cognitive distortions, including automatic thoughts (e.g., negative view of self, future, experience) and negative schemas (e.g., core beliefs about self and others based on earlier childhood experiences); assess frequency of negative self-statements associated with depression. Objective Increase the frequency of engaging in pleasant activities. Target Date: 2024-11-03 Frequency: Biweekly  Progress: 30 Modality: individual  Related Interventions Assign the client reading materials regarding overcoming depression for women (e.g., Women and Depression: A Practical Self-Help Guide by Jarold; Feeling Good by Geofm; Women & Depression by Gweneth; Silencing the Self: Women and Depression by Marinell). Objective Identify precipitating events and factors of depression, particularly roles of self and others in  depression. Target Date: 2024-11-03 Frequency: Biweekly   Modality: individual  Related Interventions Explore the degree to which primary symptoms of depression are a result of gender role socialization (e.g., passivity, decreased self-esteem, learned helplessness, interpersonal orientation towards pleasing others). Evaluate symptoms to consider the presence of culture-bound syndromes and/or culture specificity of symptoms (e.g., ataques de nervios, amok, postemigration effects). Objective Increase the  level of physical exercise. Target Date: 2024-11-03 Frequency: Biweekly  Progress: 10 Modality: individual  Objective Increase social contacts and communicate needs within existing interpersonal relationships. Target Date: 2024-11-03 Frequency: Biweekly  Progress: 30 Modality: individual  5. Explore career options that have been automatically ruled out due to low self-concept or limited opportunities. Objective Identify and replace at least three negative self-talk statements that perpetuate feelings of powerlessness or hopelessness. Target Date: 2024-11-03 Frequency: Biweekly  Progress: 10 Modality: individual  Objective Identify and replace cognitive self-talk that contributes to all-or-nothing thinking, gender-role stereotypes, or low career self-efficacy. Target Date: 2024-11-03 Frequency: Biweekly  Progress: 10 Modality: individual  Objective Develop career-related strategies to overcome and persist in the face of obstacles. Target Date: 2024-11-03 Frequency: Biweekly  Progress: 30 Modality: individual  Objective Increase self-esteem and self-concept by exploring and encompassing other roles/identities besides those of career. Target Date: 2024-11-03 Frequency: Biweekly  Progress: 20 Modality: individual  Objective Identify barriers and personal benefits to balancing career and family life. Target Date: 2024-11-03 Frequency: Biweekly  Progress: 60 Modality: individual  6.  Identify and increase intrapersonal, interpersonal, and physical resources to foster positive coping strategies. 7. Improve depressed mood to maximize effective social, occupational, and physical functioning. 8. Improve self-esteem and develop a positive self-image as capable and competent. 9. Increase ability to express needs and desires openly and honestly. 10. Increase assertiveness skills and ability to advocate for self. 11. Increase awareness of own role in relationship conflicts. Objective Identify a pattern of repeatedly forming destructive intimate relationships. Target Date: 2024-11-03 Frequency: Biweekly  Progress: 60 Modality: individual  12. Increase involvement in activities that foster confidence and a sense of accomplishment. Objective Acknowledge self-disparaging statements and recognize the tendency to engage in such statements. Target Date: 2024-11-03 Frequency: Biweekly  Progress: 60 Modality: individual  Related Interventions Ask the client to complete and process an exercise in the book Ten Days to Self-Esteem! [employer]). Objective Verbalize an understanding of the differences between passive, assertive, and aggressive behavior. Target Date: 2024-11-03 Frequency: Biweekly  Progress: 50 Modality: individual  Objective Describe personal history of self-devaluation and lack of assertiveness. Target Date: 2024-11-03 Frequency: Biweekly  Progress: 60 Modality: individual  Related Interventions Use cognitive-restructuring techniques to change the client's belief system (i.e., via reality-testing and rational thought) toward a more positive, realistic self-perception. Assist the client in becoming aware of how she indirectly expresses negative feelings about self (e.g., lack of eye contact, social withdrawal, sweating, expectations of failure or rejection). Assist the client in identifying negative beliefs about herself. Discuss context of self-disparagement, including  client's perceptions of self from childhood to present, role of caregivers and peers, and related experiences. Objective Identify three roadblocks to improved self-esteem. Target Date: 2024-11-03 Frequency: Biweekly  Progress: 0 Modality: individual  Objective   Diagnosis:Major depressive disorder, recurrent episode, moderate (HCC)  Adjustment disorder with anxiety  Plan: -meet again on Friday, August 03, 2024 at 8am in person

## 2024-08-09 ENCOUNTER — Encounter: Payer: Self-pay | Admitting: Physical Therapy

## 2024-08-09 DIAGNOSIS — M6281 Muscle weakness (generalized): Secondary | ICD-10-CM

## 2024-08-09 DIAGNOSIS — R252 Cramp and spasm: Secondary | ICD-10-CM

## 2024-08-09 DIAGNOSIS — R1030 Lower abdominal pain, unspecified: Secondary | ICD-10-CM

## 2024-08-09 DIAGNOSIS — R102 Pelvic and perineal pain unspecified side: Secondary | ICD-10-CM

## 2024-08-09 NOTE — Therapy (Signed)
 OUTPATIENT PHYSICAL THERAPY FEMALE PELVIC TREATMENT   Patient Name: Connie Carlson MRN: [id number] DOB:[birth year], 43 y.o., female Today's Date: 08/09/2024  END OF SESSION:  PT End of Session - 08/09/24 1139     Visit Number 19    Date for Recertification  09/03/24    Authorization Type [employer]    Authorization - Visit Number 19    Authorization - Number of Visits 90    PT Start Time 1130    PT Stop Time 1215    PT Time Calculation (min) 45 min    Activity Tolerance Patient tolerated treatment well    Behavior During Therapy [facility] for tasks assessed/performed          Past Medical History:  Diagnosis Date   Depression    High blood pressure    Migraine    Past Surgical History:  Procedure Laterality Date   CHOLECYSTECTOMY     mole remove     under right breast by [facility] - skin cancer   Patient Active Problem List   Diagnosis Date Noted   Levator spasm 07/24/2024   SUI (stress urinary incontinence, female) 07/24/2024   Urinary frequency 07/24/2024   Other insomnia 03/30/2023   Generalized obesity 03/30/2023   BMI 37.0-37.9, adult 03/30/2023   Elevated glucose 03/30/2023   Vitamin D  deficiency 03/30/2023   Health care maintenance 03/30/2023   SOB (shortness of breath) on exertion 03/30/2023   Other fatigue 03/30/2023   Major depressive disorder, recurrent episode, mild 08/26/2022   Stress and adjustment reaction 06/29/2022   Anal fissure 01/13/2022   Depression, recurrent 04/07/2021   Hypertension 06/11/2020   Class 3 severe obesity due to excess calories with body mass index (BMI) of 45.0 to 49.9 in adult (HCC) 06/11/2020   IFG (impaired fasting glucose) 10/08/2013   Migraine headache without aura 02/16/2013   WEIGHT GAIN 01/01/2009   Allergic rhinitis 06/28/2006   GASTROESOPHAGEAL REFLUX, NO ESOPHAGITIS 06/28/2006    PCP: Alvan Dorothyann BIRCH, MD  REFERRING PROVIDER: Erik Kieth JAYSON, MD   REFERRING DIAG: M62.89 (ICD-10-CM) - Pelvic  floor dysfunction   THERAPY DIAG:  Cramp and spasm  Pelvic pain  Muscle weakness (generalized)  Lower abdominal pain  Rationale for Evaluation and Treatment: Rehabilitation  ONSET DATE: 1/23  SUBJECTIVE:                                                                                                                                                                                           SUBJECTIVE STATEMENT:  I have been bleeding for 5 days. I am sore in the back when I have a bowel movement.  PAIN:  Are you having pain? Yes NPRS scale: 7/10; 11/22/23 12/13/23 5/10 12/20/23: pain level is 5/10 01/12/24: 7/10 after cleaning in house and after doing the wand 02/02/24: pain 5/10 03/06/24: pain level is 5/10 03/20/24:  pain level recently with activity is 10/10 and average pain is 5/10 04/06/24: pain level 8/10 07/31/24: pain level 6/10.  08/09/24: pain level 7/10  Pain location: lower abdominal area  Pain type: pressure, like something is going to come out of the vagina Pain description: intermittent   Aggravating factors: lifting heavy items, standing several hours, carrying and bending over Relieving factors: Advil  PRECAUTIONS: None  RED FLAGS: None   WEIGHT BEARING RESTRICTIONS: No  FALLS:  Has patient fallen in last 6 months? No  LIVING ENVIRONMENT: Lives with: lives with their family  OCCUPATION: works at [employer] with a lot of lifting.   PLOF: Independent  PATIENT GOALS: reduce pain  PERTINENT HISTORY:  Cholecystectomy Sexual abuse: No  BOWEL MOVEMENT: no issues  URINATION: Pain with urination: No Fully empty bladder: Yes:   Stream: Strong Urgency: No Frequency: average Leakage: Coughing, sneeze, laughing 12/13/23: leakage 30 % better.  12/20/23: urinary leakage is 40% better 01/12/24: leak with sneeze, no leakage with laughing 03/06/24: leak urine only when she sneezes 07/31/24: leaks urine with laugh loud, cough, sneeze. Night time wakes up 2 times  per night.  Pads: No  INTERCOURSE: Pain with intercourse: After Intercourse, 10/10 Ability to have vaginal penetration:  Yes:   Climax: not when having pain Marinoff Scale: 2/3  PREGNANCY: Vaginal deliveries 1 Tearing No  OBJECTIVE:  Note: Objective measures were completed at Evaluation unless otherwise noted.  DIAGNOSTIC FINDINGS:  Ultrasound negative; biopsy is negative   COGNITION: Overall cognitive status: Within functional limits for tasks assessed     SENSATION: Light touch: Appears intact Proprioception: Appears intact   POSTURE: No Significant postural limitations  PELVIC ALIGNMENT:  LUMBARAROM/PROM: full lumbar ROM 07/31/24: thoracic rotation right had pain the right thoracic area.    LOWER EXTREMITY ROM: full bilateral hip ROM   LOWER EXTREMITY MMT: Bilateral hip strength is 4/5.    PALPATION:   General  tenderness located in the lower abdomen, decreased movement of the lower rib cage, good mobility of the diaphragm, tenderness in the buttocks                External Perineal Exam tenderness located in the bulbocavernosus, ischiocavernosus, perineal body                             Internal Pelvic Floor levator ani, obturator internist, side of the introitus, sides of the bladder  Patient confirms identification and approves PT to assess internal pelvic floor and treatment Yes  PELVIC MMT:   MMT eval 10/11/23 01/12/24 03/20/24  Vaginal 3/5 holding for 6 seconds 3/5 3/5 with difficulty recruiting 4/5 holding 3 seconds  (Blank rows = not tested)        TONE: increased  PROLAPSE: None 12/13/23: when patient bears down in supine anterior wall weakness to the introitus   TODAY'S TREATMENT:  08/09/24 Manual: Soft tissue mobilization: Manual work to the hip adductors and quadriceps.  Neuromuscular re-education: Core retraining: Supine transverse abdominus 10 x  Bridge with thighs on leg rest 15 x  Supine press palms into thighs to engage the  lower abdomen 15 x each Supine pull the band to the other side to work on the obl Down training: Sitting  on ball and massage the pelvic floor muscles to reduce pain.  Foam roll to the hip adductors     07/31/24 Neuromuscular re-education: Core retraining: Bridge 15 x Transverse abdominus contraction supine 20 x Supine rotation pulling arms on green band.  Quadruped lift opposite arm and leg but had pain in the lower abdominal area Supine heel slides with abdominal contraction 10 x each leg  Exercises: Stretches/mobility: Thread the needle holding 15 sec 2 times each side Tried childs pose but increased pain in the back and lower abdomen One knee on yoga block moving side to side, move forward and back then cat cow each side Self-care: Educated patient on how to lay on a ball or in sitting to massage areas of pain. Patient had return demonstration.     04/06/24 Manual: Internal pelvic floor techniques: No emotional/communication barriers or cognitive limitation. Patient is motivated to learn. Patient understands and agrees with treatment goals and plan. PT explains patient will be examined in standing, sitting, and lying down to see how their muscles and joints work. When they are ready, they will be asked to remove their underwear so PT can examine their perineum. The patient is also given the option of providing their own chaperone as one is not provided in our facility. The patient also has the right and is explained the right to defer or refuse any part of the evaluation or treatment including the internal exam. With the patient's consent, PT will use one gloved finger to gently assess the muscles of the pelvic floor, seeing how well it contracts and relaxes and if there is muscle symmetry. After, the patient will get dressed and PT and patient will discuss exam findings and plan of care. PT and patient discuss plan of care, schedule, attendance policy and HEP activities.  Therapist  gloved finger in the vaginal canal working on the levator ani, obturator internist, along the ischiocavernosus, bulbocavernosus, and perineal body to elongate the muscle and reduce trigger points Neuromuscular re-education: Down training: Therapist gloved finger in the vaginal canal feeling the pelvic floor relax with several tries. She continues to need verbal cues on how to relax the pelvic floor.     03/20/24 Manual: Myofascial release: Fascial release of the urogenital diaphragm going through the different levels Internal pelvic floor techniques: No emotional/communication barriers or cognitive limitation. Patient is motivated to learn. Patient understands and agrees with treatment goals and plan. PT explains patient will be examined in standing, sitting, and lying down to see how their muscles and joints work. When they are ready, they will be asked to remove their underwear so PT can examine their perineum. The patient is also given the option of providing their own chaperone as one is not provided in our facility. The patient also has the right and is explained the right to defer or refuse any part of the evaluation or treatment including the internal exam. With the patient's consent, PT will use one gloved finger to gently assess the muscles of the pelvic floor, seeing how well it contracts and relaxes and if there is muscle symmetry. After, the patient will get dressed and PT and patient will discuss exam findings and plan of care. PT and patient discuss plan of care, schedule, attendance policy and HEP activities.  Therapist gloved finger performing manual work to the right levator ani, ischiocavernosus, pubovaginalis, urethra sphincter and obturator internist to reduce trigger points     PATIENT EDUCATION:  01/12/24 Education details: Access Code: [email or web address], educated  patient on dry needling.  Person educated: Patient Education method: Explanation, Demonstration, Tactile cues, Verbal cues,  and Handouts Education comprehension: verbalized understanding, returned demonstration, verbal cues required, tactile cues required, and needs further education  HOME EXERCISE PROGRAM: 12/13/23 Access Code: [email or web address] URL: [email or web address] Date: 12/20/2023 Prepared by: Channing Pereyra  Exercises - Seated Piriformis Stretch with Trunk Bend  - 1 x daily - 7 x weekly - 1 sets - 1 reps - 30 sec hold - Seated Hamstring Stretch  - 1 x daily - 7 x weekly - 1 sets - 1 reps - 30 sec hold - Seated Happy Baby With Trunk Flexion For Pelvic Relaxation  - 1 x daily - 7 x weekly - 1 sets - 1 reps - 30 sec hold - Cat Cow  - 1 x daily - 7 x weekly - 1 sets - 10 reps - Diaphragmatic Breathing in Child's Pose with Pelvic Floor Relaxation  - 1 x daily - 7 x weekly - 1 sets - 5 reps - 10 sec hold - Supine Diaphragmatic Breathing  - 1 x daily - 7 x weekly - 1 sets - 10 reps - Sidelying Thoracic Rotation with Open Book  - 1 x daily - 2 x weekly - 1 sets - 10 reps - Diaphragmatic Breathing with Hips Elevated (for Pelvic Organ Prolapse)  - 1 x daily - 7 x weekly - 3 sets - 10 reps - Bent Knee Fallouts  - 1 x daily - 3 x weekly - 2 sets - 10 reps - Supine Transversus Abdominis Bracing with Heel Slide  - 1 x daily - 3 x weekly - 2 sets - 10 reps - Diaphragmatic Breathing with Hips Elevated (for Pelvic Organ Prolapse)  - 2 x daily - 7 x weekly - 1 sets - 10 reps - Supine Bridge with Pelvic Floor Contraction  - 1 x daily - 3 x weekly - 1 sets - 10 reps - Quadruped Leg Lifts  - 1 x daily - 3 x weekly - 2 sets - 10 reps - Dead Bug  - 1 x daily - 3 x weekly - 2 sets - 10 reps - Sidelying Hip Adduction  - 1 x daily - 3 x weekly - 2 sets - 10 reps - Sidelying Pelvic Floor Contraction with Hip Abduction  - 1 x daily - 3 x weekly - 2 sets - 10 reps    ASSESSMENT:  CLINICAL IMPRESSION: Patient is a 43 y.o. female who was seen today for physical therapy  treatment for pelvic floor dysfunction. Patient has  trouble controlling her pain. She has been menstruating the last several days so made it difficult to exercise. She will have pain in the lower abdominal area. Not able to do internal work due to her menstruating. Reviewed self massage techniques to work on pain control.  Patient will benefit from skilled therapy to reduce her pain to restore her function.    OBJECTIVE IMPAIRMENTS: decreased activity tolerance, decreased strength, increased fascial restrictions, increased muscle spasms, and pain.   ACTIVITY LIMITATIONS: lifting and standing  PARTICIPATION LIMITATIONS: cleaning, laundry, shopping, community activity, and occupation  PERSONAL FACTORS: Fitness are also affecting patient's functional outcome.   REHAB POTENTIAL: Excellent  CLINICAL DECISION MAKING: Stable/uncomplicated  EVALUATION COMPLEXITY: Low   GOALS: Goals reviewed with patient? Yes  SHORT TERM GOALS: Target date: 09/26/23  Patient independent with initial HEP for flexibility and diaphragmatic  breathing.  Baseline: Goal status: Met 10/04/23  2.  Patient educated on  correct lifting technique to reduce strain on the pelvic floor.  Baseline:  Goal status: met 11/15/23  3.  Patient educated on perineal massage to reduce trigger points.  Baseline: educated patient on vaginal wand Goal status: Met 11/15/23   LONG TERM GOALS: Target date: 09/03/24  Patient independent with advanced HEP for core and pelvic floor strength.  Baseline: continues to learn as she progresses Goal status: ongoing 03/06/24  2.  Patient able to lift 20# with minimal to no pain due to reduction of trigger points in the pelvic floor.  Baseline: lifting limit 20 # Goal status: ongoing 03/06/24  3.  Patient reports minimal to no pain after penile penetration due to the ability to lengthen the pelvic floor.  Baseline: pain level 6/10 Goal status: defer due to not appropriate at this time   4.  Patient reports her lower abdominal pain decreased  </= 0-1/10 with standing 1-2 hours at work due to improve strength of the core and hips.  Baseline: pain level 6/10 Goal status: ongoing 07/31/24   PLAN:  PT FREQUENCY: 1x/week  PT DURATION: 6 months  PLANNED INTERVENTIONS: 97110-Therapeutic exercises, 97530- Therapeutic activity, [id number]- Neuromuscular re-education, 97140- Manual therapy, 97014- Electrical stimulation (unattended), 97035- Ultrasound, Patient/Family education, Taping, Dry Needling, Cryotherapy, Moist heat, and Biofeedback  PLAN FOR NEXT SESSION:  internal  pelvic floor work,  lifting, core strength,  write renewal and update goals.   Channing Pereyra, PT 08/09/24 12:53 PM

## 2024-08-14 ENCOUNTER — Encounter: Payer: Self-pay | Admitting: Physical Therapy

## 2024-08-15 ENCOUNTER — Telehealth: Payer: Self-pay

## 2024-08-15 NOTE — Telephone Encounter (Signed)
 Fax received for long term disability. Left message for patient we wouldn't be able to complete and to have this sent to her PCP

## 2024-08-23 ENCOUNTER — Encounter: Payer: Self-pay | Admitting: Physical Therapy

## 2024-08-24 ENCOUNTER — Ambulatory Visit: Admitting: Obstetrics and Gynecology

## 2024-08-30 ENCOUNTER — Encounter: Payer: Self-pay | Admitting: Physical Therapy

## 2024-08-30 ENCOUNTER — Encounter: Payer: Self-pay | Admitting: Obstetrics and Gynecology

## 2024-08-30 ENCOUNTER — Ambulatory Visit: Admitting: Obstetrics and Gynecology

## 2024-08-30 VITALS — BP 144/81 | HR 69

## 2024-08-30 DIAGNOSIS — R102 Pelvic and perineal pain unspecified side: Secondary | ICD-10-CM

## 2024-08-30 DIAGNOSIS — M6281 Muscle weakness (generalized): Secondary | ICD-10-CM | POA: Insufficient documentation

## 2024-08-30 DIAGNOSIS — R1030 Lower abdominal pain, unspecified: Secondary | ICD-10-CM

## 2024-08-30 DIAGNOSIS — M62838 Other muscle spasm: Secondary | ICD-10-CM

## 2024-08-30 DIAGNOSIS — R252 Cramp and spasm: Secondary | ICD-10-CM | POA: Insufficient documentation

## 2024-08-30 DIAGNOSIS — N3281 Overactive bladder: Secondary | ICD-10-CM | POA: Insufficient documentation

## 2024-08-30 DIAGNOSIS — N393 Stress incontinence (female) (male): Secondary | ICD-10-CM

## 2024-08-30 MED ORDER — METHOCARBAMOL 500 MG PO TABS: 500.0000 mg | ORAL_TABLET | Freq: Three times a day (TID) | ORAL | 5 refills | Status: AC | PRN

## 2024-08-30 NOTE — Therapy (Signed)
 OUTPATIENT PHYSICAL THERAPY FEMALE PELVIC TREATMENT   Patient Name: Connie Carlson MRN: [id number] DOB:12/17/1980, 43 y.o., female Today's Date: 08/30/2024  END OF SESSION:  PT End of Session - 08/30/24 1407     Visit Number 20    Date for Recertification  01/02/25    Authorization Type [employer]    Authorization - Visit Number 20    Authorization - Number of Visits 90    PT Start Time 1400    PT Stop Time 1445    PT Time Calculation (min) 45 min    Activity Tolerance Patient tolerated treatment well    Behavior During Therapy [facility] for tasks assessed/performed          Past Medical History:  Diagnosis Date   Depression    High blood pressure    Migraine    Past Surgical History:  Procedure Laterality Date   CHOLECYSTECTOMY     mole remove     under right breast by [facility] - skin cancer   Patient Active Problem List   Diagnosis Date Noted   Overactive bladder 08/30/2024   Levator spasm 07/24/2024   SUI (stress urinary incontinence, female) 07/24/2024   Urinary frequency 07/24/2024   Other insomnia 03/30/2023   Generalized obesity 03/30/2023   BMI 37.0-37.9, adult 03/30/2023   Elevated glucose 03/30/2023   Vitamin D  deficiency 03/30/2023   Health care maintenance 03/30/2023   SOB (shortness of breath) on exertion 03/30/2023   Other fatigue 03/30/2023   Major depressive disorder, recurrent episode, mild 08/26/2022   Stress and adjustment reaction 06/29/2022   Anal fissure 01/13/2022   Depression, recurrent 04/07/2021   Hypertension 06/11/2020   Class 3 severe obesity due to excess calories with body mass index (BMI) of 45.0 to 49.9 in adult (HCC) 06/11/2020   IFG (impaired fasting glucose) 10/08/2013   Migraine headache without aura 02/16/2013   WEIGHT GAIN 01/01/2009   Allergic rhinitis 06/28/2006   GASTROESOPHAGEAL REFLUX, NO ESOPHAGITIS 06/28/2006    PCP: Alvan Dorothyann BIRCH, MD  REFERRING PROVIDER: Erik Kieth JAYSON, MD   REFERRING  DIAG: M62.89 (ICD-10-CM) - Pelvic floor dysfunction   THERAPY DIAG:  Cramp and spasm - Plan: PT plan of care cert/re-cert  Pelvic pain - Plan: PT plan of care cert/re-cert  Muscle weakness (generalized) - Plan: PT plan of care cert/re-cert  Lower abdominal pain - Plan: PT plan of care cert/re-cert  Rationale for Evaluation and Treatment: Rehabilitation  ONSET DATE: 1/23  SUBJECTIVE:  SUBJECTIVE STATEMENT:  I have started the vaginal relaxer. It has trigger my migraines. I would have the spasms fewer but the intensity increased. The bleeding continues.    PAIN:  Are you having pain? Yes NPRS scale: 7/10; 11/22/23 12/13/23 5/10 12/20/23: pain level is 5/10 01/12/24: 7/10 after cleaning in house and after doing the wand 02/02/24: pain 5/10 03/06/24: pain level is 5/10 03/20/24:  pain level recently with activity is 10/10 and average pain is 5/10 04/06/24: pain level 8/10 07/31/24: pain level 6/10.  08/09/24: pain level 7/10  Pain location: lower abdominal area  Pain type: pressure, like something is going to come out of the vagina Pain description: intermittent   Aggravating factors: lifting heavy items, standing several hours, carrying and bending over Relieving factors: Advil  PRECAUTIONS: None  RED FLAGS: None   WEIGHT BEARING RESTRICTIONS: No  FALLS:  Has patient fallen in last 6 months? No  LIVING ENVIRONMENT: Lives with: lives with their family  OCCUPATION: works at [employer] with a lot of lifting.   PLOF: Independent  PATIENT GOALS: reduce pain  PERTINENT HISTORY:  Cholecystectomy Sexual abuse: No  BOWEL MOVEMENT: no issues  URINATION: Pain with urination: No Fully empty bladder: Yes:   Stream: Strong Urgency: No Frequency: average Leakage: Coughing, sneeze,  laughing 12/13/23: leakage 30 % better.  12/20/23: urinary leakage is 40% better 01/12/24: leak with sneeze, no leakage with laughing 03/06/24: leak urine only when she sneezes 07/31/24: leaks urine with laugh loud, cough, sneeze. Night time wakes up 2 times per night.  08/30/24: leaks with urine with laugh, cough, and sneeze but smaller amount. Night time wakes up 2 x compared to 6 times. Urinary leakage is 60% better.  Pads: No  INTERCOURSE: Pain with intercourse: After Intercourse, 10/10 Ability to have vaginal penetration:  Yes:   Climax: not when having pain Marinoff Scale: 2/3  PREGNANCY: Vaginal deliveries 1 Tearing No  OBJECTIVE:  Note: Objective measures were completed at Evaluation unless otherwise noted.  DIAGNOSTIC FINDINGS:  Ultrasound negative; biopsy is negative   COGNITION: Overall cognitive status: Within functional limits for tasks assessed     SENSATION: Light touch: Appears intact Proprioception: Appears intact   POSTURE: No Significant postural limitations  PELVIC ALIGNMENT:  LUMBARAROM/PROM: full lumbar ROM 07/31/24: thoracic rotation right had pain the right thoracic area.    LOWER EXTREMITY ROM: full bilateral hip ROM   LOWER EXTREMITY MMT: Bilateral hip strength is 4/5.    PALPATION:   General  tenderness located in the lower abdomen, decreased movement of the lower rib cage, good mobility of the diaphragm, tenderness in the buttocks                External Perineal Exam tenderness located in the bulbocavernosus, ischiocavernosus, perineal body                             Internal Pelvic Floor levator ani, obturator internist, side of the introitus, sides of the bladder  Patient confirms identification and approves PT to assess internal pelvic floor and treatment Yes  PELVIC MMT:   MMT eval 10/11/23 01/12/24 03/20/24  Vaginal 3/5 holding for 6 seconds 3/5 3/5 with difficulty recruiting 4/5 holding 3 seconds  (Blank rows = not tested)         TONE: increased  PROLAPSE: None 12/13/23: when patient bears down in supine anterior wall weakness to the introitus   TODAY'S TREATMENT:  08/30/24 Exercises: Stretches/mobility: Sitting  with legs apart holding 30 sec.  Sitting hamstring stretch on floor holding 30 sec each Childs pose holding for 30 sec.  Thread the needle hold 15 sec 2 time each way Windmill stretch 10x Strengthening: Bridges 15 x  Supine with towel roll under pelvis hip flexion with core engaged 20 x each leg Supine alternate shoulder flexion holding 3 # 20 x  Lift opposite arm and leg in quadruped 20 x  Self-care: Educated patient on using the  massage gun on the area that has tenderness to relax the muscles Educated patient on how to use the massage roller on the muscles to reduce her pain    08/09/24 Manual: Soft tissue mobilization: Manual work to the hip adductors and quadriceps.  Neuromuscular re-education: Core retraining: Supine transverse abdominus 10 x  Bridge with thighs on leg rest 15 x  Supine press palms into thighs to engage the lower abdomen 15 x each Supine pull the band to the other side to work on the obl Down training: Sitting on ball and massage the pelvic floor muscles to reduce pain.  Foam roll to the hip adductors     07/31/24 Neuromuscular re-education: Core retraining: Bridge 15 x Transverse abdominus contraction supine 20 x Supine rotation pulling arms on green band.  Quadruped lift opposite arm and leg but had pain in the lower abdominal area Supine heel slides with abdominal contraction 10 x each leg  Exercises: Stretches/mobility: Thread the needle holding 15 sec 2 times each side Tried childs pose but increased pain in the back and lower abdomen One knee on yoga block moving side to side, move forward and back then cat cow each side Self-care: Educated patient on how to lay on a ball or in sitting to massage areas of pain. Patient had return demonstration.         PATIENT EDUCATION:  01/12/24 Education details: Access Code: [email or web address], educated patient on dry needling.  Person educated: Patient Education method: Explanation, Demonstration, Tactile cues, Verbal cues, and Handouts Education comprehension: verbalized understanding, returned demonstration, verbal cues required, tactile cues required, and needs further education  HOME EXERCISE PROGRAM: 12/13/23 Access Code: [email or web address] URL: [email or web address] Date: 12/20/2023 Prepared by: Channing Pereyra  Exercises - Seated Piriformis Stretch with Trunk Bend  - 1 x daily - 7 x weekly - 1 sets - 1 reps - 30 sec hold - Seated Hamstring Stretch  - 1 x daily - 7 x weekly - 1 sets - 1 reps - 30 sec hold - Seated Happy Baby With Trunk Flexion For Pelvic Relaxation  - 1 x daily - 7 x weekly - 1 sets - 1 reps - 30 sec hold - Cat Cow  - 1 x daily - 7 x weekly - 1 sets - 10 reps - Diaphragmatic Breathing in Child's Pose with Pelvic Floor Relaxation  - 1 x daily - 7 x weekly - 1 sets - 5 reps - 10 sec hold - Supine Diaphragmatic Breathing  - 1 x daily - 7 x weekly - 1 sets - 10 reps - Sidelying Thoracic Rotation with Open Book  - 1 x daily - 2 x weekly - 1 sets - 10 reps - Diaphragmatic Breathing with Hips Elevated (for Pelvic Organ Prolapse)  - 1 x daily - 7 x weekly - 3 sets - 10 reps - Bent Knee Fallouts  - 1 x daily - 3 x weekly - 2 sets - 10 reps - Supine Transversus Abdominis Bracing with Heel Slide  - 1  x daily - 3 x weekly - 2 sets - 10 reps - Diaphragmatic Breathing with Hips Elevated (for Pelvic Organ Prolapse)  - 2 x daily - 7 x weekly - 1 sets - 10 reps - Supine Bridge with Pelvic Floor Contraction  - 1 x daily - 3 x weekly - 1 sets - 10 reps - Quadruped Leg Lifts  - 1 x daily - 3 x weekly - 2 sets - 10 reps - Dead Bug  - 1 x daily - 3 x weekly - 2 sets - 10 reps - Sidelying Hip Adduction  - 1 x daily - 3 x weekly - 2 sets - 10 reps - Sidelying Pelvic Floor Contraction with Hip  Abduction  - 1 x daily - 3 x weekly - 2 sets - 10 reps    ASSESSMENT:  CLINICAL IMPRESSION: Patient is a 43 y.o. female who was seen today for physical therapy  treatment for pelvic floor dysfunction. Patient is still ut of work due to her issues. Patient leaks with urine with laugh, cough, and sneeze but smaller amount. Night time wakes up 2 x compared to 6 times. Urinary leakage is 60% better. Patient reports pain level 8/10 but less frequent times. Patient does her exercise program 3 times per week. Patient is not straining to urinate. Patient continues to have the pulling in her vaginal and abdominal area with lifting items. Patient will benefit from skilled therapy to reduce her pain to restore her function.    OBJECTIVE IMPAIRMENTS: decreased activity tolerance, decreased strength, increased fascial restrictions, increased muscle spasms, and pain.   ACTIVITY LIMITATIONS: lifting and standing  PARTICIPATION LIMITATIONS: cleaning, laundry, shopping, community activity, and occupation  PERSONAL FACTORS: Fitness are also affecting patient's functional outcome.   REHAB POTENTIAL: Excellent  CLINICAL DECISION MAKING: Stable/uncomplicated  EVALUATION COMPLEXITY: Low   GOALS: Goals reviewed with patient? Yes  SHORT TERM GOALS: Target date: 09/26/23  Patient independent with initial HEP for flexibility and diaphragmatic  breathing.  Baseline: Goal status: Met 10/04/23  2.  Patient educated on correct lifting technique to reduce strain on the pelvic floor.  Baseline:  Goal status: met 11/15/23  3.  Patient educated on perineal massage to reduce trigger points.  Baseline: educated patient on vaginal wand Goal status: Met 11/15/23   LONG TERM GOALS: Target date: 01/02/25  Patient independent with advanced HEP for core and pelvic floor strength.  Baseline: continues to learn as she progresses Goal status: ongoing 08/30/24  2.  Patient able to lift 20# with minimal to no pain due to  reduction of trigger points in the pelvic floor.  Baseline: lifting limit 20 # Goal status: ongoing 08/30/24  3.  Patient reports minimal to no pain after penile penetration due to the ability to lengthen the pelvic floor.  Baseline: pain level 6/10 Goal status: defer due to not appropriate at this time   4.  Patient reports her lower abdominal pain decreased </= 0-1/10 with standing 1-2 hours at work due to improve strength of the core and hips.  Baseline: pain level 8/10 but less frequent times Goal status: ongoing 08/30/24  5.  Patient able to lift 15 # with minimal to no pulling pain in the lower abdomen to be able to do home tasks.  Baseline:  Goal status: INITIAL 08/30/24    PLAN:  PT FREQUENCY: 1x/week  PT DURATION: 4 months sessions  PLANNED INTERVENTIONS: 97110-Therapeutic exercises, 97530- Therapeutic activity, [id number]- Neuromuscular re-education, 97140- Manual therapy, 97014- Electrical stimulation (  unattended), [id number]- Ultrasound, Patient/Family education, Taping, Dry Needling, Cryotherapy, Moist heat, and Biofeedback  PLAN FOR NEXT SESSION:  internal  pelvic floor work,  lifting, core strength  Channing Pereyra, PT 08/30/2024 2:58 PM

## 2024-08-30 NOTE — Progress Notes (Signed)
 [facility] Return Visit  SUBJECTIVE  History of Present Illness: Connie Carlson is a 43 y.o. female seen in follow-up for OAB, SUI and pelvic pain/ levator spasm. She was started on vaginal valium  last visit. She has also restarted pelvic PT.  Has been on OCPs but has been bleeding every day a small amount. Has appt with Dr Erik next week.   She has tried the valium  and it has decreased some of the muscle spasm to a 7/10 but still present.   She cut back on the water and has noticed a little less frequently, now 7-8 times per day. She does not leak with urgency and does not have a lot of urgency.   She still has SUI symptoms but is not interested in urethral bulking at this time.   Past Medical History: Patient  has a past medical history of Depression, High blood pressure, and Migraine.   Past Surgical History: She  has a past surgical history that includes Cholecystectomy and mole remove.   Medications: She has a current medication list which includes the following prescription(s): diazepam , duloxetine , methocarbamol, simpesse, ubrelvy , and valsartan -hydrochlorothiazide .   Allergies: Patient has no known allergies.   Social History: Patient  reports that she has been smoking cigarettes. She has never used smokeless tobacco. She reports current alcohol use. She reports that she does not use drugs.     OBJECTIVE     Physical Exam: Vitals:   08/30/24 0827  BP: (!) 144/81  Pulse: 69   Gen: No apparent distress, A&O x 3.  Detailed Urogynecologic Evaluation:  Deferred.    ASSESSMENT AND PLAN    Connie Carlson is a 43 y.o. with:  1. Levator spasm   2. Overactive bladder   3. SUI (stress urinary incontinence, female)     Levator spasm Assessment & Plan: - Reviewed option of pelvic floor trigger point injections. She may be interested in the future but is a little nervous about it now.  - Little improvement with valium  and flexeril  previously. Can try  methocarbamol 500mg  TID prn.   Orders: -     Methocarbamol; Take 1 tablet (500 mg total) by mouth every 8 (eight) hours as needed for muscle spasms.  Dispense: 60 tablet; Refill: 5  Overactive bladder Assessment & Plan: - She feels she has had improvement with monitoring her fluid intake and does not desire further intervention at this time.    SUI (stress urinary incontinence, female) Assessment & Plan: - Tried pessary and not interested in urethral bulking at this time. Continue with pelvic PT.     Follow up 2 months   Connie LOISE Caper, MD

## 2024-08-30 NOTE — Assessment & Plan Note (Signed)
-   She feels she has had improvement with monitoring her fluid intake and does not desire further intervention at this time.

## 2024-08-30 NOTE — Assessment & Plan Note (Signed)
-   Reviewed option of pelvic floor trigger point injections. She may be interested in the future but is a little nervous about it now.  - Little improvement with valium  and flexeril  previously. Can try methocarbamol 500mg  TID prn.

## 2024-08-30 NOTE — Assessment & Plan Note (Signed)
-   Tried pessary and not interested in urethral bulking at this time. Continue with pelvic PT.

## 2024-08-31 ENCOUNTER — Ambulatory Visit: Payer: Self-pay | Admitting: Professional

## 2024-09-05 ENCOUNTER — Encounter: Payer: Self-pay | Admitting: Obstetrics and Gynecology

## 2024-09-05 ENCOUNTER — Ambulatory Visit: Admitting: Obstetrics and Gynecology

## 2024-09-05 VITALS — BP 130/89 | HR 103 | Ht 61.0 in | Wt 248.1 lb

## 2024-09-05 DIAGNOSIS — R102 Pelvic and perineal pain unspecified side: Secondary | ICD-10-CM | POA: Diagnosis not present

## 2024-09-05 DIAGNOSIS — M7918 Myalgia, other site: Secondary | ICD-10-CM

## 2024-09-05 DIAGNOSIS — G8929 Other chronic pain: Secondary | ICD-10-CM

## 2024-09-05 NOTE — Progress Notes (Signed)
° °  RETURN GYNECOLOGY VISIT  Subjective:  Connie Carlson is a 43 y.o. G1P1001 with LMP 08/10/24 presenting for follow up of myofascial pelvic pain.   Following with me for > 1 year for chronic pelvic pain likely myofascial in origin. She has had normal imaging. She is working with PFPT regularly (last appt 08/30/24) and now with urogyn (last appt 08/30/24). She tried vaginal valium  without improvement, so recently got prescribed robaxin  that she plans to try. Her pain has improved from our initial appointments. It is less frequent, but is still very intense. She is taking ibuprofen 600mg  BID. She is planning pelvic floor injections with Dr. Marilynne at her follow up appointment in 2 months.   She has been on long term disability while working through these issues because she has a job at [employer] and despite her job involving limited lifting, they are not able to accommodate any lifting restrictions. She reports that she needs to go back to work by Jan 22nd 2026 to maintain benefits.   Objective:   Vitals:   09/05/24 0929  BP: 130/89  Pulse: (!) 103  Weight: 248 lb 1.9 oz (112.5 kg)  Height: 5' 1 (1.549 m)   General:  Alert, oriented and cooperative. Patient is in no acute distress.  Skin: Skin is warm and dry. No rash noted.   Cardiovascular: Normal heart rate noted  Respiratory: Normal respiratory effort, no problems with respiration noted   Assessment and Plan:  Connie Carlson is a 43 y.o. with myofascial pelvic pain  She is consistently improving and is working with 2 specialists (urogyn, PFPT) on this issue She may return to work without restriction on Jan 20th Follow up with PFPT and urogyn as scheduled   Future Appointments  Date Time Provider Department Center  09/18/2024  1:00 PM Elnor Channing FALCON, PT [facility]  09/28/2024  8:00 AM [facility], [facility] None  10/12/2024  8:00 AM [facility], [facility] None  10/26/2024  8:00 AM [facility], [facility] None   10/29/2024 10:20 AM Marilynne Rosaline SAILOR, MD [facility]   Kieth JAYSON Carolin, MD

## 2024-09-14 ENCOUNTER — Other Ambulatory Visit: Payer: Self-pay | Admitting: Family Medicine

## 2024-09-14 DIAGNOSIS — I10 Essential (primary) hypertension: Secondary | ICD-10-CM

## 2024-09-15 ENCOUNTER — Other Ambulatory Visit: Payer: Self-pay | Admitting: Family Medicine

## 2024-09-15 DIAGNOSIS — N921 Excessive and frequent menstruation with irregular cycle: Secondary | ICD-10-CM

## 2024-09-18 ENCOUNTER — Telehealth: Payer: Self-pay | Admitting: Physical Therapy

## 2024-09-18 ENCOUNTER — Encounter: Payer: Self-pay | Admitting: Physical Therapy

## 2024-09-18 NOTE — Telephone Encounter (Signed)
 Called patient about her missed appointment today at 13:00. Left a message.  Channing Pereyra, PT @12 /30/25@ 1:22 PM

## 2024-09-28 ENCOUNTER — Ambulatory Visit: Payer: Self-pay | Admitting: Professional

## 2024-10-03 ENCOUNTER — Telehealth: Payer: Self-pay

## 2024-10-03 NOTE — Telephone Encounter (Signed)
 RN returned patient call regarding need for letter for going back to work to state okay to return to work on 10/09/24 with restrictions of working 5 hours a day for 5 days a week for the first 3 weeks back to work. RN reported would reach out to provider regarding request. Message sent to provider.  Silvano LELON Piano, RN

## 2024-10-12 ENCOUNTER — Ambulatory Visit: Payer: Self-pay | Admitting: Professional

## 2024-10-26 ENCOUNTER — Ambulatory Visit: Payer: Self-pay | Admitting: Professional

## 2024-10-29 ENCOUNTER — Ambulatory Visit: Payer: Self-pay | Admitting: Obstetrics and Gynecology

## 2024-11-09 ENCOUNTER — Ambulatory Visit: Payer: Self-pay | Admitting: Professional

## 2024-11-23 ENCOUNTER — Ambulatory Visit: Payer: Self-pay | Admitting: Professional

## 2024-12-07 ENCOUNTER — Ambulatory Visit: Payer: Self-pay | Admitting: Professional
